# Patient Record
Sex: Male | Born: 1950 | Race: White | Hispanic: No | Marital: Married | Smoking: Former smoker
Health system: Southern US, Community
[De-identification: ages and names within clinical notes are randomized; demographics above are authoritative.]

## PROBLEM LIST (undated history)

## (undated) DIAGNOSIS — E785 Hyperlipidemia, unspecified: Secondary | ICD-10-CM

## (undated) DIAGNOSIS — E119 Type 2 diabetes mellitus without complications: Secondary | ICD-10-CM

## (undated) DIAGNOSIS — N200 Calculus of kidney: Secondary | ICD-10-CM

## (undated) DIAGNOSIS — I872 Venous insufficiency (chronic) (peripheral): Secondary | ICD-10-CM

## (undated) DIAGNOSIS — I10 Essential (primary) hypertension: Secondary | ICD-10-CM

## (undated) DIAGNOSIS — E669 Obesity, unspecified: Secondary | ICD-10-CM

## (undated) DIAGNOSIS — R55 Syncope and collapse: Secondary | ICD-10-CM

## (undated) DIAGNOSIS — I350 Nonrheumatic aortic (valve) stenosis: Secondary | ICD-10-CM

## (undated) DIAGNOSIS — M1990 Unspecified osteoarthritis, unspecified site: Secondary | ICD-10-CM

## (undated) DIAGNOSIS — I2510 Atherosclerotic heart disease of native coronary artery without angina pectoris: Secondary | ICD-10-CM

## (undated) DIAGNOSIS — G4730 Sleep apnea, unspecified: Secondary | ICD-10-CM

## (undated) HISTORY — PX: CATARACT EXTRACTION, BILATERAL: SHX1313

## (undated) HISTORY — DX: Unspecified osteoarthritis, unspecified site: M19.90

## (undated) HISTORY — DX: Obesity, unspecified: E66.9

## (undated) HISTORY — DX: Type 2 diabetes mellitus without complications: E11.9

## (undated) HISTORY — PX: APPENDECTOMY: SHX54

## (undated) HISTORY — DX: Venous insufficiency (chronic) (peripheral): I87.2

## (undated) HISTORY — DX: Hyperlipidemia, unspecified: E78.5

## (undated) HISTORY — DX: Sleep apnea, unspecified: G47.30

## (undated) HISTORY — PX: VASECTOMY REVERSAL: SHX243

## (undated) HISTORY — PX: VASECTOMY: SHX75

## (undated) HISTORY — DX: Calculus of kidney: N20.0

## (undated) HISTORY — DX: Nonrheumatic aortic (valve) stenosis: I35.0

## (undated) HISTORY — DX: Essential (primary) hypertension: I10

---

## 2003-07-17 ENCOUNTER — Other Ambulatory Visit: Payer: Self-pay

## 2004-03-18 ENCOUNTER — Ambulatory Visit: Payer: Self-pay | Admitting: Ophthalmology

## 2004-05-06 ENCOUNTER — Ambulatory Visit: Payer: Self-pay | Admitting: Ophthalmology

## 2005-06-04 ENCOUNTER — Ambulatory Visit: Payer: Self-pay | Admitting: Urology

## 2007-06-03 ENCOUNTER — Ambulatory Visit: Payer: Self-pay | Admitting: Urology

## 2007-06-09 ENCOUNTER — Other Ambulatory Visit: Payer: Self-pay

## 2007-06-09 ENCOUNTER — Ambulatory Visit: Payer: Self-pay | Admitting: Urology

## 2007-06-10 ENCOUNTER — Ambulatory Visit: Payer: Self-pay | Admitting: Urology

## 2007-06-18 ENCOUNTER — Ambulatory Visit: Payer: Self-pay | Admitting: Urology

## 2007-11-26 ENCOUNTER — Ambulatory Visit: Payer: Self-pay | Admitting: Urology

## 2008-04-07 ENCOUNTER — Ambulatory Visit: Payer: Self-pay | Admitting: Internal Medicine

## 2008-06-02 ENCOUNTER — Ambulatory Visit: Payer: Self-pay | Admitting: Internal Medicine

## 2008-09-08 ENCOUNTER — Ambulatory Visit: Payer: Self-pay | Admitting: Urology

## 2009-03-23 ENCOUNTER — Ambulatory Visit: Payer: Self-pay | Admitting: Unknown Physician Specialty

## 2010-03-08 ENCOUNTER — Ambulatory Visit: Payer: Self-pay | Admitting: Urology

## 2011-03-07 ENCOUNTER — Ambulatory Visit: Payer: Self-pay | Admitting: Internal Medicine

## 2012-01-20 ENCOUNTER — Encounter: Payer: Self-pay | Admitting: Cardiovascular Disease

## 2012-01-20 ENCOUNTER — Ambulatory Visit (INDEPENDENT_AMBULATORY_CARE_PROVIDER_SITE_OTHER): Payer: 59 | Admitting: Cardiovascular Disease

## 2012-01-20 VITALS — BP 162/88 | HR 78 | Ht 72.0 in | Wt 282.5 lb

## 2012-01-20 DIAGNOSIS — I350 Nonrheumatic aortic (valve) stenosis: Secondary | ICD-10-CM | POA: Insufficient documentation

## 2012-01-20 DIAGNOSIS — Z5181 Encounter for therapeutic drug level monitoring: Secondary | ICD-10-CM

## 2012-01-20 DIAGNOSIS — Z01810 Encounter for preprocedural cardiovascular examination: Secondary | ICD-10-CM

## 2012-01-20 DIAGNOSIS — R0789 Other chest pain: Secondary | ICD-10-CM

## 2012-01-20 DIAGNOSIS — Z01818 Encounter for other preprocedural examination: Secondary | ICD-10-CM

## 2012-01-20 DIAGNOSIS — I359 Nonrheumatic aortic valve disorder, unspecified: Secondary | ICD-10-CM

## 2012-01-20 DIAGNOSIS — I209 Angina pectoris, unspecified: Secondary | ICD-10-CM | POA: Insufficient documentation

## 2012-01-20 MED ORDER — PREDNISONE 50 MG PO TABS: ORAL_TABLET | ORAL | Status: DC

## 2012-01-20 NOTE — Patient Instructions (Addendum)
Your physician has requested that you have a cardiac catheterization. Cardiac catheterization is used to diagnose and/or treat various heart conditions. Doctors may recommend this procedure for a number of different reasons. The most common reason is to evaluate chest pain. Chest pain can be a symptom of coronary artery disease (CAD), and cardiac catheterization can show whether plaque is narrowing or blocking your heart's arteries. This procedure is also used to evaluate the valves, as well as measure the blood flow and oxygen levels in different parts of your heart. For further information please visit [email or web address] Please follow instruction sheet, as given.  [facility] Cardiac Cath Instructions   You are scheduled for a Cardiac Cath on: Friday Dec. 6th.  Please arrive at __9:45am_____am on the day of your procedure  You will need to pre-register prior to the day of your procedure.  Enter through the [employer] at [facility].  Registration is the first desk on your right.  Please take the procedure order we have given you in order to be registered appropriately  Do not eat/drink anything after midnight  Someone will need to drive you home  It is recommended someone be with you for the first 24 hours after your procedure  Wear clothes that are easy to get on/off and wear slip on shoes if possible   Medications bring a current list of all medications with you  _x__ You may take all of your medications the morning of your procedure with enough water to swallow safely except for the following: see below instructions.  _x__ Do not take these medications before your procedure:_Do not take the (glimepiride) amaryl the am of procedure. Take 1/2 your insulin dose the am of procedure. Do not take metformin the day before or 48 hours after the procedure.   Day of your procedure: Arrive at the [facility] entrance.  Free valet service is available.  After entering the [facility] you will pass on the  right admitting/registration desk, proceed pass the Cardiopulmonary desk to the first open hallway on your right, overhead you will see a sign reading Cardiac Cath./Special Procedures Waiting Room.  After taking the hallway, the waiting room is on the left, once you enter the waiting room, there is a wall phone on the left, just inside the door.  Dial [phone] this will connect you to the Special Recovery Nurse's Station; let them know you have arrived.  The usual length of stay after your procedure is about 2 to 3 hours.  This can vary.  If you have any questions, please call our office at [phone], or you may call the cardiac cath lab at [facility] directly at [phone]

## 2012-01-20 NOTE — Assessment & Plan Note (Signed)
This was mild by echocardiogram early this year. Also the it does not seem to be significant by physical exam.

## 2012-01-20 NOTE — Assessment & Plan Note (Signed)
The patient's symptoms of exertional chest pain and dyspnea with minimal activities are suggestive of class III angina in spite of being on aggressive antianginal medications including full dose carvedilol as well as amlodipine. Due to that, I recommend proceeding with cardiac catheterization and possible coronary intervention. Risks, benefits and alternatives were discussed with the patient. There is no contraindication for long-term dual antiplatelet therapy. He had previous allergic reaction to IV contrast with rash. Thus, I will pretreat him with prednisone.

## 2012-01-20 NOTE — Progress Notes (Signed)
primary care physician: Dr. Beverely Risen  HPI  [name] is a pleasant 61 year old Caucasian male who is here today for evaluation of exertional chest pain. The patient does not have any previous cardiac history. He has multiple chronic medical conditions including diabetes, hypertension, hyperlipidemia, obesity and erectile dysfunction. He also has sleep apnea. He started having exertional substernal chest pain associated with dyspnea about one year ago. However, his symptoms started progressing over the last few months. The patient describes substernal tightness feeling with minimal activities even walking to his mailbox which is not far associated with significant dyspnea which forces him to stop. The discomfort lasts for 5-10 minutes and improves with rest. He has been having increased palpitations as well in spite of being on maximal dose carvedilol. He had a recent Holter monitor done which showed PACs and PVCs without any other arrhythmia. He did have a nuclear stress test done in January of this year which showed fixed inferior wall defect without clear evidence of reversibility. An echocardiogram showed normal LV systolic function with mild aortic stenosis. The patient complains of cramping in his legs with walking it has no history of PAD. He reports being evaluated for this.  Allergies  Allergen Reactions  . Ivp Dye (Iodinated Diagnostic Agents)      Current Outpatient Prescriptions on File Prior to Visit  Medication Sig Dispense Refill  . amitriptyline (ELAVIL) 25 MG tablet Take 25 mg by mouth as directed.      Marland Kitchen amLODipine (NORVASC) 5 MG tablet Take 5 mg by mouth daily.      Marland Kitchen aspirin 81 MG tablet Take 81 mg by mouth daily.      Marland Kitchen atorvastatin (LIPITOR) 10 MG tablet Take 10 mg by mouth daily.      . Blood Glucose Monitoring Suppl (ONE TOUCH ULTRA SYSTEM KIT) W/DEVICE KIT 1 kit by Does not apply route 2 (two) times daily.      . carvedilol (COREG) 25 MG tablet Take 25 mg by mouth 2  (two) times daily with a meal.      . gabapentin (NEURONTIN) 100 MG capsule Take 100 mg by mouth at bedtime.      Marland Kitchen glimepiride (AMARYL) 4 MG tablet Take 4 mg by mouth 2 (two) times daily.      . insulin glargine (LANTUS) 100 UNIT/ML injection Inject 35 Units into the skin daily.      . Insulin Syringe-Needle U-100 (INSULIN SYRINGE .5CC/31GX5/16") 31G X 5/16" 0.5 ML MISC by Does not apply route 2 (two) times daily as needed.      Marland Kitchen losartan-hydrochlorothiazide (HYZAAR) 100-25 MG per tablet Take 1 tablet by mouth daily.      . NON FORMULARY CPAP to be used nightly with oxygen liter.      . Saxagliptin-Metformin (KOMBIGLYZE XR) 2.06-998 MG TB24 Take by mouth 2 (two) times daily.      Marland Kitchen testosterone cypionate (DEPOTESTOTERONE CYPIONATE) 200 MG/ML injection Inject 200 mg into the muscle every 7 (seven) days.         Past Medical History  Diagnosis Date  . Sleep apnea   . Arthritis   . Hyperlipidemia   . Hypertension   . Nephrolithiasis   . Heart murmur   . Diabetes mellitus without complication     Type II  . Aortic stenosis     Mild by echo  . Obesity      Past Surgical History  Procedure Date  . Vasectomy   . Vasectomy reversal   .  Appendectomy   . Cataract extraction, bilateral      Family History  Problem Relation Age of Onset  . Heart disease Mother   . Heart disease Brother      History   Social History  . Marital Status: Married    Spouse Name: N/A    Number of Children: N/A  . Years of Education: N/A   Occupational History  . Not on file.   Social History Main Topics  . Smoking status: Former Smoker -- 3.0 packs/day for 9 years    Types: Cigarettes  . Smokeless tobacco: Not on file  . Alcohol Use: No     Comment: SOCIAL  . Drug Use: No  . Sexually Active:    Other Topics Concern  . Not on file   Social History Narrative  . No narrative on file     ROS Constitutional: Negative for fever, chills, diaphoresis, activity change, appetite change.   HENT: Negative for hearing loss, nosebleeds, congestion, sore throat, facial swelling, drooling, trouble swallowing, neck pain, voice change, sinus pressure and tinnitus.  Eyes: Negative for photophobia, pain, discharge and visual disturbance.  Respiratory: Negative for apnea, cough  and wheezing.  Cardiovascular: Negative for leg swelling.  Gastrointestinal: Negative for nausea, vomiting, abdominal pain, diarrhea, constipation, blood in stool and abdominal distention.  Genitourinary: Negative for dysuria, urgency, frequency, hematuria and decreased urine volume.  Musculoskeletal: Negative for myalgias, back pain, joint swelling, arthralgias and gait problem.  Skin: Negative for color change, pallor, rash and wound.  Neurological: Negative for dizziness, tremors, seizures, syncope, speech difficulty, weakness, light-headedness, numbness and headaches.  Psychiatric/Behavioral: Negative for suicidal ideas, hallucinations, behavioral problems and agitation. The patient is not nervous/anxious.     PHYSICAL EXAM   BP 162/88  Pulse 78  Ht 6' (1.829 m)  Wt 282 lb 8 oz (128.141 kg)  BMI 38.31 kg/m2 Constitutional: He is oriented to person, place, and time. He appears well-developed and well-nourished. No distress.  HENT: No nasal discharge.  Head: Normocephalic and atraumatic.  Eyes: Pupils are equal and round. Right eye exhibits no discharge. Left eye exhibits no discharge.  Neck: Normal range of motion. Neck supple. No JVD present. No thyromegaly present.  Cardiovascular: Normal rate, regular rhythm, normal heart sounds and. Exam reveals no gallop and no friction rub. There is a 2/6 systolic ejection murmur at the aortic area which is early peaking with preserved S2 Pulmonary/Chest: Effort normal and breath sounds normal. No stridor. No respiratory distress. He has no wheezes. He has no rales. He exhibits no tenderness.  Abdominal: Soft. Bowel sounds are normal. He exhibits no distension.  There is no tenderness. There is no rebound and no guarding.  Musculoskeletal: Normal range of motion. He exhibits no edema and no tenderness.  Neurological: He is alert and oriented to person, place, and time. Coordination normal.  Skin: Skin is warm and dry. No rash noted. He is not diaphoretic. No erythema. No pallor.  Psychiatric: He has a normal mood and affect. His behavior is normal. Judgment and thought content normal.       EKG: Sinus  Rhythm  Low voltage in precordial leads.   Voltage criteria for LVH    ABNORMAL    ASSESSMENT AND PLAN

## 2012-01-21 LAB — CBC WITH DIFFERENTIAL/PLATELET
Basophils Absolute: 0 10*3/uL (ref 0.0–0.2)
Hemoglobin: 12.9 g/dL (ref 12.6–17.7)
Lymphs: 28 % (ref 14–46)
MCHC: 35.7 g/dL (ref 31.5–35.7)
Monocytes: 8 % (ref 4–12)
Neutrophils Absolute: 4.1 10*3/uL (ref 1.4–7.0)
RBC: 4.33 x10E6/uL (ref 4.14–5.80)

## 2012-01-21 LAB — BASIC METABOLIC PANEL
BUN: 20 mg/dL (ref 8–27)
CO2: 23 mmol/L (ref 19–28)
Calcium: 9.3 mg/dL (ref 8.6–10.2)
Chloride: 97 mmol/L (ref 97–108)
GFR calc Af Amer: 66 mL/min/{1.73_m2} (ref >59)
Glucose: 152 mg/dL — ABNORMAL HIGH (ref 65–99)

## 2012-01-21 LAB — PROTIME-INR
INR: 1.1 (ref 0.8–1.2)
Prothrombin Time: 11.2 s (ref 9.1–12.0)

## 2012-01-22 ENCOUNTER — Telehealth: Payer: Self-pay

## 2012-01-22 NOTE — Telephone Encounter (Signed)
Message copied by Marcelle Overlie on Thu Jan 22, 2012  9:55 AM ------      Message from: Lorine Bears A      Created: Wed Jan 21, 2012  5:10 PM      Regarding: RE: cath friday       That is ok. Will hydrate him. He is a big guy and creatinine might be up a little.       ----- Message -----         From: Marcelle Overlie, RN         Sent: 01/21/2012   8:46 AM           To: Iran Ouch, MD      Subject: cath friday                                              Creatinine 1.32      Is this ok?

## 2012-01-23 ENCOUNTER — Ambulatory Visit: Payer: Self-pay | Admitting: Cardiovascular Disease

## 2012-01-23 ENCOUNTER — Other Ambulatory Visit: Payer: Self-pay

## 2012-01-23 DIAGNOSIS — I2510 Atherosclerotic heart disease of native coronary artery without angina pectoris: Secondary | ICD-10-CM

## 2012-01-23 HISTORY — PX: CARDIAC CATHETERIZATION: SHX172

## 2012-01-27 ENCOUNTER — Other Ambulatory Visit: Payer: Self-pay | Admitting: Surgery

## 2012-01-27 ENCOUNTER — Other Ambulatory Visit (INDEPENDENT_AMBULATORY_CARE_PROVIDER_SITE_OTHER): Payer: 59

## 2012-01-27 ENCOUNTER — Encounter: Payer: Self-pay | Admitting: Surgery

## 2012-01-27 ENCOUNTER — Institutional Professional Consult (permissible substitution) (INDEPENDENT_AMBULATORY_CARE_PROVIDER_SITE_OTHER): Payer: 59 | Admitting: Surgery

## 2012-01-27 ENCOUNTER — Other Ambulatory Visit: Payer: Self-pay

## 2012-01-27 VITALS — BP 127/73 | HR 105 | Resp 16 | Ht 72.0 in | Wt 280.0 lb

## 2012-01-27 DIAGNOSIS — I2510 Atherosclerotic heart disease of native coronary artery without angina pectoris: Secondary | ICD-10-CM

## 2012-01-27 DIAGNOSIS — R0602 Shortness of breath: Secondary | ICD-10-CM

## 2012-01-28 ENCOUNTER — Encounter: Payer: Self-pay | Admitting: Surgery

## 2012-01-28 ENCOUNTER — Other Ambulatory Visit: Payer: Self-pay | Admitting: *Deleted

## 2012-01-28 ENCOUNTER — Encounter (HOSPITAL_COMMUNITY): Payer: Self-pay | Admitting: Respiratory Therapy

## 2012-01-28 DIAGNOSIS — I2510 Atherosclerotic heart disease of native coronary artery without angina pectoris: Secondary | ICD-10-CM

## 2012-01-28 NOTE — Progress Notes (Signed)
                  [address]            [place] [address]          [phone]      PCP is KHAN, FOZIA M, MD  Referring Provider is Arida, [name] A, MD  Chief Complaint   Patient presents with   .  Coronary Artery Disease     eval and treat, cathed at [facility] 01/23/12   HPI:  The patient is a 61-year-old gentleman with history of type 2 diabetes, hypertension, hyperlipidemia, and obesity who presented with an approximately one-year history of exertional substernal chest pain and shortness of breath. Over the past few months he has noticed worsening of his symptoms and is now having substernal chest pain with mild activity such as walking to his mailbox. He has not had any chest discomfort with normal activities inside his house or at rest. He had a nuclear stress test last January which showed a fixed inferior wall defect without reversibility. An echocardiogram showed normal left ventricular systolic function with mild aortic stenosis with mildly calcified leaflets. The AVA was 1.91 cm2 by VTI and 1.26 cm2 by Vmax. Mean gradient was 16 and peak gradient was 36. He recently underwent cardiac catheterization at [facility] which showed severe three-vessel coronary disease with an occluded mid right coronary artery supplied distally by collaterals.  Past Medical History   Diagnosis  Date   .  Sleep apnea: uses cpap with oxygen at night    .  Arthritis    .  Hyperlipidemia    .  Hypertension    .  Nephrolithiasis    .  Heart murmur    .  Diabetes mellitus without complication      Type II   .  Aortic stenosis      Mild by echo   .  Obesity     Past Surgical History   Procedure  Date   .  Vasectomy    .  Vasectomy reversal    .  Appendectomy    .  Cataract extraction, bilateral     Family History   Problem  Relation  Age of Onset   .  Heart disease  Mother    .  Heart disease  Brother    Social History  History   Substance Use Topics   .  Smoking  status:  Former Smoker -- 3.0 packs/day for 9 years     Types:  Cigarettes   .  Smokeless tobacco:  Never Used   .  Alcohol Use:  No      Comment: SOCIAL    Current Outpatient Prescriptions   Medication  Sig  Dispense  Refill   .  amitriptyline (ELAVIL) 25 MG tablet  Take 25 mg by mouth at bedtime.     .  amLODipine (NORVASC) 5 MG tablet  Take 5 mg by mouth daily.     .  aspirin 81 MG tablet  Take 81 mg by mouth daily.     .  atorvastatin (LIPITOR) 10 MG tablet  Take 10 mg by mouth daily.     .  carvedilol (COREG) 25 MG tablet  Take 25 mg by mouth 2 (two) times daily with a meal.     .  gabapentin (NEURONTIN) 100 MG capsule  Take 100 mg by mouth at bedtime.     .    glimepiride (AMARYL) 4 MG tablet  Take 4 mg by mouth 2 (two) times daily.     .  insulin glargine (LANTUS) 100 UNIT/ML injection  Inject 20-25 Units into the skin daily. Take 25 units in the morning and 20 units in the evening     .  losartan-hydrochlorothiazide (HYZAAR) 100-25 MG per tablet  Take 1 tablet by mouth daily.     .  NON FORMULARY  CPAP to be used nightly with oxygen @ 1L.     .  Saxagliptin-Metformin (KOMBIGLYZE XR) 2.06-998 MG TB24  Take by mouth 2 (two) times daily.      Allergies   Allergen  Reactions   .  Ivp Dye (Iodinated Diagnostic Agents)    Review of Systems  Constitutional: Positive for activity change and fatigue. Negative for unexpected weight change.  HENT: Negative.  Eyes:  Vision improved since cataract surgery.  Respiratory: Positive for apnea and shortness of breath.  Cardiovascular: Positive for chest pain, palpitations and leg swelling.  Gastrointestinal: Negative.  Genitourinary: Negative.  Musculoskeletal: Positive for arthralgias.  Skin: Negative.  Neurological: Negative.  Hematological: Negative.  Psychiatric/Behavioral: Negative.  BP 127/73  Pulse 105  Resp 16  Ht 6' (1.829 m)  Wt 280 lb (127.007 kg)  BMI 37.97 kg/m2  SpO2 93%  Physical Exam  Constitutional: He is oriented to  person, place, and time.  Obese white male in no distress.  HENT:  Head: Normocephalic and atraumatic.  Mouth/Throat: Oropharynx is clear and moist.  Eyes: Conjunctivae normal and EOM are normal. Pupils are equal, round, and reactive to light. No scleral icterus.  Neck: Normal range of motion. No JVD present. No tracheal deviation present. No thyromegaly present.  Cardiovascular: Normal rate, regular rhythm and intact distal pulses.  Murmur heard. 1/6 systolic murmur over aorta  Pulmonary/Chest: Effort normal and breath sounds normal. No respiratory distress. He has no wheezes. He has no rales.  Abdominal: Soft. Bowel sounds are normal. He exhibits no distension and no mass. There is no tenderness.  Musculoskeletal: Normal range of motion. He exhibits no edema.  Lymphadenopathy:  He has no cervical adenopathy.  Neurological: He is alert and oriented to person, place, and time. He has normal strength. No cranial nerve deficit or sensory deficit.  Skin: Skin is warm and dry.  Psychiatric: He has a normal mood and affect.  Diagnostic Tests:  *[facility]* [address] [place], [place] [address] [phone]  ------------------------------------------------------------ Transthoracic Echocardiography  Patient: Klostermann, Mohd MR #: [id number] Study Date: 01/27/2012 Gender: M Age: 61 Height: 182.9cm Weight: 127kg BSA: 2.46m^2 Pt. Status: Room:  ATTENDING Default, Provider W ORDERING Arida, [name] REFERRING Arida, [name] PERFORMING [place], [place] SONOGRAPHER Matthew LeBeau, RDCS REFERRING Arida cc:  ------------------------------------------------------------ LV EF: 60% - 65%  ------------------------------------------------------------ History: PMH: Patient had heart cath on 01-23-12, showed significant 3 vessel disease. Being worked up for CABG, unable to obtain good EF by cath echo requested. Acquired from the patient and from the patient's chart.  Dyspnea. Mild aortic stenosis.  ------------------------------------------------------------ Study Conclusions  - Left ventricle: The cavity size was normal. Wall thickness was increased in a pattern of moderate LVH. Systolic function was normal. The estimated ejection fraction was in the range of 60% to 65%. Wall motion was normal; there were no regional wall motion abnormalities. Left ventricular diastolic function parameters were normal. - Aortic valve: Calcified annulus. Trileaflet; normal thickness, mildly calcified leaflets. Cusp separation was mildly reduced. There was mild stenosis. Mean gradient: 16mm Hg (S).   Valve area: 1.91cm^2(VTI). - Mitral valve: Trivial regurgitation. - Left atrium: The atrium was mildly dilated. Transthoracic echocardiography. M-mode, complete 2D, spectral Doppler, and color Doppler. Height: Height: 182.9cm. Height: 72in. Weight: Weight: 127kg. Weight: 279.4lb. Body mass index: BMI: 38kg/m^2. Body surface area: BSA: 2.46m^2. Blood pressure: 165/95. Patient status: Outpatient.  ------------------------------------------------------------  ------------------------------------------------------------ Left ventricle: The cavity size was normal. Wall thickness was increased in a pattern of moderate LVH. Systolic function was normal. The estimated ejection fraction was in the range of 60% to 65%. Wall motion was normal; there were no regional wall motion abnormalities. The transmitral flow pattern was normal. The deceleration time of the early transmitral flow velocity was normal. The pulmonary vein flow pattern was normal. The tissue Doppler parameters were normal. Left ventricular diastolic function parameters were normal.  ------------------------------------------------------------ Aortic valve: Poorly visualized. Calcified annulus. Trileaflet; normal thickness, mildly calcified leaflets. Cusp separation was mildly reduced. Mobility was  not restricted. Doppler: There was mild stenosis. No regurgitation. VTI ratio of LVOT to aortic valve: 0.5. Valve area: 1.91cm^2(VTI). Indexed valve area: 0.78cm^2/m^2 (VTI). Peak velocity ratio of LVOT to aortic valve: 0.31. Valve area: 1.26cm^2 (Vmax). Indexed valve area: 0.51cm^2/m^2 (Vmax). Mean gradient: 16mm Hg (S). Peak gradient: 36mm Hg (S).  ------------------------------------------------------------ Aorta: Aortic root: The aortic root was normal in size. Ascending aorta: The ascending aorta was normal in size.  ------------------------------------------------------------ Mitral valve: Structurally normal valve. Mobility was not restricted. Doppler: Transvalvular velocity was within the normal range. There was no evidence for stenosis. Trivial regurgitation. Peak gradient: 3mm Hg (D).  ------------------------------------------------------------ Left atrium: The atrium was mildly dilated.  ------------------------------------------------------------ Right ventricle: The cavity size was normal. Wall thickness was normal. Systolic function was normal.  ------------------------------------------------------------ Pulmonic valve: Poorly visualized. Doppler: Transvalvular velocity was within the normal range. There was no evidence for stenosis.  ------------------------------------------------------------ Tricuspid valve: Poorly visualized. Structurally normal valve. Doppler: Transvalvular velocity was within the normal range. No regurgitation.  ------------------------------------------------------------ Pulmonary artery: The main pulmonary artery was normal-sized. Systolic pressure could not be accurately estimated.  ------------------------------------------------------------ Right atrium: The atrium was normal in size.  ------------------------------------------------------------ Pericardium: There was no pericardial  effusion.  ------------------------------------------------------------ Systemic veins: Inferior vena cava: The vessel was normal in size.  ------------------------------------------------------------  2D measurements Normal Doppler measurements Normal Left ventricle LVOT LVID ED, 36.5 mm 43-52 Peak vel, 91 cm/s ------ chord, S PLAX VTI, S 26 cm ------ LVID ES, 21.2 mm 23-38 Aortic valve chord, Peak vel, 291 cm/s ------ PLAX S FS, chord, 42 % >29 Mean vel, 167 cm/s ------ PLAX S LVPW, ED 14.7 mm ------ VTI, S 52.5 cm ------ IVS/LVPW 1.05 <1.3 Mean 16 mm Hg ------ ratio, ED gradient, Vol ED, 102 ml ------ S MOD1 Peak 36 mm Hg ------ Vol ES, 28 ml ------ gradient, MOD1 S EF, MOD1 73 % ------ VTI ratio 0.5 ------ Vol index, 41 ml/m^2 ------ LVOT/AV ED, MOD1 Area, VTI 1.91 cm^2 ------ Vol index, 11 ml/m^2 ------ Area index 0.78 cm^2/m ------ ES, MOD1 (VTI) ^2 Vol ED, 97 ml ------ Peak vel 0.31 ------ MOD2 ratio, Vol ES, 25 ml ------ LVOT/AV MOD2 Area, Vmax 1.26 cm^2 ------ EF, MOD2 74 % ------ Area index 0.51 cm^2/m ------ Stroke 72 ml ------ (Vmax) ^2 vol, MOD2 Mitral valve Vol index, 39 ml/m^2 ------ Peak E vel 81.4 cm/s ------ ED, MOD2 Peak A vel 68.1 cm/s ------ Vol index, 10 ml/m^2 ------ Decelerati 264 ms 150-23 ES, MOD2 on time 0 Stroke 29.3 ml/m^2 ------ Peak 3 mm Hg ------ index, gradient,   MOD2 D Ventricular septum Peak E/A 1.2 ------ IVS, ED 15.4 mm ------ ratio LVOT Pulmonic valve Diam, S 23 mm ------ Peak vel, 141 cm/s ------ Area 4.15 cm^2 ------ S Aorta Root diam, 33 mm ------ ED Left atrium AP dim 43 mm ------ AP dim 1.75 cm/m^2 <2.2 index Vol, S 24.1 ml ------ Vol index, 9.8 ml/m^2 ------ S Right ventricle RVID ED, 32 mm 19-38 PLAX  ------------------------------------------------------------ Prepared and Electronically Authenticated by  Arida, [name] 2013-12-10T13:11:25.203   Impression/Plan:   He has severe three-vessel coronary  disease with worsening exertional angina. He has mild aortic stenosis by echocardiogram. I agree that coronary bypass graft surgery is the best treatment to prevent further ischemia and infarction and improve his quality of life. His aortic stenosis is mild and I think that if we replaced his aortic valve at this time he may end up with the same or a higher gradient. I think the risk is higher than the benefit at this time. I discussed the possibility that he may have progressive aortic stenosis and require valve replacement in the future if this occurs we could consider TAVR. His chest x-ray reportedly shows some honeycombing of the lungs consistent with some interstitial lung disease. He is very functional at this point with minimal pulmonary symptoms and I don't think this is a contraindication to surgery. He has an appointment for pulmonary function testing and pulmonary medicine referral in January 2014. I discussed the operative procedure with the patient and family including alternatives, benefits and risks; including but not limited to bleeding, blood transfusion, infection, stroke, myocardial infarction, graft failure, heart block requiring a permanent pacemaker, organ dysfunction, and death. Shishir Prasad understands and agrees to proceed. We will schedule surgery for next Thursday.       72 ml     ------  (Vmax)          ^2 vol, MOD2                      Mitral valve Vol index,   39 ml/m^2 ------  Peak E vel 81.4 cm/s   ------ ED, MOD2                       Peak A vel 68.1 cm/s   ------ Vol index,   10 ml/m^2  ------  Decelerati  264 ms     150-23 ES, MOD2                       on time                0 Stroke     29.3 ml/m^2 ------  Peak          3 mm Hg  ------ index,                         gradient, MOD2                           D Ventricular septum             Peak E/A    1.2        ------ IVS, ED    15.4 mm     ------  ratio LVOT                           Pulmonic valve Diam, S      23 mm     ------  Peak vel,   141 cm/s   ------ Area       4.15 cm^2   ------  S Aorta Root diam,   33 mm     ------ ED Left atrium AP dim       43 mm     ------ AP dim     1.75 cm/m^2 <2.2 index Vol, S     24.1 ml     ------ Vol index,  9.8 ml/m^2 ------ S Right ventricle RVID ED,     32 mm     19-38 PLAX   ------------------------------------------------------------ Prepared and Electronically Authenticated by  Lorine Bears 2013-12-10T13:11:25.203   Impression/Plan:  He has severe three-vessel coronary disease with worsening exertional angina. He has mild aortic stenosis by echocardiogram. I agree that coronary bypass graft surgery is the best treatment to prevent further ischemia and infarction and improve his quality of life. His aortic stenosis is mild and I think that if we replaced his aortic valve at this time he may end up with the same or a higher gradient. I think the risk is higher than the benefit at this time. I discussed the possibility that he may have progressive aortic stenosis and require valve replacement in the future if this occurs we could consider TAVR. His chest x-ray reportedly shows some honeycombing of the lungs consistent with some interstitial lung disease. He is very functional at this point with minimal pulmonary symptoms and I don't think this is a contraindication to surgery. He has an appointment for pulmonary function testing and pulmonary medicine referral in January 2014. I discussed the operative procedure with the patient and family including  alternatives,  benefits and risks; including but not limited to bleeding, blood transfusion, infection, stroke, myocardial infarction, graft failure, heart block requiring a permanent pacemaker, organ dysfunction, and death.  Otis Peak understands and agrees to proceed.  We will schedule surgery for next Thursday.

## 2012-01-29 ENCOUNTER — Ambulatory Visit: Payer: Self-pay | Admitting: Cardiovascular Disease

## 2012-02-03 ENCOUNTER — Encounter (HOSPITAL_COMMUNITY)
Admission: RE | Admit: 2012-02-03 | Discharge: 2012-02-03 | Disposition: A | Discharge status: Home / Self Care | Payer: 59 | Source: Ambulatory Visit | Attending: Surgery | Admitting: Surgery

## 2012-02-03 ENCOUNTER — Telehealth: Payer: Self-pay | Admitting: *Deleted

## 2012-02-03 ENCOUNTER — Ambulatory Visit (HOSPITAL_COMMUNITY): Admission: RE | Admit: 2012-02-03 | Payer: 59 | Source: Ambulatory Visit

## 2012-02-03 ENCOUNTER — Ambulatory Visit (HOSPITAL_COMMUNITY)
Admission: RE | Admit: 2012-02-03 | Discharge: 2012-02-03 | Disposition: A | Discharge status: Home / Self Care | Payer: 59 | Source: Ambulatory Visit | Attending: Surgery | Admitting: Surgery

## 2012-02-03 ENCOUNTER — Encounter (HOSPITAL_COMMUNITY): Admission: RE | Admit: 2012-02-03 | Payer: 59 | Source: Ambulatory Visit

## 2012-02-03 ENCOUNTER — Encounter (HOSPITAL_COMMUNITY): Payer: Self-pay

## 2012-02-03 VITALS — BP 148/73 | HR 89 | Temp 98.7°F | Resp 20 | Ht 72.0 in | Wt 277.9 lb

## 2012-02-03 DIAGNOSIS — I359 Nonrheumatic aortic valve disorder, unspecified: Secondary | ICD-10-CM | POA: Insufficient documentation

## 2012-02-03 DIAGNOSIS — Z01810 Encounter for preprocedural cardiovascular examination: Secondary | ICD-10-CM

## 2012-02-03 DIAGNOSIS — I2510 Atherosclerotic heart disease of native coronary artery without angina pectoris: Secondary | ICD-10-CM

## 2012-02-03 DIAGNOSIS — I350 Nonrheumatic aortic (valve) stenosis: Secondary | ICD-10-CM

## 2012-02-03 HISTORY — DX: Atherosclerotic heart disease of native coronary artery without angina pectoris: I25.10

## 2012-02-03 LAB — BLOOD GAS, ARTERIAL
Bicarbonate: 23.7 mEq/L (ref 20.0–24.0)
O2 Saturation: 94.4 %
Patient temperature: 98.6
TCO2: 24.8 mmol/L (ref 0–100)

## 2012-02-03 LAB — PULMONARY FUNCTION TEST

## 2012-02-03 LAB — CBC
HCT: 36.6 % — ABNORMAL LOW (ref 39.0–52.0)
Hemoglobin: 12.4 g/dL — ABNORMAL LOW (ref 13.0–17.0)
MCHC: 33.9 g/dL (ref 30.0–36.0)

## 2012-02-03 LAB — COMPREHENSIVE METABOLIC PANEL
Alkaline Phosphatase: 52 U/L (ref 39–117)
BUN: 19 mg/dL (ref 6–23)
GFR calc Af Amer: 68 mL/min — ABNORMAL LOW (ref >90)
GFR calc non Af Amer: 58 mL/min — ABNORMAL LOW (ref >90)
Glucose, Bld: 151 mg/dL — ABNORMAL HIGH (ref 70–99)
Potassium: 4.7 mEq/L (ref 3.5–5.1)
Total Bilirubin: 0.4 mg/dL (ref 0.3–1.2)
Total Protein: 7.8 g/dL (ref 6.0–8.3)

## 2012-02-03 LAB — URINALYSIS, ROUTINE W REFLEX MICROSCOPIC
Bilirubin Urine: NEGATIVE
Hgb urine dipstick: NEGATIVE
Ketones, ur: NEGATIVE mg/dL
Protein, ur: 30 mg/dL — AB
Urobilinogen, UA: 0.2 mg/dL (ref 0.0–1.0)

## 2012-02-03 LAB — PROTIME-INR: INR: 1.02 (ref 0.00–1.49)

## 2012-02-03 LAB — URINE MICROSCOPIC-ADD ON

## 2012-02-03 LAB — SURGICAL PCR SCREEN: MRSA, PCR: NEGATIVE

## 2012-02-03 MED ORDER — CHLORHEXIDINE GLUCONATE 4 % EX LIQD: 30.0000 mL | CUTANEOUS | Status: DC

## 2012-02-03 MED ORDER — ALBUTEROL SULFATE (5 MG/ML) 0.5% IN NEBU
2.5000 mg | INHALATION_SOLUTION | Freq: Once | RESPIRATORY_TRACT | Status: AC
  Administered 2012-02-03: 2.5 mg via RESPIRATORY_TRACT

## 2012-02-03 NOTE — Progress Notes (Signed)
Adam Ibarra staed she would call pt tonight for questions. Sleep study called for at [facility].Pfts and dopplers done with PAT visit.

## 2012-02-03 NOTE — Telephone Encounter (Signed)
Completed heart surgery education with patient and wife.  Answered all questions.  Told to call back if any other concerns come up.

## 2012-02-03 NOTE — Progress Notes (Signed)
VASCULAR LAB PRELIMINARY  PRELIMINARY  PRELIMINARY  PRELIMINARY  Pre-op Cardiac Surgery  Carotid Findings:  Bilateral:  No evidence of hemodynamically significant internal carotid artery stenosis.   Vertebral artery flow is antegrade.      Upper Extremity Right Left  Brachial Pressures 141 T 143 T  Radial Waveforms T T  Ulnar Waveforms T T  Palmar Arch (Allen's Test) * **   Findings:  *Right:  Doppler waveforms obliterate with ulnar and remain normal with radial compressions.  **Left:  Doppler waveforms remain normal with ulnar and radial compressions.    Lower  Extremity Right Left  Dorsalis Pedis    Anterior Tibial 102 M 135 DM  Posterior Tibial 138 M 157 M  Ankle/Brachial Indices 0.97 >1.0    Findings:  ABI is within normal limits with abnormal Doppler waveforms bilaterally.   Adam Ibarra, RVT 02/03/2012, 2:24 PM

## 2012-02-03 NOTE — Pre-Procedure Instructions (Signed)
20 Manu Rubey  02/03/2012   Your procedure is scheduled on:  02/05/12  Report to [facility] at 530 AM.  Call this number if you have problems the morning of surgery: [phone]   Remember:   Do not eat food:After Midnight.    Take these medicines the morning of surgery with A SIP OF WATER: norvasc,carvedilol,neurontin   Do not wear jewelry, make-up or nail polish.  Do not wear lotions, powders, or perfumes. You may wear deodorant.  Do not shave 48 hours prior to surgery. Men may shave face and neck.  Do not bring valuables to the hospital.  Contacts, dentures or bridgework may not be worn into surgery.  Leave suitcase in the car. After surgery it may be brought to your room.  For patients admitted to the hospital, checkout time is 11:00 AM the day of discharge.   Patients discharged the day of surgery will not be allowed to drive home.  Name and phone number of your driver: family  Special Instructions: Shower using CHG 2 nights before surgery and the night before surgery.  If you shower the day of surgery use CHG.  Use special wash - you have one bottle of CHG for all showers.  You should use approximately 1/3 of the bottle for each shower.   Please read over the following fact sheets that you were given: Pain Booklet, Coughing and Deep Breathing, Blood Transfusion Information, Open Heart Packet, MRSA Information and Surgical Site Infection Prevention

## 2012-02-04 ENCOUNTER — Other Ambulatory Visit: Payer: Self-pay

## 2012-02-04 DIAGNOSIS — I25810 Atherosclerosis of coronary artery bypass graft(s) without angina pectoris: Secondary | ICD-10-CM

## 2012-02-04 LAB — HEMOGLOBIN A1C: Hgb A1c MFr Bld: 7.2 % — ABNORMAL HIGH (ref <5.7)

## 2012-02-04 NOTE — Progress Notes (Signed)
Echo, Stress and OV note received from [facility].  Placed in chart.

## 2012-02-04 NOTE — Progress Notes (Signed)
Called for pulmonary function test,stated they will send.

## 2012-02-04 NOTE — Progress Notes (Signed)
NOTIFIED PATIENT OF DATE CHANGE, PATIENT NOW TO ARRIVE AT 0530 AM ON 02/06/12.

## 2012-02-05 MED ORDER — DOPAMINE-DEXTROSE 3.2-5 MG/ML-% IV SOLN
2.0000 ug/kg/min | INTRAVENOUS | Status: DC
  Filled 2012-02-05: qty 250

## 2012-02-05 MED ORDER — EPINEPHRINE HCL 1 MG/ML IJ SOLN
0.5000 ug/min | INTRAMUSCULAR | Status: DC
  Filled 2012-02-05: qty 4

## 2012-02-05 MED ORDER — DEXTROSE 5 % IV SOLN
1.5000 g | INTRAVENOUS | Status: AC
  Administered 2012-02-06: 1.5 g via INTRAVENOUS
  Administered 2012-02-06: .75 g via INTRAVENOUS
  Filled 2012-02-05 (×2): qty 1.5

## 2012-02-05 MED ORDER — MAGNESIUM SULFATE 50 % IJ SOLN
40.0000 meq | INTRAMUSCULAR | Status: DC
  Filled 2012-02-05: qty 10

## 2012-02-05 MED ORDER — DEXMEDETOMIDINE HCL IN NACL 400 MCG/100ML IV SOLN
0.1000 ug/kg/h | INTRAVENOUS | Status: AC
  Administered 2012-02-06: .3 ug/kg/h via INTRAVENOUS
  Filled 2012-02-05: qty 100

## 2012-02-05 MED ORDER — SODIUM CHLORIDE 0.9 % IV SOLN
INTRAVENOUS | Status: AC
  Administered 2012-02-06: 3.8 [IU]/h via INTRAVENOUS
  Filled 2012-02-05: qty 1

## 2012-02-05 MED ORDER — PLASMA-LYTE 148 IV SOLN
INTRAVENOUS | Status: AC
  Administered 2012-02-06: 08:00:00
  Filled 2012-02-05: qty 2.5

## 2012-02-05 MED ORDER — VANCOMYCIN HCL 10 G IV SOLR
1500.0000 mg | INTRAVENOUS | Status: AC
  Administered 2012-02-06: 1500 mg via INTRAVENOUS
  Filled 2012-02-05 (×2): qty 1500

## 2012-02-05 MED ORDER — NITROGLYCERIN IN D5W 200-5 MCG/ML-% IV SOLN
2.0000 ug/min | INTRAVENOUS | Status: AC
  Administered 2012-02-06: 16 ug/min via INTRAVENOUS
  Filled 2012-02-05: qty 250

## 2012-02-05 MED ORDER — POTASSIUM CHLORIDE 2 MEQ/ML IV SOLN
80.0000 meq | INTRAVENOUS | Status: DC
  Filled 2012-02-05: qty 40

## 2012-02-05 MED ORDER — DEXTROSE 5 % IV SOLN
750.0000 mg | INTRAVENOUS | Status: DC
  Filled 2012-02-05: qty 750

## 2012-02-05 MED ORDER — PHENYLEPHRINE HCL 10 MG/ML IJ SOLN
30.0000 ug/min | INTRAVENOUS | Status: AC
  Administered 2012-02-06: 25 ug/min via INTRAVENOUS
  Filled 2012-02-05: qty 2

## 2012-02-05 MED ORDER — SODIUM CHLORIDE 0.9 % IV SOLN
INTRAVENOUS | Status: AC
  Administered 2012-02-06: 14 mL via INTRAVENOUS
  Administered 2012-02-06: 14 mL/h via INTRAVENOUS
  Filled 2012-02-05: qty 40

## 2012-02-06 ENCOUNTER — Inpatient Hospital Stay (HOSPITAL_COMMUNITY)
Admission: RE | Admit: 2012-02-06 | Discharge: 2012-02-13 | DRG: 221 | Disposition: A | Discharge status: Home / Self Care | Payer: 59 | Source: Ambulatory Visit | Attending: Surgery | Admitting: Surgery

## 2012-02-06 ENCOUNTER — Encounter (HOSPITAL_COMMUNITY)
Admission: RE | Disposition: A | Discharge status: Home / Self Care | Payer: Self-pay | Source: Ambulatory Visit | Attending: Surgery

## 2012-02-06 ENCOUNTER — Inpatient Hospital Stay (HOSPITAL_COMMUNITY): Payer: 59

## 2012-02-06 ENCOUNTER — Encounter (HOSPITAL_COMMUNITY): Payer: Self-pay | Admitting: Anesthesiology

## 2012-02-06 ENCOUNTER — Encounter (HOSPITAL_COMMUNITY): Payer: Self-pay | Admitting: *Deleted

## 2012-02-06 ENCOUNTER — Ambulatory Visit (HOSPITAL_COMMUNITY): Payer: 59 | Admitting: Anesthesiology

## 2012-02-06 DIAGNOSIS — E785 Hyperlipidemia, unspecified: Secondary | ICD-10-CM | POA: Diagnosis present

## 2012-02-06 DIAGNOSIS — I2510 Atherosclerotic heart disease of native coronary artery without angina pectoris: Secondary | ICD-10-CM

## 2012-02-06 DIAGNOSIS — I10 Essential (primary) hypertension: Secondary | ICD-10-CM | POA: Diagnosis present

## 2012-02-06 DIAGNOSIS — I359 Nonrheumatic aortic valve disorder, unspecified: Secondary | ICD-10-CM | POA: Diagnosis present

## 2012-02-06 DIAGNOSIS — Z794 Long term (current) use of insulin: Secondary | ICD-10-CM

## 2012-02-06 DIAGNOSIS — K5900 Constipation, unspecified: Secondary | ICD-10-CM | POA: Diagnosis present

## 2012-02-06 DIAGNOSIS — Z87891 Personal history of nicotine dependence: Secondary | ICD-10-CM

## 2012-02-06 DIAGNOSIS — Z952 Presence of prosthetic heart valve: Secondary | ICD-10-CM

## 2012-02-06 DIAGNOSIS — J8410 Pulmonary fibrosis, unspecified: Secondary | ICD-10-CM

## 2012-02-06 DIAGNOSIS — E119 Type 2 diabetes mellitus without complications: Secondary | ICD-10-CM | POA: Diagnosis present

## 2012-02-06 DIAGNOSIS — Z7982 Long term (current) use of aspirin: Secondary | ICD-10-CM

## 2012-02-06 DIAGNOSIS — G4733 Obstructive sleep apnea (adult) (pediatric): Secondary | ICD-10-CM | POA: Diagnosis present

## 2012-02-06 DIAGNOSIS — Z9981 Dependence on supplemental oxygen: Secondary | ICD-10-CM

## 2012-02-06 DIAGNOSIS — Z951 Presence of aortocoronary bypass graft: Secondary | ICD-10-CM

## 2012-02-06 DIAGNOSIS — Z01812 Encounter for preprocedural laboratory examination: Secondary | ICD-10-CM

## 2012-02-06 DIAGNOSIS — Z6838 Body mass index (BMI) 38.0-38.9, adult: Secondary | ICD-10-CM

## 2012-02-06 DIAGNOSIS — E669 Obesity, unspecified: Secondary | ICD-10-CM | POA: Diagnosis present

## 2012-02-06 DIAGNOSIS — I209 Angina pectoris, unspecified: Secondary | ICD-10-CM | POA: Diagnosis present

## 2012-02-06 HISTORY — PX: AORTIC VALVE REPLACEMENT: SHX41

## 2012-02-06 HISTORY — PX: CORONARY ARTERY BYPASS GRAFT: SHX141

## 2012-02-06 LAB — POCT I-STAT 3, ART BLOOD GAS (G3+)
Acid-Base Excess: 2 mmol/L (ref 0.0–2.0)
Acid-base deficit: 3 mmol/L — ABNORMAL HIGH (ref 0.0–2.0)
Acid-base deficit: 2 mmol/L (ref 0.0–2.0)
Acid-base deficit: 3 mmol/L — ABNORMAL HIGH (ref 0.0–2.0)
Bicarbonate: 23.3 mEq/L (ref 20.0–24.0)
O2 Saturation: 99 %
Patient temperature: 36.6
pCO2 arterial: 47.1 mmHg — ABNORMAL HIGH (ref 35.0–45.0)
pH, Arterial: 7.297 — ABNORMAL LOW (ref 7.350–7.450)
pH, Arterial: 7.352 (ref 7.350–7.450)
pO2, Arterial: 109 mmHg — ABNORMAL HIGH (ref 80.0–100.0)
pO2, Arterial: 140 mmHg — ABNORMAL HIGH (ref 80.0–100.0)

## 2012-02-06 LAB — CBC
HCT: 26 % — ABNORMAL LOW (ref 39.0–52.0)
MCH: 28.8 pg (ref 26.0–34.0)
MCV: 84.1 fL (ref 78.0–100.0)
Platelets: 99 10*3/uL — ABNORMAL LOW (ref 150–400)
Platelets: 106 10*3/uL — ABNORMAL LOW (ref 150–400)
RBC: 2.6 MIL/uL — ABNORMAL LOW (ref 4.22–5.81)
RBC: 3.09 MIL/uL — ABNORMAL LOW (ref 4.22–5.81)
RDW: 13.2 % (ref 11.5–15.5)
RDW: 13.3 % (ref 11.5–15.5)
WBC: 13 10*3/uL — ABNORMAL HIGH (ref 4.0–10.5)
WBC: 12.6 10*3/uL — ABNORMAL HIGH (ref 4.0–10.5)

## 2012-02-06 LAB — PREPARE RBC (CROSSMATCH)

## 2012-02-06 LAB — POCT I-STAT 4, (NA,K, GLUC, HGB,HCT)
Glucose, Bld: 142 mg/dL — ABNORMAL HIGH (ref 70–99)
Glucose, Bld: 165 mg/dL — ABNORMAL HIGH (ref 70–99)
Glucose, Bld: 163 mg/dL — ABNORMAL HIGH (ref 70–99)
HCT: 27 % — ABNORMAL LOW (ref 39.0–52.0)
HCT: 24 % — ABNORMAL LOW (ref 39.0–52.0)
HCT: 26 % — ABNORMAL LOW (ref 39.0–52.0)
Hemoglobin: 9.2 g/dL — ABNORMAL LOW (ref 13.0–17.0)
Hemoglobin: 8.2 g/dL — ABNORMAL LOW (ref 13.0–17.0)
Hemoglobin: 8.8 g/dL — ABNORMAL LOW (ref 13.0–17.0)
Potassium: 4.2 mEq/L (ref 3.5–5.1)
Potassium: 4.2 mEq/L (ref 3.5–5.1)
Potassium: 4.7 mEq/L (ref 3.5–5.1)
Potassium: 4.1 mEq/L (ref 3.5–5.1)
Sodium: 138 mEq/L (ref 135–145)
Sodium: 139 mEq/L (ref 135–145)

## 2012-02-06 LAB — POCT I-STAT, CHEM 8
Calcium, Ion: 1.13 mmol/L (ref 1.13–1.30)
Glucose, Bld: 123 mg/dL — ABNORMAL HIGH (ref 70–99)
HCT: 25 % — ABNORMAL LOW (ref 39.0–52.0)
Hemoglobin: 8.5 g/dL — ABNORMAL LOW (ref 13.0–17.0)
TCO2: 22 mmol/L (ref 0–100)

## 2012-02-06 LAB — PROTIME-INR: Prothrombin Time: 17.2 seconds — ABNORMAL HIGH (ref 11.6–15.2)

## 2012-02-06 LAB — GLUCOSE, CAPILLARY: Glucose-Capillary: 192 mg/dL — ABNORMAL HIGH (ref 70–99)

## 2012-02-06 LAB — HEMOGLOBIN AND HEMATOCRIT, BLOOD: HCT: 20.9 % — ABNORMAL LOW (ref 39.0–52.0)

## 2012-02-06 LAB — CREATININE, SERUM
Creatinine, Ser: 1.37 mg/dL — ABNORMAL HIGH (ref 0.50–1.35)
GFR calc Af Amer: 62 mL/min — ABNORMAL LOW (ref >90)
GFR calc non Af Amer: 54 mL/min — ABNORMAL LOW (ref >90)

## 2012-02-06 SURGERY — CORONARY ARTERY BYPASS GRAFTING (CABG)
Anesthesia: General | Site: Chest | Wound class: Clean

## 2012-02-06 MED ORDER — MAGNESIUM SULFATE 40 MG/ML IJ SOLN
4.0000 g | Freq: Once | INTRAMUSCULAR | Status: AC
  Administered 2012-02-06: 4 g via INTRAVENOUS
  Filled 2012-02-06: qty 100

## 2012-02-06 MED ORDER — PHENYLEPHRINE HCL 10 MG/ML IJ SOLN
0.0000 ug/min | INTRAVENOUS | Status: DC
  Filled 2012-02-06 (×2): qty 2

## 2012-02-06 MED ORDER — FAMOTIDINE IN NACL 20-0.9 MG/50ML-% IV SOLN
20.0000 mg | Freq: Two times a day (BID) | INTRAVENOUS | Status: AC
  Administered 2012-02-06: 20 mg via INTRAVENOUS

## 2012-02-06 MED ORDER — SODIUM CHLORIDE 0.9 % IV SOLN
INTRAVENOUS | Status: DC
  Administered 2012-02-06: 0.8 [IU]/h via INTRAVENOUS
  Administered 2012-02-07: 09:00:00 via INTRAVENOUS
  Administered 2012-02-07 (×2): 4.6 [IU]/h via INTRAVENOUS
  Filled 2012-02-06 (×3): qty 1

## 2012-02-06 MED ORDER — SODIUM CHLORIDE 0.9 % IV SOLN
INTRAVENOUS | Status: DC | PRN
  Administered 2012-02-06: 08:00:00 via INTRAVENOUS

## 2012-02-06 MED ORDER — MIDAZOLAM HCL 2 MG/2ML IJ SOLN: 2.0000 mg | INTRAMUSCULAR | Status: DC | PRN

## 2012-02-06 MED ORDER — MIDAZOLAM HCL 5 MG/5ML IJ SOLN
INTRAMUSCULAR | Status: DC | PRN
  Administered 2012-02-06: 1 mg via INTRAVENOUS
  Administered 2012-02-06: 4 mg via INTRAVENOUS
  Administered 2012-02-06 (×2): 1 mg via INTRAVENOUS
  Administered 2012-02-06: 5 mg via INTRAVENOUS
  Administered 2012-02-06: 1 mg via INTRAVENOUS
  Administered 2012-02-06: 2 mg via INTRAVENOUS
  Administered 2012-02-06: 1 mg via INTRAVENOUS

## 2012-02-06 MED ORDER — METOPROLOL TARTRATE 1 MG/ML IV SOLN: 2.5000 mg | INTRAVENOUS | Status: DC | PRN

## 2012-02-06 MED ORDER — LACTATED RINGERS IV SOLN
INTRAVENOUS | Status: DC
  Administered 2012-02-06: 20 mL/h via INTRAVENOUS

## 2012-02-06 MED ORDER — CHLORHEXIDINE GLUCONATE CLOTH 2 % EX PADS
6.0000 | MEDICATED_PAD | Freq: Every day | CUTANEOUS | Status: DC
  Administered 2012-02-06 – 2012-02-08 (×3): 6 via TOPICAL

## 2012-02-06 MED ORDER — INSULIN REGULAR BOLUS VIA INFUSION
0.0000 [IU] | Freq: Three times a day (TID) | INTRAVENOUS | Status: DC
  Filled 2012-02-06: qty 10

## 2012-02-06 MED ORDER — SODIUM CHLORIDE 0.9 % IJ SOLN
3.0000 mL | INTRAMUSCULAR | Status: DC | PRN
  Administered 2012-02-08: 3 mL via INTRAVENOUS

## 2012-02-06 MED ORDER — SODIUM CHLORIDE 0.9 % IJ SOLN
3.0000 mL | Freq: Two times a day (BID) | INTRAMUSCULAR | Status: DC
  Administered 2012-02-07 – 2012-02-10 (×4): 3 mL via INTRAVENOUS

## 2012-02-06 MED ORDER — LACTATED RINGERS IV SOLN
INTRAVENOUS | Status: DC | PRN
  Administered 2012-02-06 (×2): via INTRAVENOUS

## 2012-02-06 MED ORDER — SODIUM CHLORIDE 0.9 % IV SOLN
INTRAVENOUS | Status: DC
  Administered 2012-02-06: 20 mL/h via INTRAVENOUS
  Administered 2012-02-07: 12:00:00 via INTRAVENOUS

## 2012-02-06 MED ORDER — NITROGLYCERIN IN D5W 200-5 MCG/ML-% IV SOLN
0.0000 ug/min | INTRAVENOUS | Status: DC
  Administered 2012-02-06: 0 ug/min via INTRAVENOUS

## 2012-02-06 MED ORDER — DEXTROSE 5 % IV SOLN
1.5000 g | Freq: Two times a day (BID) | INTRAVENOUS | Status: AC
  Administered 2012-02-06 – 2012-02-08 (×4): 1.5 g via INTRAVENOUS
  Filled 2012-02-06 (×4): qty 1.5

## 2012-02-06 MED ORDER — OXYCODONE HCL 5 MG PO TABS
5.0000 mg | ORAL_TABLET | ORAL | Status: DC | PRN
  Administered 2012-02-07: 5 mg via ORAL
  Filled 2012-02-06: qty 1

## 2012-02-06 MED ORDER — ASPIRIN EC 325 MG PO TBEC
325.0000 mg | DELAYED_RELEASE_TABLET | Freq: Every day | ORAL | Status: DC
  Administered 2012-02-07 – 2012-02-08 (×2): 325 mg via ORAL
  Filled 2012-02-06 (×2): qty 1

## 2012-02-06 MED ORDER — ACETAMINOPHEN 10 MG/ML IV SOLN
1000.0000 mg | Freq: Once | INTRAVENOUS | Status: AC
  Administered 2012-02-06: 1000 mg via INTRAVENOUS
  Filled 2012-02-06: qty 100

## 2012-02-06 MED ORDER — DEXMEDETOMIDINE HCL IN NACL 400 MCG/100ML IV SOLN
0.1000 ug/kg/h | INTRAVENOUS | Status: DC
  Administered 2012-02-06: 0.7 ug/kg/h via INTRAVENOUS
  Filled 2012-02-06: qty 100

## 2012-02-06 MED ORDER — ONDANSETRON HCL 4 MG/2ML IJ SOLN
4.0000 mg | Freq: Four times a day (QID) | INTRAMUSCULAR | Status: DC | PRN
  Administered 2012-02-08: 4 mg via INTRAVENOUS
  Filled 2012-02-06: qty 2

## 2012-02-06 MED ORDER — ROCURONIUM BROMIDE 100 MG/10ML IV SOLN
INTRAVENOUS | Status: DC | PRN
  Administered 2012-02-06 (×2): 50 mg via INTRAVENOUS

## 2012-02-06 MED ORDER — LACTATED RINGERS IV SOLN
INTRAVENOUS | Status: DC | PRN
  Administered 2012-02-06: 07:00:00 via INTRAVENOUS

## 2012-02-06 MED ORDER — ALBUMIN HUMAN 5 % IV SOLN
250.0000 mL | INTRAVENOUS | Status: AC | PRN
  Administered 2012-02-06: 250 mL via INTRAVENOUS

## 2012-02-06 MED ORDER — METOPROLOL TARTRATE 25 MG/10 ML ORAL SUSPENSION
12.5000 mg | Freq: Two times a day (BID) | ORAL | Status: DC
  Filled 2012-02-06 (×5): qty 5

## 2012-02-06 MED ORDER — GABAPENTIN 100 MG PO CAPS
100.0000 mg | ORAL_CAPSULE | Freq: Every day | ORAL | Status: DC
  Administered 2012-02-07 – 2012-02-12 (×6): 100 mg via ORAL
  Filled 2012-02-06 (×9): qty 1

## 2012-02-06 MED ORDER — PANTOPRAZOLE SODIUM 40 MG PO TBEC
40.0000 mg | DELAYED_RELEASE_TABLET | Freq: Every day | ORAL | Status: DC
  Administered 2012-02-08: 40 mg via ORAL
  Filled 2012-02-06 (×3): qty 1

## 2012-02-06 MED ORDER — VECURONIUM BROMIDE 10 MG IV SOLR
INTRAVENOUS | Status: DC | PRN
  Administered 2012-02-06 (×2): 5 mg via INTRAVENOUS

## 2012-02-06 MED ORDER — LACTATED RINGERS IV SOLN
INTRAVENOUS | Status: DC | PRN
  Administered 2012-02-06 (×2): via INTRAVENOUS

## 2012-02-06 MED ORDER — HEPARIN SODIUM (PORCINE) 1000 UNIT/ML IJ SOLN
INTRAMUSCULAR | Status: DC | PRN
  Administered 2012-02-06: 45000 [IU] via INTRAVENOUS

## 2012-02-06 MED ORDER — POTASSIUM CHLORIDE 10 MEQ/50ML IV SOLN: 10.0000 meq | INTRAVENOUS | Status: AC

## 2012-02-06 MED ORDER — MORPHINE SULFATE 2 MG/ML IJ SOLN
2.0000 mg | INTRAMUSCULAR | Status: DC | PRN
  Administered 2012-02-07 (×7): 4 mg via INTRAVENOUS
  Filled 2012-02-06 (×7): qty 2

## 2012-02-06 MED ORDER — 0.9 % SODIUM CHLORIDE (POUR BTL) OPTIME
TOPICAL | Status: DC | PRN
  Administered 2012-02-06: 1000 mL

## 2012-02-06 MED ORDER — LIDOCAINE HCL (CARDIAC) 20 MG/ML IV SOLN
INTRAVENOUS | Status: DC | PRN
  Administered 2012-02-06: 60 mg via INTRAVENOUS

## 2012-02-06 MED ORDER — VANCOMYCIN HCL IN DEXTROSE 1-5 GM/200ML-% IV SOLN
1000.0000 mg | Freq: Once | INTRAVENOUS | Status: AC
  Administered 2012-02-06: 1000 mg via INTRAVENOUS
  Filled 2012-02-06: qty 200

## 2012-02-06 MED ORDER — METOPROLOL TARTRATE 12.5 MG HALF TABLET: 12.5000 mg | ORAL_TABLET | Freq: Once | ORAL | Status: DC

## 2012-02-06 MED ORDER — AMINOCAPROIC ACID 250 MG/ML IV SOLN
INTRAVENOUS | Status: DC | PRN
  Administered 2012-02-06: 5 g via INTRAVENOUS

## 2012-02-06 MED ORDER — THROMBIN 20000 UNITS EX KIT
PACK | CUTANEOUS | Status: DC | PRN
  Administered 2012-02-06: 20000 [IU] via TOPICAL

## 2012-02-06 MED ORDER — ARTIFICIAL TEARS OP OINT
TOPICAL_OINTMENT | OPHTHALMIC | Status: DC | PRN
  Administered 2012-02-06: 1 via OPHTHALMIC

## 2012-02-06 MED ORDER — DOCUSATE SODIUM 100 MG PO CAPS
200.0000 mg | ORAL_CAPSULE | Freq: Every day | ORAL | Status: DC
  Administered 2012-02-07 – 2012-02-11 (×5): 200 mg via ORAL
  Filled 2012-02-06 (×7): qty 2

## 2012-02-06 MED ORDER — THROMBIN 20000 UNITS EX SOLR
CUTANEOUS | Status: AC
  Filled 2012-02-06: qty 20000

## 2012-02-06 MED ORDER — POTASSIUM CHLORIDE 10 MEQ/50ML IV SOLN
INTRAVENOUS | Status: AC
  Filled 2012-02-06: qty 50

## 2012-02-06 MED ORDER — GELATIN ABSORBABLE MT POWD
OROMUCOSAL | Status: DC | PRN
  Administered 2012-02-06: 08:00:00 via TOPICAL

## 2012-02-06 MED ORDER — ACETAMINOPHEN 500 MG PO TABS
1000.0000 mg | ORAL_TABLET | Freq: Four times a day (QID) | ORAL | Status: AC
  Administered 2012-02-07 – 2012-02-11 (×13): 1000 mg via ORAL
  Filled 2012-02-06 (×22): qty 2

## 2012-02-06 MED ORDER — ALBUMIN HUMAN 5 % IV SOLN
INTRAVENOUS | Status: DC | PRN
  Administered 2012-02-06 (×2): via INTRAVENOUS

## 2012-02-06 MED ORDER — PROTAMINE SULFATE 10 MG/ML IV SOLN
INTRAVENOUS | Status: DC | PRN
  Administered 2012-02-06: 32 mg via INTRAVENOUS

## 2012-02-06 MED ORDER — HEMOSTATIC AGENTS (NO CHARGE) OPTIME
TOPICAL | Status: DC | PRN
  Administered 2012-02-06: 1 via TOPICAL

## 2012-02-06 MED ORDER — DEXMEDETOMIDINE HCL IN NACL 200 MCG/50ML IV SOLN: 0.1000 ug/kg/h | INTRAVENOUS | Status: DC

## 2012-02-06 MED ORDER — MORPHINE SULFATE 2 MG/ML IJ SOLN
1.0000 mg | INTRAMUSCULAR | Status: AC | PRN
  Administered 2012-02-06: 2 mg via INTRAVENOUS
  Filled 2012-02-06: qty 1

## 2012-02-06 MED ORDER — SODIUM CHLORIDE 0.9 % IV SOLN
250.0000 mL | INTRAVENOUS | Status: DC
  Administered 2012-02-07: 06:00:00 via INTRAVENOUS

## 2012-02-06 MED ORDER — BISACODYL 5 MG PO TBEC
10.0000 mg | DELAYED_RELEASE_TABLET | Freq: Every day | ORAL | Status: DC
  Administered 2012-02-07 – 2012-02-10 (×4): 10 mg via ORAL
  Filled 2012-02-06 (×5): qty 2

## 2012-02-06 MED ORDER — MUPIROCIN 2 % EX OINT
1.0000 "application " | TOPICAL_OINTMENT | Freq: Two times a day (BID) | CUTANEOUS | Status: AC
  Administered 2012-02-06 – 2012-02-11 (×9): 1 via NASAL
  Filled 2012-02-06: qty 22

## 2012-02-06 MED ORDER — BISACODYL 10 MG RE SUPP: 10.0000 mg | Freq: Every day | RECTAL | Status: DC

## 2012-02-06 MED ORDER — SODIUM CHLORIDE 0.45 % IV SOLN
INTRAVENOUS | Status: DC
  Administered 2012-02-06: 20 mL/h via INTRAVENOUS

## 2012-02-06 MED ORDER — ATORVASTATIN CALCIUM 10 MG PO TABS
10.0000 mg | ORAL_TABLET | Freq: Every day | ORAL | Status: DC
  Administered 2012-02-07 – 2012-02-12 (×6): 10 mg via ORAL
  Filled 2012-02-06 (×8): qty 1

## 2012-02-06 MED ORDER — LACTATED RINGERS IV SOLN: 500.0000 mL | Freq: Once | INTRAVENOUS | Status: AC | PRN

## 2012-02-06 MED ORDER — FENTANYL CITRATE 0.05 MG/ML IJ SOLN
INTRAMUSCULAR | Status: DC | PRN
  Administered 2012-02-06: 50 ug via INTRAVENOUS
  Administered 2012-02-06: 250 ug via INTRAVENOUS
  Administered 2012-02-06: 150 ug via INTRAVENOUS
  Administered 2012-02-06: 250 ug via INTRAVENOUS
  Administered 2012-02-06 (×3): 150 ug via INTRAVENOUS
  Administered 2012-02-06: 100 ug via INTRAVENOUS
  Administered 2012-02-06: 250 ug via INTRAVENOUS
  Administered 2012-02-06: 150 ug via INTRAVENOUS
  Administered 2012-02-06 (×2): 50 ug via INTRAVENOUS

## 2012-02-06 MED ORDER — METOPROLOL TARTRATE 12.5 MG HALF TABLET
12.5000 mg | ORAL_TABLET | Freq: Two times a day (BID) | ORAL | Status: DC
  Administered 2012-02-07 (×2): 12.5 mg via ORAL
  Filled 2012-02-06 (×5): qty 1

## 2012-02-06 MED ORDER — DEXTROSE 5 % IV SOLN
INTRAVENOUS | Status: DC | PRN
  Administered 2012-02-06 (×2): via INTRAVENOUS

## 2012-02-06 MED ORDER — ACETAMINOPHEN 160 MG/5ML PO SOLN
975.0000 mg | Freq: Four times a day (QID) | ORAL | Status: DC
  Administered 2012-02-07: 975 mg
  Filled 2012-02-06: qty 40.6

## 2012-02-06 MED ORDER — PROPOFOL 10 MG/ML IV BOLUS
INTRAVENOUS | Status: DC | PRN
  Administered 2012-02-06: 80 mg via INTRAVENOUS

## 2012-02-06 MED ORDER — ASPIRIN 81 MG PO CHEW: 324.0000 mg | CHEWABLE_TABLET | Freq: Every day | ORAL | Status: DC

## 2012-02-06 MED FILL — Magnesium Sulfate Inj 50%: INTRAMUSCULAR | Qty: 2 | Status: AC

## 2012-02-06 MED FILL — Potassium Chloride Inj 2 mEq/ML: INTRAVENOUS | Qty: 40 | Status: AC

## 2012-02-06 SURGICAL SUPPLY — 125 items
ADAPTER CARDIO PERF ANTE/RETRO (ADAPTER) ×2 IMPLANT
ATTRACTOMAT 16X20 MAGNETIC DRP (DRAPES) ×2 IMPLANT
BAG DECANTER FOR FLEXI CONT (MISCELLANEOUS) ×2 IMPLANT
BANDAGE ELASTIC 4 VELCRO ST LF (GAUZE/BANDAGES/DRESSINGS) ×2 IMPLANT
BANDAGE ELASTIC 6 VELCRO ST LF (GAUZE/BANDAGES/DRESSINGS) ×2 IMPLANT
BANDAGE GAUZE ELAST BULKY 4 IN (GAUZE/BANDAGES/DRESSINGS) ×2 IMPLANT
BASKET HEART (ORDER IN 25'S) (MISCELLANEOUS) ×1
BASKET HEART (ORDER IN 25S) (MISCELLANEOUS) ×1 IMPLANT
BENZOIN TINCTURE PRP APPL 2/3 (GAUZE/BANDAGES/DRESSINGS) ×2 IMPLANT
BLADE STERNUM SYSTEM 6 (BLADE) ×2 IMPLANT
BLADE SURG 11 STRL SS (BLADE) ×2 IMPLANT
BLADE SURG 15 STRL LF DISP TIS (BLADE) ×1 IMPLANT
BLADE SURG 15 STRL SS (BLADE) ×1
CANISTER SUCTION 2500CC (MISCELLANEOUS) ×2 IMPLANT
CANNULA ARTERIAL NVNT 3/8 22FR (MISCELLANEOUS) ×2 IMPLANT
CANNULA GUNDRY RCSP 15FR (MISCELLANEOUS) ×2 IMPLANT
CATH HEART VENT LEFT (CATHETERS) ×1 IMPLANT
CATH ROBINSON RED A/P 18FR (CATHETERS) ×4 IMPLANT
CATH THORACIC 28FR (CATHETERS) ×2 IMPLANT
CATH THORACIC 28FR RT ANG (CATHETERS) IMPLANT
CATH THORACIC 36FR (CATHETERS) ×2 IMPLANT
CATH THORACIC 36FR RT ANG (CATHETERS) ×2 IMPLANT
CLIP TI MEDIUM 24 (CLIP) IMPLANT
CLIP TI WIDE RED SMALL 24 (CLIP) IMPLANT
CLOTH BEACON ORANGE TIMEOUT ST (SAFETY) ×2 IMPLANT
CLSR STERI-STRIP ANTIMIC 1/2X4 (GAUZE/BANDAGES/DRESSINGS) ×2 IMPLANT
CONN Y 3/8X3/8X3/8  BEN (MISCELLANEOUS) ×1
CONN Y 3/8X3/8X3/8 BEN (MISCELLANEOUS) ×1 IMPLANT
CONT SPEC 4OZ CLIKSEAL STRL BL (MISCELLANEOUS) ×2 IMPLANT
CONT SPECI 4OZ STER CLIK (MISCELLANEOUS) ×2 IMPLANT
COVER SURGICAL LIGHT HANDLE (MISCELLANEOUS) ×2 IMPLANT
CRADLE DONUT ADULT HEAD (MISCELLANEOUS) ×2 IMPLANT
DRAPE CARDIOVASCULAR INCISE (DRAPES) ×1
DRAPE SLUSH MACHINE 52X66 (DRAPES) IMPLANT
DRAPE SLUSH/WARMER DISC (DRAPES) IMPLANT
DRAPE SRG 135X102X78XABS (DRAPES) ×1 IMPLANT
DRSG COVADERM 4X14 (GAUZE/BANDAGES/DRESSINGS) ×2 IMPLANT
ELECT CAUTERY BLADE 6.4 (BLADE) ×2 IMPLANT
ELECT REM PT RETURN 9FT ADLT (ELECTROSURGICAL) ×4
ELECTRODE REM PT RTRN 9FT ADLT (ELECTROSURGICAL) ×2 IMPLANT
GLOVE BIO SURGEON STRL SZ 6 (GLOVE) ×4 IMPLANT
GLOVE BIO SURGEON STRL SZ 6.5 (GLOVE) IMPLANT
GLOVE BIO SURGEON STRL SZ7 (GLOVE) IMPLANT
GLOVE BIO SURGEON STRL SZ7.5 (GLOVE) IMPLANT
GLOVE BIO SURGEON STRL SZ8.5 (GLOVE) ×2 IMPLANT
GLOVE BIOGEL PI IND STRL 6 (GLOVE) ×5 IMPLANT
GLOVE BIOGEL PI IND STRL 6.5 (GLOVE) ×2 IMPLANT
GLOVE BIOGEL PI IND STRL 7.0 (GLOVE) ×4 IMPLANT
GLOVE BIOGEL PI INDICATOR 6 (GLOVE) ×5
GLOVE BIOGEL PI INDICATOR 6.5 (GLOVE) ×2
GLOVE BIOGEL PI INDICATOR 7.0 (GLOVE) ×4
GLOVE EUDERMIC 7 POWDERFREE (GLOVE) ×4 IMPLANT
GLOVE ORTHO TXT STRL SZ7.5 (GLOVE) IMPLANT
GOWN PREVENTION PLUS XLARGE (GOWN DISPOSABLE) ×2 IMPLANT
GOWN STRL NON-REIN LRG LVL3 (GOWN DISPOSABLE) ×12 IMPLANT
HANDLE UNIV ENDO GIA (ENDOMECHANICALS) ×2 IMPLANT
HEMOSTAT POWDER SURGIFOAM 1G (HEMOSTASIS) ×6 IMPLANT
HEMOSTAT SURGICEL 2X14 (HEMOSTASIS) ×2 IMPLANT
INSERT FOGARTY 61MM (MISCELLANEOUS) IMPLANT
INSERT FOGARTY XLG (MISCELLANEOUS) IMPLANT
KIT BASIN OR (CUSTOM PROCEDURE TRAY) ×2 IMPLANT
KIT CATH CPB BARTLE (MISCELLANEOUS) ×2 IMPLANT
KIT ROOM TURNOVER OR (KITS) ×2 IMPLANT
KIT SUCTION CATH 14FR (SUCTIONS) ×2 IMPLANT
KIT VASOVIEW ACCESSORY VH 2004 (KITS) ×2 IMPLANT
KIT VASOVIEW W/TROCAR VH 2000 (KITS) ×2 IMPLANT
LINE VENT (MISCELLANEOUS) ×2 IMPLANT
NS IRRIG 1000ML POUR BTL (IV SOLUTION) ×12 IMPLANT
PACK OPEN HEART (CUSTOM PROCEDURE TRAY) ×2 IMPLANT
PAD ARMBOARD 7.5X6 YLW CONV (MISCELLANEOUS) ×4 IMPLANT
PENCIL BUTTON HOLSTER BLD 10FT (ELECTRODE) ×2 IMPLANT
PUNCH AORTIC ROTATE 4.0MM (MISCELLANEOUS) IMPLANT
PUNCH AORTIC ROTATE 4.5MM 8IN (MISCELLANEOUS) ×2 IMPLANT
PUNCH AORTIC ROTATE 5MM 8IN (MISCELLANEOUS) IMPLANT
RELOAD EGIA 60 MED/THCK PURPLE (STAPLE) ×8 IMPLANT
SET CARDIOPLEGIA MPS 5001102 (MISCELLANEOUS) ×2 IMPLANT
SLEEVE SURGEON STRL (DRAPES) ×2 IMPLANT
SPONGE GAUZE 4X4 12PLY (GAUZE/BANDAGES/DRESSINGS) ×2 IMPLANT
SPONGE INTESTINAL PEANUT (DISPOSABLE) IMPLANT
SPONGE LAP 18X18 X RAY DECT (DISPOSABLE) IMPLANT
SPONGE LAP 4X18 X RAY DECT (DISPOSABLE) ×2 IMPLANT
SUT BONE WAX W31G (SUTURE) ×2 IMPLANT
SUT ETHIBON 2 0 V 52N 30 (SUTURE) ×4 IMPLANT
SUT ETHIBOND 2 0 SH (SUTURE) ×1
SUT ETHIBOND 2 0 SH 36X2 (SUTURE) ×1 IMPLANT
SUT ETHILON 3 0 FSL (SUTURE) ×2 IMPLANT
SUT MNCRL AB 4-0 PS2 18 (SUTURE) ×2 IMPLANT
SUT PROLENE 3 0 SH DA (SUTURE) IMPLANT
SUT PROLENE 3 0 SH1 36 (SUTURE) ×2 IMPLANT
SUT PROLENE 4 0 RB 1 (SUTURE) ×3
SUT PROLENE 4 0 SH DA (SUTURE) IMPLANT
SUT PROLENE 4-0 RB1 .5 CRCL 36 (SUTURE) ×3 IMPLANT
SUT PROLENE 5 0 C 1 36 (SUTURE) IMPLANT
SUT PROLENE 6 0 C 1 30 (SUTURE) IMPLANT
SUT PROLENE 7 0 BV 1 (SUTURE) IMPLANT
SUT PROLENE 7 0 BV1 MDA (SUTURE) ×2 IMPLANT
SUT PROLENE 8 0 BV175 6 (SUTURE) IMPLANT
SUT SILK  1 MH (SUTURE)
SUT SILK 1 MH (SUTURE) IMPLANT
SUT SILK 2 0 SH CR/8 (SUTURE) ×2 IMPLANT
SUT STEEL STERNAL CCS#1 18IN (SUTURE) IMPLANT
SUT STEEL SZ 6 DBL 3X14 BALL (SUTURE) ×6 IMPLANT
SUT VIC AB 1 CTX 36 (SUTURE) ×2
SUT VIC AB 1 CTX36XBRD ANBCTR (SUTURE) ×2 IMPLANT
SUT VIC AB 2-0 CT1 27 (SUTURE) ×1
SUT VIC AB 2-0 CT1 TAPERPNT 27 (SUTURE) ×1 IMPLANT
SUT VIC AB 2-0 CTX 27 (SUTURE) IMPLANT
SUT VIC AB 3-0 SH 27 (SUTURE)
SUT VIC AB 3-0 SH 27X BRD (SUTURE) IMPLANT
SUT VIC AB 3-0 X1 27 (SUTURE) IMPLANT
SUT VICRYL 4-0 PS2 18IN ABS (SUTURE) IMPLANT
SUTURE E-PAK OPEN HEART (SUTURE) ×2 IMPLANT
SYSTEM SAHARA CHEST DRAIN ATS (WOUND CARE) ×2 IMPLANT
TAPE CLOTH SURG 4X10 WHT LF (GAUZE/BANDAGES/DRESSINGS) ×2 IMPLANT
TAPE PAPER 2X10 WHT MICROPORE (GAUZE/BANDAGES/DRESSINGS) ×2 IMPLANT
TOWEL OR 17X24 6PK STRL BLUE (TOWEL DISPOSABLE) ×2 IMPLANT
TOWEL OR 17X26 10 PK STRL BLUE (TOWEL DISPOSABLE) ×2 IMPLANT
TRAY FOLEY IC TEMP SENS 14FR (CATHETERS) ×2 IMPLANT
TUBE SUCT INTRACARD DLP 20F (MISCELLANEOUS) ×2 IMPLANT
TUBING INSUFFLATION 10FT LAP (TUBING) ×2 IMPLANT
UNDERPAD 30X30 INCONTINENT (UNDERPADS AND DIAPERS) ×2 IMPLANT
VALVE REGENT 23MM (Valve) ×2 IMPLANT
VENT LEFT HEART 12002 (CATHETERS) ×2
WATER STERILE IRR 1000ML POUR (IV SOLUTION) ×4 IMPLANT
YANKAUER SUCT BULB TIP NO VENT (SUCTIONS) ×2 IMPLANT

## 2012-02-06 NOTE — Anesthesia Procedure Notes (Signed)
Procedure Name: Intubation Date/Time: 02/06/2012 8:20 AM Performed by: Sherie Don Pre-anesthesia Checklist: Patient identified, Emergency Drugs available, Suction available, Patient being monitored and Timeout performed Patient Re-evaluated:Patient Re-evaluated prior to inductionOxygen Delivery Method: Circle system utilized Preoxygenation: Pre-oxygenation with 100% oxygen Intubation Type: IV induction Ventilation: Mask ventilation without difficulty Laryngoscope Size: Mac and 4 Grade View: Grade II Tube type: Oral Tube size: 8.0 mm Number of attempts: 1 Airway Equipment and Method: Stylet and Oral airway Placement Confirmation: ETT inserted through vocal cords under direct vision,  positive ETCO2 and breath sounds checked- equal and bilateral Secured at: 22 cm Tube secured with: Tape Dental Injury: Teeth and Oropharynx as per pre-operative assessment

## 2012-02-06 NOTE — Transfer of Care (Signed)
Immediate Anesthesia Transfer of Care Note  Patient: Adam Ibarra  Procedure(s) Performed: Procedure(s) (LRB) with comments: CORONARY ARTERY BYPASS GRAFTING (CABG) (N/A) - CABG x three,  using left internal mammary artery and right leg greater saphenous vein harvested endoscopically AORTIC VALVE REPLACEMENT (AVR) (N/A)  Patient Location: SICU  Anesthesia Type:General  Level of Consciousness: sedated and Patient remains intubated per anesthesia plan  Airway & Oxygen Therapy: Patient remains intubated per anesthesia plan and Patient placed on Ventilator (see vital sign flow sheet for setting)  Post-op Assessment: Report given to SICU RN and Post -op Vital signs reviewed and stable  Post vital signs: Reviewed and stable  Complications: No apparent anesthesia complications

## 2012-02-06 NOTE — Anesthesia Postprocedure Evaluation (Signed)
  Anesthesia Post-op Note  Patient: Adam Ibarra  Procedure(s) Performed: Procedure(s) (LRB) with comments: CORONARY ARTERY BYPASS GRAFTING (CABG) (N/A) - CABG x three,  using left internal mammary artery and right leg greater saphenous vein harvested endoscopically AORTIC VALVE REPLACEMENT (AVR) (N/A)  Patient Location: SICU  Anesthesia Type:General  Level of Consciousness: unresponsive and Patient remains intubated per anesthesia plan  Airway and Oxygen Therapy: Patient remains intubated per anesthesia plan and Patient placed on Ventilator (see vital sign flow sheet for setting)  Post-op Pain: none  Post-op Assessment: Post-op Vital signs reviewed and Patient's Cardiovascular Status Stable  Post-op Vital Signs: stable  Complications: No apparent anesthesia complications

## 2012-02-06 NOTE — Anesthesia Preprocedure Evaluation (Signed)
Anesthesia Evaluation  Patient identified by MRN, date of birth, ID band Patient awake    Reviewed: Allergy & Precautions, H&P , NPO status , Patient's Chart, lab work & pertinent test results, reviewed documented beta blocker date and time   Airway       Dental   Pulmonary sleep apnea, Continuous Positive Airway Pressure Ventilation and Oxygen sleep apnea , former smoker,          Cardiovascular hypertension, Pt. on home beta blockers + angina with exertion     Neuro/Psych    GI/Hepatic GERD-  Controlled,  Endo/Other  diabetes, Type 1  Renal/GU Renal disease     Musculoskeletal   Abdominal   Peds  Hematology   Anesthesia Other Findings   Reproductive/Obstetrics                           Anesthesia Physical Anesthesia Plan Anesthesia Quick Evaluation

## 2012-02-06 NOTE — CV Procedure (Addendum)
Intraoperative Transesophageal Echocardiography Report:  [name] is a 61 year old male with a history of diabetes, hypertension, obesity and obstructive sleep apnea who presented with a one-year history of exertional chest pain and shortness of breath. He was found to have severe three-vessel coronary artery disease and  preserved left ventricular function with moderate aortic stenosis. He  is now scheduled to undergo coronary artery bypass grafting and possible aortic valve replacement by Dr. Laneta Simmers. Intraoperative TEE was requested to evaluate the aortic valve, assess for any other valvular pathology, and to serve as a monitor right and left ventricular function.  The patient was brought to the operating room at [facility] and general anesthesia was induced without difficulty. Following endotracheal intubation and orogastric suctioning, the transesophageal echocardiography probe was inserted into the esophagus without difficulty.  Impression: Pre-bypass findings:  1. Aortic valve: The aortic valve was trileaflet and there was obvious thickening and reduced mobility of the leaflets with restriction of opening. No aortic insufficiency was seen with color doppler. Continuous-wave Doppler interrogation of the left ventricular outflow tract revealed a peak velocity of 3.6 m/s with a peak instantaneous gradient of 47 mm of mercury and a mean gradient of 27 mm of mercury. The aortic valve area by the continuity equation was 1.19 cm using the VTI method and 1.15 cm using the V-max method. The aortic annulus measured 2.35 cm in diameter.  2. Mitral valve: The mitral leaflets appeared to coapt normally without evidence of any prolapsing or flail segments. There was trace mitral insufficiency. There was moderate mitral annular calcification which was more prominent at the base the posterior leaflet  3. Left Ventricle: There was moderate left ventricular hypertrophy. Left ventricular wall  thickness measured 1.2 cm at  the posterior wall and 1.3 cm at the anterior wall at end diastole at the mid-papillary level in the transgastric short axis view. Left ventricular end-diastolic diameter measured 3.0 cm and left ventricular end-systolic diameter measured 1.94 cm at the chordal levcel  in the transgastric long axis view. Ejection fraction was estimated 65%. There was normal systolic function of the left ventricle and no thrombus noted in the left ventricular apex. There were no regional wall motion abnormalities.  4. Right ventricle: The right ventricular cavity was of normal size and there was normal contractility the right ventricular free wall.  5. Interatrial septum: The interatrial septum was intact without evidence of patent foramen ovale or atrial septal defect by color Doppler or bubble study.  6. Left atrium: There is no thrombus noted in the left atrium or left atrial appendage.  7. Ascending aorta: The ascending aorta was not dilated and there was no effacement of the aortic root or sinotubular ridge. There was moderate calcification noted in the region of the right sinus of Valsalva. There was mild thickening of the wall of the ascending aorta but no severe atheromatous disease noted.  8. Descending aorta: The descending aorta showed mild atheromatous disease and measured 2.28 cm in diameter.  Post-bypass findings:  1. 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 Both leaflets opened and closed normally. There were the normal appearing washing jets of aortic insufficiency appreciated but no perivalvular leak appreciated.   2. Mitral valve: The mitral leaflets coapted well and there was trace mitral insufficiency which was unchanged from the pre-bypass study.  3. Left ventricle: There was moderate concentric left ventricular hypertrophy and normal systolic function which was unchanged from the pre-bypass exam.  4. Right  ventricle:  There was normal appearing right ventricular size and there was normal contractility of the right ventricular free wall. This was unchanged from the pre-bypass exam.  Adam Ibarra, M.D.  Addendum:  Error Correction: The following highlighted numbers reflect  corrected data from the continuous wave doppler interrogation of the LV outflow. The original numbers were reported incorrectly. The original  aortic valve area and annulus diameter measurements are correct.  Impression: Pre-bypass findings:   1. Aortic valve: The aortic valve was trileaflet and there was obvious thickening and reduced mobility of the leaflets with restriction of opening. No aortic insufficiency was seen with color doppler. Continuous-wave Doppler interrogation of the left ventricular outflow tract revealed a peak velocity of 2.65 m/s with a peak instantaneous gradient of 28 mm of mercury and a mean gradient of 12 mm of mercury. The aortic valve area by the continuity equation was 1.19 cm using the VTI method and 1.15 cm using the V-max method. The aortic annulus measured 2.35 cm in diameter.   Adam Brood, MD

## 2012-02-06 NOTE — Progress Notes (Signed)
  Echocardiogram Echocardiogram Transesophageal has been performed.  Adam Ibarra 02/06/2012, 9:03 AM

## 2012-02-06 NOTE — Brief Op Note (Addendum)
02/06/2012  12:00 PM  PATIENT:  Adam Ibarra  61 y.o. male  PRE-OPERATIVE DIAGNOSIS:  CAD, AS  POST-OPERATIVE DIAGNOSIS:  CAD, AS  PROCEDURE:   CORONARY ARTERY BYPASS GRAFTING x 3 (LIMA-LAD, SVG-OM1, SVG-PD), EVH right leg  AORTIC VALVE REPLACEMENT (23 mm St[employer] Jude [employer] mechanical valve)  Wedge biopsy of right upper lobe of lung   Moderate aortic stenosis with calcified leaflets and restricted mobility. AVA 1.0 cm 2 by continuity, 1.1 cm 2 by planimetry. Peak gradient 35, mean gradient 15.  SURGEON:  Surgeon(s): Alleen Borne, MD  ASSISTANT: Coral Ceo, PA-C  ANESTHESIA:   general  PATIENT CONDITION:  ICU - intubated and hemodynamically stable.  PRE-OPERATIVE WEIGHT: 126 kg

## 2012-02-06 NOTE — Interval H&P Note (Signed)
History and Physical Interval Note:  02/06/2012 7:33 AM  Adam Ibarra  has presented today for surgery, with the diagnosis of CAD  The various methods of treatment have been discussed with the patient and family. After consideration of risks, benefits and other options for treatment, the patient has consented to  Procedure(s) (LRB) with comments: CORONARY ARTERY BYPASS GRAFTING (CABG) (N/A) as a surgical intervention .  The patient's history has been reviewed, patient examined, no change in status, stable for surgery.  I have reviewed the patient's chart and labs.  Questions were answered to the patient's satisfaction.     Alleen Borne

## 2012-02-06 NOTE — Preoperative (Signed)
Beta Blockers   Reason not to administer Beta Blockers:Coreg taken at 0400 02/06/12

## 2012-02-06 NOTE — OR Nursing (Signed)
13:45-2nd call to SICU.

## 2012-02-06 NOTE — Care Management Note (Addendum)
    Page 1 of 2   02/13/2012     2:20:23 PM   CARE MANAGEMENT NOTE 02/13/2012  Patient:  Adam Ibarra,Adam Ibarra   Account Number:  [id number]  Date Initiated:  02/06/2012  Documentation initiated by:  Junius Creamer  Subjective/Objective Assessment:   adm w cabg     Action/Plan:   lives w wife, pcp dr Beverely Risen   Anticipated DC Date:  02/13/2012   Anticipated DC Plan:  HOME W HOME HEALTH SERVICES      DC Planning Services  CM consult      [facility] Choice  HOME HEALTH   Choice offered to / List presented to:  C-1 Patient        HH arranged  HH-1 RN      [facility] agency  [facility]   Status of service:  Completed, signed off Medicare Important Message given?   (If response is "NO", the following Medicare IM given date fields will be blank) Date Medicare IM given:   Date Additional Medicare IM given:    Discharge Disposition:  HOME W HOME HEALTH SERVICES  Per UR Regulation:  Reviewed for med. necessity/level of care/duration of stay  If discussed at Long Length of Stay Meetings, dates discussed:    Comments:  02/13/12 Adam Ibarra [phone] PT FOR DC HOME TODAY.  WILL NEED HHRN FOR RESTORATIVE CARE AND PT/INR BLOOD DRAWS.  MET WITH PT; REFERRAL TO [facility] , PER PT CHOICE.  START OF CARE 24-48H POST DC DATE.  12/20 1542 Adam dowell rn,bsn pt just back from surg. will moniter for dc needs as pt progresses.

## 2012-02-06 NOTE — Progress Notes (Signed)
Day of Surgery Procedure(s) (LRB): CORONARY ARTERY BYPASS GRAFTING (CABG) (N/A) AORTIC VALVE REPLACEMENT (AVR) (N/A) Subjective: S/p CABG, AVR St Jude  Objective: Vital signs in last 24 hours: Temp:  [96.4 F (35.8 C)-98.3 F (36.8 C)] 96.4 F (35.8 C) (12/20 1600) Pulse Rate:  [79-89] 89  (12/20 1600) Cardiac Rhythm:  [-] A-V Sequential paced (12/20 1500) Resp:  [12-18] 14  (12/20 1600) BP: (99-113)/(45-69) 108/51 mmHg (12/20 1600) SpO2:  [95 %-97 %] 97 % (12/20 1415) Arterial Line BP: (122)/(52) 122/52 mmHg (12/20 1600) FiO2 (%):  [50 %] 50 % (12/20 1415)  Hemodynamic parameters for last 24 hours:    Intake/Output from previous day:   Intake/Output this shift:    Hemodynamics stable  Lab Results:  Basename 02/06/12 1436 02/06/12 1431 02/06/12 1202  WBC -- 13.0* --  HGB 8.8* 7.5* --  HCT 26.0* 21.8* --  PLT -- 99* 114*   BMET:  Basename 02/06/12 1436 02/06/12 1303  NA 139 138  K 4.2 4.1  CL -- --  CO2 -- --  GLUCOSE 136* 163*  BUN -- --  CREATININE -- --  CALCIUM -- --    PT/INR:  Basename 02/06/12 1431  LABPROT 17.2*  INR 1.44   ABG    Component Value Date/Time   PHART 7.297* 02/06/2012 1440   HCO3 23.3 02/06/2012 1440   TCO2 25 02/06/2012 1440   ACIDBASEDEF 3.0* 02/06/2012 1440   O2SAT 98.0 02/06/2012 1440   CBG (last 3)   Basename 02/06/12 0557  GLUCAP 192*    Assessment/Plan: S/P Procedure(s) (LRB): CORONARY ARTERY BYPASS GRAFTING (CABG) (N/A) AORTIC VALVE REPLACEMENT (AVR) (N/A) Doing well post-op   LOS: 0 days    VAN TRIGT III,Traci Gafford 02/06/2012

## 2012-02-06 NOTE — Progress Notes (Signed)
Metoprolol order not given due to pt. Taking his beta blocker at home this am.

## 2012-02-06 NOTE — Transfer of Care (Signed)
Immediate Anesthesia Transfer of Care Note  Patient: Adam Ibarra  Procedure(s) Performed: Procedure(s) (LRB) with comments: CORONARY ARTERY BYPASS GRAFTING (CABG) (N/A) - CABG x three,  using left internal mammary artery and right leg greater saphenous vein harvested endoscopically AORTIC VALVE REPLACEMENT (AVR) (N/A)  Patient Location: [place]  Anesthesia Type:General  Level of Consciousness: sedated and Patient remains intubated per anesthesia plan  Airway & Oxygen Therapy: Patient remains intubated per anesthesia plan  Post-op Assessment: Report given to SICU RN and Post -op Vital signs reviewed and stable  Post vital signs: Reviewed and stable  Complications: No apparent anesthesia complications

## 2012-02-06 NOTE — H&P (Signed)
[address]            Adam Ibarra [address]          [phone]      PCP is Ibarra, Adam Harper, MD  Referring Provider is Adam Ouch, MD  Chief Complaint   Patient presents with   .  Coronary Artery Disease     eval and treat, cathed at [facility] 01/23/12   HPI:  The patient is a 61 year old gentleman with history of type 2 diabetes, hypertension, hyperlipidemia, and obesity who presented with an approximately one-year history of exertional substernal chest pain and shortness of breath. Over the past few months he has noticed worsening of his symptoms and is now having substernal chest pain with mild activity such as walking to his mailbox. He has not had any chest discomfort with normal activities inside his house or at rest. He had a nuclear stress test last January which showed a fixed inferior wall defect without reversibility. An echocardiogram showed normal left ventricular systolic function with mild aortic stenosis with mildly calcified leaflets. The AVA was 1.91 cm2 by VTI and 1.26 cm2 by Vmax. Mean gradient was 16 and Ibarra gradient was 36. He recently underwent cardiac catheterization at [facility] which showed severe three-vessel coronary disease with an occluded mid right coronary artery supplied distally by collaterals.  Past Medical History   Diagnosis  Date   .  Sleep apnea: uses cpap with oxygen at night    .  Arthritis    .  Hyperlipidemia    .  Hypertension    .  Nephrolithiasis    .  Heart murmur    .  Diabetes mellitus without complication      Type II   .  Aortic stenosis      Mild by echo   .  Obesity     Past Surgical History   Procedure  Date   .  Vasectomy    .  Vasectomy reversal    .  Appendectomy    .  Cataract extraction, bilateral     Family History   Problem  Relation  Age of Onset   .  Heart disease  Mother    .  Heart disease  Brother    Social History  History   Substance Use Topics   .  Smoking  status:  Former Smoker -- 3.0 packs/day for 9 years     Types:  Cigarettes   .  Smokeless tobacco:  Never Used   .  Alcohol Use:  No      Comment: SOCIAL    Current Outpatient Prescriptions   Medication  Sig  Dispense  Refill   .  amitriptyline (ELAVIL) 25 MG tablet  Take 25 mg by mouth at bedtime.     Marland Kitchen  amLODipine (NORVASC) 5 MG tablet  Take 5 mg by mouth daily.     Marland Kitchen  aspirin 81 MG tablet  Take 81 mg by mouth daily.     Marland Kitchen  atorvastatin (LIPITOR) 10 MG tablet  Take 10 mg by mouth daily.     .  carvedilol (COREG) 25 MG tablet  Take 25 mg by mouth 2 (two) times daily with a meal.     .  gabapentin (NEURONTIN) 100 MG capsule  Take 100 mg by mouth at bedtime.     Marland Kitchen  glimepiride (AMARYL) 4 MG tablet  Take 4 mg by mouth 2 (two) times daily.     .  insulin glargine (LANTUS) 100 UNIT/ML injection  Inject 20-25 Units into the skin daily. Take 25 units in the morning and 20 units in the evening     .  losartan-hydrochlorothiazide (HYZAAR) 100-25 MG per tablet  Take 1 tablet by mouth daily.     .  NON FORMULARY  CPAP to be used nightly with oxygen @ 1L.     .  Saxagliptin-Metformin (KOMBIGLYZE XR) 2.06-998 MG TB24  Take by mouth 2 (two) times daily.      Allergies   Allergen  Reactions   .  Ivp Dye (Iodinated Diagnostic Agents)    Review of Systems  Constitutional: Positive for activity change and fatigue. Negative for unexpected weight change.  HENT: Negative.  Eyes:  Vision improved since cataract surgery.  Respiratory: Positive for apnea and shortness of breath.  Cardiovascular: Positive for chest pain, palpitations and leg swelling.  Gastrointestinal: Negative.  Genitourinary: Negative.  Musculoskeletal: Positive for arthralgias.  Skin: Negative.  Neurological: Negative.  Hematological: Negative.  Psychiatric/Behavioral: Negative.  BP 127/73  Pulse 105  Resp 16  Ht 6' (1.829 m)  Wt 280 lb (127.007 kg)  BMI 37.97 kg/m2  SpO2 93%  Physical Exam  Constitutional: He is oriented to  person, place, and time.  Obese white male in no distress.  HENT:  Head: Normocephalic and atraumatic.  Mouth/Throat: Oropharynx is clear and moist.  Eyes: Conjunctivae normal and EOM are normal. Pupils are equal, round, and reactive to light. No scleral icterus.  Neck: Normal range of motion. No JVD present. No tracheal deviation present. No thyromegaly present.  Cardiovascular: Normal rate, regular rhythm and intact distal pulses.  Murmur heard. 1/6 systolic murmur over aorta  Pulmonary/Chest: Effort normal and breath sounds normal. No respiratory distress. He has no wheezes. He has no rales.  Abdominal: Soft. Bowel sounds are normal. He exhibits no distension and no mass. There is no tenderness.  Musculoskeletal: Normal range of motion. He exhibits no edema.  Lymphadenopathy:  He has no cervical adenopathy.  Neurological: He is alert and oriented to person, place, and time. He has normal strength. No cranial nerve deficit or sensory deficit.  Skin: Skin is warm and dry.  Psychiatric: He has a normal mood and affect.  Diagnostic Tests:  *Adam Ibarra* [address] [place], [place] [address] [phone]  ------------------------------------------------------------ Transthoracic Echocardiography  Patient: Adam Ibarra, Adam Ibarra MR #: [id number] Study Date: 01/27/2012 Gender: M Age: 40 Height: 182.9cm Weight: 127kg BSA: 2.65m^2 Pt. Status: Room:  ATTENDING Default, Provider Adam Ibarra REFERRING Adam Ibarra PERFORMING Adam Gardens, Adam Ibarra REFERRING Ibarra cc:  ------------------------------------------------------------ LV EF: 60% - 65%  ------------------------------------------------------------ History: PMH: Patient had heart cath on 01-23-12, showed significant 3 vessel disease. Being worked up for CABG, unable to obtain good EF by cath echo requested. Acquired from the patient and from the patient's chart.  Dyspnea. Mild aortic stenosis.  ------------------------------------------------------------ Study Conclusions  - Left ventricle: The cavity size was normal. Wall thickness was increased in a pattern of moderate LVH. Systolic function was normal. The estimated ejection fraction was in the range of 60% to 65%. Wall motion was normal; there were no regional wall motion abnormalities. Left ventricular diastolic function parameters were normal. - Aortic valve: Calcified annulus. Trileaflet; normal thickness, mildly calcified leaflets. Cusp separation was mildly reduced. There was mild stenosis. Mean gradient: 16mm Hg (S).  Valve area: 1.91cm^2(VTI). - Mitral valve: Trivial regurgitation. - Left atrium: The atrium was mildly dilated. Transthoracic echocardiography. M-mode, complete 2D, spectral Doppler, and color Doppler. Height: Height: 182.9cm. Height: 72in. Weight: Weight: 127kg. Weight: 279.4lb. Body mass index: BMI: 38kg/m^2. Body surface area: BSA: 2.28m^2. Blood pressure: 165/95. Patient status: Outpatient.  ------------------------------------------------------------  ------------------------------------------------------------ Left ventricle: The cavity size was normal. Wall thickness was increased in a pattern of moderate LVH. Systolic function was normal. The estimated ejection fraction was in the range of 60% to 65%. Wall motion was normal; there were no regional wall motion abnormalities. The transmitral flow pattern was normal. The deceleration time of the early transmitral flow velocity was normal. The pulmonary vein flow pattern was normal. The tissue Doppler parameters were normal. Left ventricular diastolic function parameters were normal.  ------------------------------------------------------------ Aortic valve: Poorly visualized. Calcified annulus. Trileaflet; normal thickness, mildly calcified leaflets. Cusp separation was mildly reduced. Mobility was  not restricted. Doppler: There was mild stenosis. No regurgitation. VTI ratio of LVOT to aortic valve: 0.5. Valve area: 1.91cm^2(VTI). Indexed valve area: 0.78cm^2/m^2 (VTI). Ibarra velocity ratio of LVOT to aortic valve: 0.31. Valve area: 1.26cm^2 (Vmax). Indexed valve area: 0.51cm^2/m^2 (Vmax). Mean gradient: 16mm Hg (S). Ibarra gradient: 36mm Hg (S).  ------------------------------------------------------------ Aorta: Aortic root: The aortic root was normal in size. Ascending aorta: The ascending aorta was normal in size.  ------------------------------------------------------------ Mitral valve: Structurally normal valve. Mobility was not restricted. Doppler: Transvalvular velocity was within the normal range. There was no evidence for stenosis. Trivial regurgitation. Ibarra gradient: 3mm Hg (D).  ------------------------------------------------------------ Left atrium: The atrium was mildly dilated.  ------------------------------------------------------------ Right ventricle: The cavity size was normal. Wall thickness was normal. Systolic function was normal.  ------------------------------------------------------------ Pulmonic valve: Poorly visualized. Doppler: Transvalvular velocity was within the normal range. There was no evidence for stenosis.  ------------------------------------------------------------ Tricuspid valve: Poorly visualized. Structurally normal valve. Doppler: Transvalvular velocity was within the normal range. No regurgitation.  ------------------------------------------------------------ Pulmonary artery: The main pulmonary artery was normal-sized. Systolic pressure could not be accurately estimated.  ------------------------------------------------------------ Right atrium: The atrium was normal in size.  ------------------------------------------------------------ Pericardium: There was no pericardial  effusion.  ------------------------------------------------------------ Systemic veins: Inferior vena cava: The vessel was normal in size.  ------------------------------------------------------------  2D measurements Normal Doppler measurements Normal Left ventricle LVOT LVID ED, 36.5 mm 43-52 Ibarra vel, 91 cm/s ------ chord, S PLAX VTI, S 26 cm ------ LVID ES, 21.2 mm 23-38 Aortic valve chord, Ibarra vel, 291 cm/s ------ PLAX S FS, chord, 42 % >29 Mean vel, 167 cm/s ------ PLAX S LVPW, ED 14.7 mm ------ VTI, S 52.5 cm ------ IVS/LVPW 1.05 <1.3 Mean 16 mm Hg ------ ratio, ED gradient, Vol ED, 102 ml ------ S MOD1 Ibarra 36 mm Hg ------ Vol ES, 28 ml ------ gradient, MOD1 S EF, MOD1 73 % ------ VTI ratio 0.5 ------ Vol index, 41 ml/m^2 ------ LVOT/AV ED, MOD1 Area, VTI 1.91 cm^2 ------ Vol index, 11 ml/m^2 ------ Area index 0.78 cm^2/m ------ ES, MOD1 (VTI) ^2 Vol ED, 97 ml ------ Ibarra vel 0.31 ------ MOD2 ratio, Vol ES, 25 ml ------ LVOT/AV MOD2 Area, Vmax 1.26 cm^2 ------ EF, MOD2 74 % ------ Area index 0.51 cm^2/m ------ Stroke 72 ml ------ (Vmax) ^2 vol, MOD2 Mitral valve Vol index, 39 ml/m^2 ------ Ibarra E vel 81.4 cm/s ------ ED, MOD2 Ibarra A vel 68.1 cm/s ------ Vol index, 10 ml/m^2 ------ Decelerati 264 ms 150-23 ES, MOD2 on time 0 Stroke 29.3 ml/m^2 ------ Ibarra 3 mm Hg ------ index, gradient,  MOD2 D Ventricular septum Ibarra E/A 1.2 ------ IVS, ED 15.4 mm ------ ratio LVOT Pulmonic valve Diam, S 23 mm ------ Ibarra vel, 141 cm/s ------ Area 4.15 cm^2 ------ S Aorta Root diam, 33 mm ------ ED Left atrium AP dim 43 mm ------ AP dim 1.75 cm/m^2 <2.2 index Vol, S 24.1 ml ------ Vol index, 9.8 ml/m^2 ------ S Right ventricle RVID ED, 32 mm 19-38 PLAX  ------------------------------------------------------------ Prepared and Electronically Authenticated by  Lorine Bears 2013-12-10T13:11:25.203   Impression/Plan:   He has severe three-vessel coronary  disease with worsening exertional angina. He has mild aortic stenosis by echocardiogram. I agree that coronary bypass graft surgery is the best treatment to prevent further ischemia and infarction and improve his quality of life. His aortic stenosis is mild and I think that if we replaced his aortic valve at this time he may end up with the same or a higher gradient. I think the risk is higher than the benefit at this time. I discussed the possibility that he may have progressive aortic stenosis and require valve replacement in the future if this occurs we could consider TAVR. His chest x-ray reportedly shows some honeycombing of the lungs consistent with some interstitial lung disease. He is very functional at this point with minimal pulmonary symptoms and I don't think this is a contraindication to surgery. He has an appointment for pulmonary function testing and pulmonary medicine referral in January 2014. I discussed the operative procedure with the patient and family including alternatives, benefits and risks; including but not limited to bleeding, blood transfusion, infection, stroke, myocardial infarction, graft failure, heart block requiring a permanent pacemaker, organ dysfunction, and death. Adam Ibarra understands and agrees to proceed. We will schedule surgery for next Thursday.

## 2012-02-06 NOTE — Procedures (Signed)
Extubation Procedure Note  Patient Details:   Name: Adam Ibarra DOB: 10-27-1950 MRN: [id number]   Airway Documentation:  Airway 8 mm (Active)  Secured at (cm) 22 cm 02/06/2012  8:10 PM  Measured From Lips 02/06/2012  8:10 PM  Secured Location Right 02/06/2012  8:10 PM  Secured By [employer] Tape 02/06/2012  8:10 PM  Cuff Pressure (cm H2O) 26 cm H2O 02/06/2012  2:15 PM  Site Condition Dry 02/06/2012  2:15 PM    Evaluation  O2 sats: stable throughout Complications: No apparent complications Patient did tolerate procedure well. Bilateral Breath Sounds: Clear;Diminished   Yes  ABG    Component Value Date/Time   PHART 7.352 02/06/2012 2224   PCO2ART 39.8 02/06/2012 2224   PO2ART 116.0* 02/06/2012 2224   HCO3 22.2 02/06/2012 2224   TCO2 23 02/06/2012 2224   ACIDBASEDEF 3.0* 02/06/2012 2224   O2SAT 98.0 02/06/2012 2224    Prior to extubation NIF -35, VC and cuff leak was present. Pt then extubated and placed on 3L [place]. RT and RN will continue to monitor.   Driscilla Grammes 02/06/2012, 10:41 PM

## 2012-02-07 ENCOUNTER — Inpatient Hospital Stay (HOSPITAL_COMMUNITY): Payer: 59

## 2012-02-07 LAB — BASIC METABOLIC PANEL
CO2: 22 mEq/L (ref 19–32)
Calcium: 7.8 mg/dL — ABNORMAL LOW (ref 8.4–10.5)
GFR calc non Af Amer: 56 mL/min — ABNORMAL LOW (ref >90)
Glucose, Bld: 137 mg/dL — ABNORMAL HIGH (ref 70–99)
Potassium: 4.5 mEq/L (ref 3.5–5.1)
Sodium: 135 mEq/L (ref 135–145)

## 2012-02-07 LAB — CBC
Hemoglobin: 9 g/dL — ABNORMAL LOW (ref 13.0–17.0)
Hemoglobin: 9.2 g/dL — ABNORMAL LOW (ref 13.0–17.0)
MCH: 28.9 pg (ref 26.0–34.0)
MCHC: 33.8 g/dL (ref 30.0–36.0)
Platelets: 110 10*3/uL — ABNORMAL LOW (ref 150–400)
Platelets: 120 10*3/uL — ABNORMAL LOW (ref 150–400)
RBC: 3.15 MIL/uL — ABNORMAL LOW (ref 4.22–5.81)
RDW: 13.9 % (ref 11.5–15.5)

## 2012-02-07 LAB — POCT I-STAT, CHEM 8
HCT: 27 % — ABNORMAL LOW (ref 39.0–52.0)
Hemoglobin: 9.2 g/dL — ABNORMAL LOW (ref 13.0–17.0)
Potassium: 3.9 mEq/L (ref 3.5–5.1)
Sodium: 139 mEq/L (ref 135–145)

## 2012-02-07 LAB — CREATININE, SERUM
Creatinine, Ser: 1.3 mg/dL (ref 0.50–1.35)
GFR calc non Af Amer: 57 mL/min — ABNORMAL LOW (ref >90)

## 2012-02-07 LAB — GLUCOSE, CAPILLARY
Glucose-Capillary: 119 mg/dL — ABNORMAL HIGH (ref 70–99)
Glucose-Capillary: 146 mg/dL — ABNORMAL HIGH (ref 70–99)
Glucose-Capillary: 127 mg/dL — ABNORMAL HIGH (ref 70–99)

## 2012-02-07 LAB — TYPE AND SCREEN: Unit division: 0

## 2012-02-07 LAB — MAGNESIUM
Magnesium: 2.3 mg/dL (ref 1.5–2.5)
Magnesium: 2.2 mg/dL (ref 1.5–2.5)

## 2012-02-07 MED ORDER — TRAMADOL HCL 50 MG PO TABS
50.0000 mg | ORAL_TABLET | Freq: Four times a day (QID) | ORAL | Status: DC | PRN
  Administered 2012-02-07 – 2012-02-08 (×3): 50 mg via ORAL
  Filled 2012-02-07 (×3): qty 1

## 2012-02-07 MED ORDER — FUROSEMIDE 10 MG/ML IJ SOLN
40.0000 mg | Freq: Two times a day (BID) | INTRAMUSCULAR | Status: AC
  Administered 2012-02-07 – 2012-02-08 (×3): 40 mg via INTRAVENOUS
  Filled 2012-02-07: qty 4

## 2012-02-07 MED ORDER — INSULIN ASPART 100 UNIT/ML ~~LOC~~ SOLN: 0.0000 [IU] | SUBCUTANEOUS | Status: DC

## 2012-02-07 MED ORDER — INSULIN GLARGINE 100 UNIT/ML ~~LOC~~ SOLN
24.0000 [IU] | Freq: Two times a day (BID) | SUBCUTANEOUS | Status: DC
  Administered 2012-02-07: 24 [IU] via SUBCUTANEOUS

## 2012-02-07 MED ORDER — INSULIN GLARGINE 100 UNIT/ML ~~LOC~~ SOLN
14.0000 [IU] | SUBCUTANEOUS | Status: DC
Start: 2012-02-07 — End: 2012-02-07
  Administered 2012-02-07: 14 [IU] via SUBCUTANEOUS

## 2012-02-07 NOTE — Op Note (Signed)
NAMELEGION, DISCHER NO.:  [id number]  MEDICAL RECORD NO.:  [id number]  LOCATION:  2308                         FACILITY:  [facility]  PHYSICIAN:  Evelene Croon, M.D.     DATE OF BIRTH:  [birth year]  DATE OF PROCEDURE:  02/06/2012 DATE OF DISCHARGE:                              OPERATIVE REPORT   PREOPERATIVE DIAGNOSES: 1. Severe 3-vessel coronary artery disease. 2. Moderate aortic stenosis 3. Interstitial lung disease.  POSTOPERATIVE DIAGNOSES: 1. Severe 3-vessel coronary artery disease. 2. Moderate aortic stenosis 3. Interstitial lung disease.  OPERATIVE PROCEDURE:  Median sternotomy, extracorporeal circulation, coronary artery bypass graft surgery x3 using a left internal mammary artery graft to left anterior descending coronary artery, with a saphenous vein graft to the obtuse marginal branch of left circumflex coronary artery, and a saphenous vein graft to the posterior descending branch of the right coronary artery.  Aortic valve replacement using a 23-mm St. Jude Regent mechanical valve.  Wedge biopsy of right upper lobe of the lung.  ATTENDING SURGEON:  Evelene Croon, MD  ASSISTANT:  Coral Ceo, PA-C  ANESTHESIA:  General endotracheal.  CLINICAL HISTORY:  This patient is a 61 year old gentleman with history of type 2 diabetes, hypertension, hyperlipidemia, and obesity who presented with about a 1 year history of exertional substernal chest pain and shortness of breath.  Over the past few months, he has had worsening symptoms and is now having substernal chest pain with mild activity such as walking to his mailbox.  He had a nuclear stress test in January which showed a fixed inferior wall defect without reversibility.  An echocardiogram showed normal left ventricular systolic function with mild aortic stenosis with mildly calcified leaflets.  The aortic valve area was 1.91 cm2 by VTI and 1.26 cm2 by V max.  Mean gradient was 16 and peak gradient  was 36.  He recently underwent cardiac catheterizations at [facility] which showed severe 3-vessel coronary artery disease with an occluded right coronary artery supplied distally by collaterals from the right and left.  After review of the studies and examination of the patient, I felt that coronary artery bypass graft surgery is the best treatment to prevent further ischemia and infarction and improve his quality of life. His aortic stenosis appeared to be mild by preoperative echocardiogram and I told him that we would further evaluate this in the operating room with TEE to make a decision whether valve replacement is indicated.  I was concerned about his mean gradient of 16 and peak gradient of 36 on his preoperative echo, although reportedly his leaflets are only mildly calcified.  I discussed the operative procedure with the patient including alternatives, benefits, and risks including, but not limited to bleeding, blood transfusion, infection, stroke, myocardial infarction, heart block requiring permanent pacemaker, organ dysfunction, and death.  Also discussed the pros and cons of mechanical valves and tissue valves with the patient.  I recommended a mechanical valve if we felt valve replacement was necessary given his relatively young age.  He understood all this and agreed to proceed.  OPERATIVE PROCEDURE:  The patient was taken to the operating room and placed on the table in supine  position.  After induction of general endotracheal anesthesia, a Foley catheter placed in bladder using sterile technique.  Then, TEE was performed by Anesthesiology.  This was read by Dr. Kipp Brood and reviewed by me.  The aortic valve was trileaflet valve with significant calcification of the leaflets with restricted mobility.  The valve area was 1.0 by continuity equation and 1.1 by planimetry.  The mean gradient was about 15 and the peak gradient about 35.  Left  ventricular function was well preserved.  There was no significant mitral regurgitation.  I felt that aortic valve replacement should be performed at this time given that he had moderate aortic stenosis by our intraoperative criteria with significant calcification and restricted mobility of all 3 leaflets.  I felt that his risk for progressive aortic stenosis requiring a redo operation for aortic valve replacement was significant over the next couple of years.  Then, the chest, abdomen, and both lower extremities were prepped and draped in usual sterile manner.  The chest was entered through a median sternotomy incision and the pericardium opened midline.  Examination of the heart showed good ventricular contractility.  The ascending aorta was of normal size and had no palpable plaques in it.  Then, the left internal mammary artery was harvested from chest wall as a pedicle graft.  This was a large caliber vessel with excellent blood flow through it.  At the same time, a segment of greater saphenous vein was harvested from the right leg using endoscopic vein harvest technique.  This vein was of medium to large size and good quality.  Then, the patient was heparinized and when an adequate ACT was obtained, the distal ascending aorta was cannulated using a 20-French aortic cannula for arterial inflow.  Venous outflow was achieved using a 2- stage venous cannula for the right atrial appendage.  An antegrade cardioplegia and vent cannula was inserted in the aortic root.  A retrograde cardioplegia cannula was inserted through a pursestring suture in the right atrium and advanced in the coronary sinus without difficulty.  The left ventricular vent was placed through the right superior pulmonary vein.  The patient was placed on cardiopulmonary bypass and distal coronary artery was identified.  The LAD was heavily diseased throughout its proximal portion.  In the midportion, there was mild  disease present. Beyond the midportion, the vessel became small down to the apex.  There were 2 diagonal branches that came off proximally which were small and diffusely diseased and not felt to be graftable.  The left circumflex had a single large marginal branch that was suitable for grafting. There were 3 smaller marginal branches beyond this, which does not appear to be of graftable size and all fairly diffusely diseased.  The right coronary artery was diffusely diseased with calcific plaque.  This extended down to the takeoff of the posterior descending artery. Posterior descending itself was graftable and a moderate sized vessel.  Then, the aorta was crossclamped and 1000 mL of cold blood antegrade cardioplegia was administered in the aortic root with quick arrest of the heart.  Systemic hypothermia to 32 degrees centigrade and topical hypothermia with iced saline was used.  A temperature probe was placed in septum and insulating pad in the pericardium.  The first distal anastomosis was performed at the posterior descending coronary artery.  The internal diameter of this vessel was 1.6 mm.  The conduit used was a segment of greater saphenous vein and the anastomosis performed in end-to-side manner using continuous 7-0  Prolene suture. Flow was noted through the graft and was excellent.  The second distal anastomosis was performed at the obtuse marginal branch.  The internal diameter of this vessel was about 1.75 mm.  The conduit used was a second segment of greater saphenous vein and anastomosis performed in end-to-side manner using continuous 7-0 Prolene suture.  Flow was noted through the graft and was excellent.  Then, another dose of cardioplegia was given down the vein grafts and aortic root.  The third distal anastomosis was performed to the mid LAD.  The internal diameter was 1.75 mm.  The conduit used was the left internal mammary graft and this was brought through an  opening of left pericardium anterior to the phrenic nerve.  This was anastomosed to LAD in end-to- side manner using continuous 8-0 Prolene suture.  The pedicle was sutured to the epicardium with 6-0 Prolene sutures.  Then, another dose of cardioplegia was given.  Then, attention was turned to aortic valve replacement.  Throughout the remainder of the procedure, cold blood retrograde cardioplegia was given at about 20 minute intervals.  The aorta was opened transversely just above the sinotubular junction.  Examination of the native valve showed there were 3 leaflets.  There was significant calcification of the proximal portion of all 3 leaflets extending out from the annulus.  Only about the distal 1/2 of each leaflet would open.  The native valve leaflets were excised.  The anulus was decalcified with rongeurs.  Care was taken to remove all particulate debris.  The left ventricle and aortic root were irrigated with iced saline solution and directly inspected for any debris.  Then, the anulus was sized and a 23 mm St. Jude Regent mechanical valve was chosen.  This had reference #[id number]-751, serial [id number].  Then, a series of pledgeted 2-0 Ethibond horizontal mattress sutures were placed around the aortic anulus with the pledgets in the subannular position.  The sutures were placed through the sewing ring and the valve lowered in place.  The sutures were tied sequentially.  The valve seated nicely and the leaflets were moving normally.  The right and left coronary ostia were not obstructed.  The patient was then rewarmed to 37 degrees centigrade.  The aortotomy was closed in 2 layers using continuous 4-0 Prolene with felt strips to reinforce the closure.  Then, the 2 proximal vein graft anastomoses were performed at the mid ascending aorta in an end-to-side manner using continuous 6-0 Prolene suture.  Then, the clamp was removed from the mammary pedicle.  There was rapid warming of the  ventricular septum and return of spontaneous ventricular fibrillation.  The left side of the heart was de-aired.  The head was placed in Trendelenburg position.  The cross-clamp was removed with a time of 143 minutes.  There was spontaneous return of sinus rhythm.  The proximal and distal anastomoses appeared hemostatic and allowed the grafts satisfactory.  Graft marker was placed around the proximal anastomosis.  Two temporary right ventricular and right atrial pacing wires placed and brought out through the skin.  When the patient was rewarmed to 37 degrees centigrade, he was weaned from cardiopulmonary bypass on no inotropic agents.  Total bypass time was 170 minutes.  Cardiac function appeared excellent with cardiac output of 5 liters/minute.  TEE showed a normal functioning aortic valve prosthesis.  There was no evidence of perivalvular leak.  Cardiac function appeared normal.  Then, protamine was given and the venous and aortic cannulas were removed without difficulty.  Hemostasis was achieved.  Since the patient had honeycombing of the lungs on CT scan and there was question of pulmonary fibrosis, I felt it would be best to go ahead and do a wedge biopsy of the lung at this time.  The right pleural space was opened and a wedge biopsy was taken of the anterior aspect of the right upper lobe in an area that grossly appeared to have a cobblestone appearance externally.  This was sent to Pathology for permanent examination.  There was no air leak present.  Then 4 chest tubes were placed with bilateral pleural tubes with 2 in the posterior pericardium, 1 in the anterior mediastinum.  The sternum was then reapproximated with double #6 stainless steel wires.  The fascia was closed with continuous #1 Vicryl suture.  Subcutaneous tissue was closed with continuous 2-0 Vicryl and the skin with a 3-0 Vicryl subcuticular closure.  The lower extremity vein harvest site was closed in layers in  a similar manner.  The sponge, needle, and instrument counts were correct according to the scrub nurse.  Dry sterile dressing was applied over the incisions around chest tubes, which were hooked to Pleur-Evac suction. The patient remained hemodynamically stable and was transferred to the SICU in guarded, but stable condition.     Evelene Croon, M.D.     BB/MEDQ  D:  02/06/2012  T:  02/07/2012  Job:  [id number]

## 2012-02-07 NOTE — Progress Notes (Signed)
1 Day Post-Op Procedure(s) (LRB): CORONARY ARTERY BYPASS GRAFTING (CABG) (N/A) AORTIC VALVE REPLACEMENT (AVR) (N/A) Subjective: Doing well status post aVR-CABG Ambulated in the hallway Maintaining sinus rhythm Still requires IV insulin drip 9 units per hour will increase Lantus dosing  Objective: Vital signs in last 24 hours: Temp:  [96.8 F (36 C)-99.5 F (37.5 C)] 98.6 F (37 C) (12/21 1650) Pulse Rate:  [79-101] 89  (12/21 1800) Cardiac Rhythm:  [-] Normal sinus rhythm (12/21 1800) Resp:  [14-27] 21  (12/21 1800) BP: (91-150)/(51-80) 133/62 mmHg (12/21 1800) SpO2:  [94 %-100 %] 99 % (12/21 1800) Arterial Line BP: (106-176)/(48-62) 175/62 mmHg (12/21 0900) FiO2 (%):  [40 %-50.2 %] 40.2 % (12/20 2230) Weight:  [296 lb 4.8 oz (134.4 kg)] 296 lb 4.8 oz (134.4 kg) (12/21 0458)  Hemodynamic parameters for last 24 hours: PAP: (26-46)/(12-21) 39/18 mmHg CO:  [5.1 L/min-5.6 L/min] 5.6 L/min CI:  [2.1 L/min/m2-2.3 L/min/m2] 2.3 L/min/m2  Intake/Output from previous day: 12/20 0701 - 12/21 0700 In: 7050.9 [I.V.:4799.9; Blood:551; NG/GT:60; IV Piggyback:1640] Out: 4045 [Urine:2135; Emesis/NG output:150; Blood:1250; Chest Tube:510] Intake/Output this shift: Total I/O In: 1090.2 [P.O.:700; I.V.:336.2; IV Piggyback:54] Out: 785 [Urine:470; Chest Tube:315]  Extremities warm lungs clear  Lab Results:  Basename 02/07/12 1737 02/07/12 1700 02/07/12 0407  WBC -- 15.1* 15.0*  HGB 9.2* 9.2* --  HCT 27.0* 27.2* --  PLT -- 120* 110*   BMET:  Basename 02/07/12 1737 02/07/12 0407  NA 139 135  K 3.9 4.5  CL 102 102  CO2 -- 22  GLUCOSE 103* 137*  BUN 15 15  CREATININE 1.20 1.32  CALCIUM -- 7.8*    PT/INR:  Basename 02/06/12 1431  LABPROT 17.2*  INR 1.44   ABG    Component Value Date/Time   PHART 7.352 02/06/2012 2224   HCO3 22.2 02/06/2012 2224   TCO2 24 02/07/2012 1737   ACIDBASEDEF 3.0* 02/06/2012 2224   O2SAT 98.0 02/06/2012 2224   CBG (last 3)   Basename 02/07/12  0807 02/07/12 0609 02/07/12 0506  GLUCAP 126* 127* 146*    Assessment/Plan: S/P Procedure(s) (LRB): CORONARY ARTERY BYPASS GRAFTING (CABG) (N/A) AORTIC VALVE REPLACEMENT (AVR) (N/A) Patient examined and record reviewed.Hemodynamics stable,labs satisfactory.Patient had stable day.Continue current care. VAN TRIGT III,PETER 02/07/2012      LOS: 1 day    VAN TRIGT III,PETER 02/07/2012

## 2012-02-07 NOTE — Progress Notes (Signed)
Nutrition Brief Note  Patient identified on the Malnutrition Screening Tool (MST) Report  Body mass index is 40.19 kg/(m^2). Pt meets criteria for class III extreme obesity based on current BMI.   Current diet order is clear liquid, patient is currently working on finishing his first tray. Labs and medications reviewed. Pt reports eating well PTA, 2 meals/day. Pt reports 8-10 pound intentional weight loss in the past month r/t walking more and eating healthier. Pt tolerating clear liquid diet. Likely diet to be advanced soon per MD as pt doing well after CABG yesterday.   No nutrition interventions warranted at this time. If nutrition issues arise, please consult RD.   Levon Hedger MS, RD, LDN [phone] Weekend/After Hours Pager

## 2012-02-07 NOTE — Progress Notes (Signed)
1 Day Post-Op Procedure(s) (LRB): CORONARY ARTERY BYPASS GRAFTING (CABG) (N/A) AORTIC VALVE REPLACEMENT (AVR) (N/A) Subjective: AVR-CABG DM NSR  Objective: Vital signs in last 24 hours: Temp:  [96.4 F (35.8 C)-99.5 F (37.5 C)] 99.3 F (37.4 C) (12/21 0900) Pulse Rate:  [79-101] 101  (12/21 0900) Cardiac Rhythm:  [-] A-V Sequential paced (12/21 0900) Resp:  [10-27] 22  (12/21 0900) BP: (78-150)/(44-67) 150/67 mmHg (12/21 0900) SpO2:  [94 %-100 %] 97 % (12/21 0900) Arterial Line BP: (77-176)/(35-62) 175/62 mmHg (12/21 0900) FiO2 (%):  [40 %-50.7 %] 40.2 % (12/20 2230) Weight:  [277 lb 12.5 oz (126 kg)-296 lb 4.8 oz (134.4 kg)] 296 lb 4.8 oz (134.4 kg) (12/21 0458)  Hemodynamic parameters for last 24 hours: PAP: (26-46)/(12-24) 39/18 mmHg CO:  [4.8 L/min-5.7 L/min] 5.6 L/min CI:  [1.9 L/min/m2-2.3 L/min/m2] 2.3 L/min/m2  Intake/Output from previous day: 12/20 0701 - 12/21 0700 In: 7050.9 [I.V.:4799.9; Blood:551; NG/GT:60; IV Piggyback:1640] Out: 4045 [Urine:2135; Emesis/NG output:150; Blood:1250; Chest Tube:510] Intake/Output this shift: Total I/O In: 494.6 [P.O.:350; I.V.:94.6; IV Piggyback:50] Out: 270 [Urine:140; Chest Tube:130]  Sharp click valve closure  Lab Results:  Basename 02/07/12 0407 02/06/12 2122  WBC 15.0* 12.6*  HGB 9.0* 9.0*  HCT 26.6* 26.0*  PLT 110* 106*   BMET:  Basename 02/07/12 0407 02/06/12 2122 02/06/12 2111  NA 135 -- 139  K 4.5 -- 4.5  CL 102 -- 104  CO2 22 -- --  GLUCOSE 137* -- 123*  BUN 15 -- 16  CREATININE 1.32 1.37* --  CALCIUM 7.8* -- --    PT/INR:  Basename 02/06/12 1431  LABPROT 17.2*  INR 1.44   ABG    Component Value Date/Time   PHART 7.352 02/06/2012 2224   HCO3 22.2 02/06/2012 2224   TCO2 23 02/06/2012 2224   ACIDBASEDEF 3.0* 02/06/2012 2224   O2SAT 98.0 02/06/2012 2224   CBG (last 3)   Basename 02/07/12 0807 02/07/12 0609 02/07/12 0506  GLUCAP 126* 127* 146*    Assessment/Plan: S/P Procedure(s)  (LRB): CORONARY ARTERY BYPASS GRAFTING (CABG) (N/A) AORTIC VALVE REPLACEMENT (AVR) (N/A) See progression orders   LOS: 1 day    VAN TRIGT III,PETER 02/07/2012

## 2012-02-07 NOTE — Progress Notes (Signed)
Anesthesiology Follow-up:  Awake, alert, neuro intact. C/O of some incisional soreness, hemodynamically stable.  VS: T- 37.3 BP- 127/61 HR 91 (SR) RR 21   H/H: 9.0/26.6 Plts: 110,000 K-4.5 BUN/Cr. 15/1.32 glucose 137   Extubated 8 hours post-op.  Doing well overall  Kipp Brood, MD

## 2012-02-08 ENCOUNTER — Inpatient Hospital Stay (HOSPITAL_COMMUNITY): Payer: 59

## 2012-02-08 LAB — GLUCOSE, CAPILLARY
Glucose-Capillary: 110 mg/dL — ABNORMAL HIGH (ref 70–99)
Glucose-Capillary: 121 mg/dL — ABNORMAL HIGH (ref 70–99)
Glucose-Capillary: 122 mg/dL — ABNORMAL HIGH (ref 70–99)
Glucose-Capillary: 126 mg/dL — ABNORMAL HIGH (ref 70–99)
Glucose-Capillary: 126 mg/dL — ABNORMAL HIGH (ref 70–99)
Glucose-Capillary: 143 mg/dL — ABNORMAL HIGH (ref 70–99)
Glucose-Capillary: 146 mg/dL — ABNORMAL HIGH (ref 70–99)
Glucose-Capillary: 154 mg/dL — ABNORMAL HIGH (ref 70–99)
Glucose-Capillary: 159 mg/dL — ABNORMAL HIGH (ref 70–99)
Glucose-Capillary: 174 mg/dL — ABNORMAL HIGH (ref 70–99)
Glucose-Capillary: 207 mg/dL — ABNORMAL HIGH (ref 70–99)
Glucose-Capillary: 86 mg/dL (ref 70–99)
Glucose-Capillary: 99 mg/dL (ref 70–99)

## 2012-02-08 LAB — CBC
HCT: 26.6 % — ABNORMAL LOW (ref 39.0–52.0)
Hemoglobin: 8.9 g/dL — ABNORMAL LOW (ref 13.0–17.0)
MCH: 28.6 pg (ref 26.0–34.0)
MCHC: 33.5 g/dL (ref 30.0–36.0)
MCV: 85.5 fL (ref 78.0–100.0)
Platelets: 121 10*3/uL — ABNORMAL LOW (ref 150–400)
RBC: 3.11 MIL/uL — ABNORMAL LOW (ref 4.22–5.81)
RDW: 13.9 % (ref 11.5–15.5)
WBC: 16.7 10*3/uL — ABNORMAL HIGH (ref 4.0–10.5)

## 2012-02-08 LAB — BASIC METABOLIC PANEL
BUN: 13 mg/dL (ref 6–23)
CO2: 25 mEq/L (ref 19–32)
Calcium: 7.9 mg/dL — ABNORMAL LOW (ref 8.4–10.5)
Chloride: 98 mEq/L (ref 96–112)
Creatinine, Ser: 1.21 mg/dL (ref 0.50–1.35)
GFR calc Af Amer: 72 mL/min — ABNORMAL LOW (ref >90)
GFR calc non Af Amer: 62 mL/min — ABNORMAL LOW (ref >90)
Glucose, Bld: 115 mg/dL — ABNORMAL HIGH (ref 70–99)
Potassium: 3.8 mEq/L (ref 3.5–5.1)
Sodium: 134 mEq/L — ABNORMAL LOW (ref 135–145)

## 2012-02-08 LAB — PROTIME-INR
INR: 1.24 (ref 0.00–1.49)
Prothrombin Time: 15.4 seconds — ABNORMAL HIGH (ref 11.6–15.2)

## 2012-02-08 MED ORDER — TRAMADOL HCL 50 MG PO TABS
50.0000 mg | ORAL_TABLET | ORAL | Status: DC | PRN
  Administered 2012-02-08: 100 mg via ORAL
  Administered 2012-02-09: 50 mg via ORAL
  Filled 2012-02-08 (×2): qty 2

## 2012-02-08 MED ORDER — INSULIN ASPART 100 UNIT/ML ~~LOC~~ SOLN
0.0000 [IU] | Freq: Three times a day (TID) | SUBCUTANEOUS | Status: DC
  Administered 2012-02-08: 5 [IU] via SUBCUTANEOUS
  Administered 2012-02-09: 3 [IU] via SUBCUTANEOUS
  Administered 2012-02-09: 2 [IU] via SUBCUTANEOUS

## 2012-02-08 MED ORDER — INSULIN ASPART 100 UNIT/ML ~~LOC~~ SOLN: 0.0000 [IU] | Freq: Every day | SUBCUTANEOUS | Status: DC

## 2012-02-08 MED ORDER — INSULIN GLARGINE 100 UNIT/ML ~~LOC~~ SOLN
20.0000 [IU] | Freq: Two times a day (BID) | SUBCUTANEOUS | Status: DC
  Administered 2012-02-08 – 2012-02-10 (×6): 20 [IU] via SUBCUTANEOUS
  Administered 2012-02-11: 100 [IU] via SUBCUTANEOUS
  Administered 2012-02-11 – 2012-02-13 (×4): 20 [IU] via SUBCUTANEOUS

## 2012-02-08 MED ORDER — LOSARTAN POTASSIUM 50 MG PO TABS
50.0000 mg | ORAL_TABLET | Freq: Every day | ORAL | Status: DC
  Administered 2012-02-08 – 2012-02-10 (×3): 50 mg via ORAL
  Filled 2012-02-08 (×4): qty 1

## 2012-02-08 MED ORDER — SODIUM CHLORIDE 0.9 % IJ SOLN: 3.0000 mL | INTRAMUSCULAR | Status: DC | PRN

## 2012-02-08 MED ORDER — WARFARIN SODIUM 5 MG PO TABS
5.0000 mg | ORAL_TABLET | Freq: Every day | ORAL | Status: AC
  Administered 2012-02-08: 5 mg via ORAL
  Filled 2012-02-08: qty 1

## 2012-02-08 MED ORDER — POTASSIUM CHLORIDE 10 MEQ/50ML IV SOLN
10.0000 meq | INTRAVENOUS | Status: AC
  Administered 2012-02-08 (×2): 10 meq via INTRAVENOUS

## 2012-02-08 MED ORDER — METOPROLOL TARTRATE 25 MG PO TABS
25.0000 mg | ORAL_TABLET | Freq: Two times a day (BID) | ORAL | Status: DC
  Administered 2012-02-08 (×2): 25 mg via ORAL
  Filled 2012-02-08 (×4): qty 1

## 2012-02-08 MED ORDER — ASPIRIN EC 325 MG PO TBEC
325.0000 mg | DELAYED_RELEASE_TABLET | Freq: Every day | ORAL | Status: DC
  Filled 2012-02-08: qty 1

## 2012-02-08 MED ORDER — ACETAMINOPHEN 325 MG PO TABS
650.0000 mg | ORAL_TABLET | Freq: Four times a day (QID) | ORAL | Status: DC | PRN
  Administered 2012-02-09: 650 mg via ORAL
  Filled 2012-02-08: qty 2

## 2012-02-08 MED ORDER — SODIUM CHLORIDE 0.9 % IJ SOLN
3.0000 mL | Freq: Two times a day (BID) | INTRAMUSCULAR | Status: DC
  Administered 2012-02-08 – 2012-02-10 (×5): 3 mL via INTRAVENOUS

## 2012-02-08 MED ORDER — SODIUM CHLORIDE 0.9 % IV SOLN: 250.0000 mL | INTRAVENOUS | Status: DC | PRN

## 2012-02-08 MED ORDER — FUROSEMIDE 80 MG PO TABS
80.0000 mg | ORAL_TABLET | Freq: Every day | ORAL | Status: DC
  Administered 2012-02-09 – 2012-02-13 (×5): 80 mg via ORAL
  Filled 2012-02-08 (×5): qty 1

## 2012-02-08 MED ORDER — INSULIN ASPART 100 UNIT/ML ~~LOC~~ SOLN
0.0000 [IU] | Freq: Three times a day (TID) | SUBCUTANEOUS | Status: DC
  Administered 2012-02-08: 2 [IU] via SUBCUTANEOUS
  Administered 2012-02-09: 4 [IU] via SUBCUTANEOUS
  Administered 2012-02-10: 2 [IU] via SUBCUTANEOUS
  Administered 2012-02-10: 07:00:00 via SUBCUTANEOUS
  Administered 2012-02-10 – 2012-02-11 (×3): 2 [IU] via SUBCUTANEOUS

## 2012-02-08 MED ORDER — MOVING RIGHT ALONG BOOK
Freq: Once | Status: AC
  Administered 2012-02-08: 12:00:00
  Filled 2012-02-08: qty 1

## 2012-02-08 MED ORDER — POTASSIUM CHLORIDE CRYS ER 20 MEQ PO TBCR
20.0000 meq | EXTENDED_RELEASE_TABLET | Freq: Every day | ORAL | Status: DC
  Administered 2012-02-08 – 2012-02-09 (×2): 20 meq via ORAL
  Filled 2012-02-08 (×3): qty 1

## 2012-02-08 MED ORDER — METOPROLOL TARTRATE 25 MG/10 ML ORAL SUSPENSION
12.5000 mg | Freq: Two times a day (BID) | ORAL | Status: DC
  Filled 2012-02-08 (×2): qty 5

## 2012-02-08 MED ORDER — OXYCODONE HCL 5 MG PO TABS: 5.0000 mg | ORAL_TABLET | ORAL | Status: DC | PRN

## 2012-02-08 MED ORDER — PANTOPRAZOLE SODIUM 40 MG PO TBEC
40.0000 mg | DELAYED_RELEASE_TABLET | Freq: Every day | ORAL | Status: DC
  Filled 2012-02-08 (×3): qty 1

## 2012-02-08 MED ORDER — WARFARIN - PHYSICIAN DOSING INPATIENT
Freq: Every day | Status: DC
  Administered 2012-02-08: 18:00:00

## 2012-02-08 MED ORDER — TRAMADOL HCL 50 MG PO TABS
100.0000 mg | ORAL_TABLET | Freq: Four times a day (QID) | ORAL | Status: DC | PRN
  Administered 2012-02-08: 50 mg via ORAL
  Filled 2012-02-08: qty 1

## 2012-02-08 MED ORDER — MAGNESIUM HYDROXIDE 400 MG/5ML PO SUSP: 30.0000 mL | Freq: Every day | ORAL | Status: DC | PRN

## 2012-02-08 MED ORDER — GUAIFENESIN ER 600 MG PO TB12
600.0000 mg | ORAL_TABLET | Freq: Two times a day (BID) | ORAL | Status: DC | PRN
  Filled 2012-02-08: qty 1

## 2012-02-08 MED ORDER — INSULIN ASPART 100 UNIT/ML ~~LOC~~ SOLN
4.0000 [IU] | Freq: Three times a day (TID) | SUBCUTANEOUS | Status: DC
  Administered 2012-02-08: 4 [IU] via SUBCUTANEOUS

## 2012-02-08 NOTE — Progress Notes (Signed)
2 Days Post-Op Procedure(s) (LRB): CORONARY ARTERY BYPASS GRAFTING (CABG) (N/A) AORTIC VALVE REPLACEMENT (AVR) (N/A) Subjective: Mechanical aVR-CABG and obese diabetic with hypertension Blood glucose now controlled off IV insulin with Lantus twice a day combined with sliding scale Remove remaining chest tubes today continue IV Lasix diuresis weight 300 pounds Ready for transfer to step down when bed available Maintaining sinus rhythm Walked 300 feet today   Objective: Vital signs in last 24 hours: Temp:  [97.9 F (36.6 C)-98.6 F (37 C)] 98.2 F (36.8 C) (12/22 0805) Pulse Rate:  [86-106] 101  (12/22 1100) Cardiac Rhythm:  [-] Normal sinus rhythm (12/22 1000) Resp:  [18-28] 20  (12/22 1100) BP: (115-153)/(52-80) 142/70 mmHg (12/22 1100) SpO2:  [94 %-100 %] 97 % (12/22 1100) Weight:  [300 lb 4.3 oz (136.2 kg)] 300 lb 4.3 oz (136.2 kg) (12/22 0500)  Hemodynamic parameters for last 24 hours:   stable, sinus rhythm  Intake/Output from previous day: 12/21 0701 - 12/22 0700 In: 1889.5 [P.O.:1060; I.V.:725.5; IV Piggyback:104] Out: 3235 [Urine:2880; Chest Tube:355] Intake/Output this shift: Total I/O In: 494 [P.O.:360; I.V.:80; IV Piggyback:54] Out: 605 [Urine:535; Chest Tube:70]  Lungs clear incision is clean and dry     Lab Results:  Basename 02/08/12 0400 02/07/12 1737 02/07/12 1700  WBC 16.7* -- 15.1*  HGB 8.9* 9.2* --  HCT 26.6* 27.0* --  PLT 121* -- 120*   BMET:  Basename 02/08/12 0400 02/07/12 1737 02/07/12 0407  NA 134* 139 --  K 3.8 3.9 --  CL 98 102 --  CO2 25 -- 22  GLUCOSE 115* 103* --  BUN 13 15 --  CREATININE 1.21 1.20 --  CALCIUM 7.9* -- 7.8*    PT/INR:  Basename 02/08/12 0400  LABPROT 15.4*  INR 1.24   ABG    Component Value Date/Time   PHART 7.352 02/06/2012 2224   HCO3 22.2 02/06/2012 2224   TCO2 24 02/07/2012 1737   ACIDBASEDEF 3.0* 02/06/2012 2224   O2SAT 98.0 02/06/2012 2224   CBG (last 3)   Basename 02/08/12 0801 02/08/12  0007 02/07/12 2305  GLUCAP 154* 86 174*    Assessment/Plan: S/P Procedure(s) (LRB): CORONARY ARTERY BYPASS GRAFTING (CABG) (N/A) AORTIC VALVE REPLACEMENT (AVR) (N/A) Start Coumadin 5 mg daily and check INR daily Continue diuresis continue physical therapy-rehabilitation Transfer to step down orders written   LOS: 2 days    VAN TRIGT Ibarra,Adam Pitz 02/08/2012

## 2012-02-08 NOTE — Progress Notes (Signed)
Hypoglycemic Event  CBG: 66  Treatment: 15 GM carbohydrate snack  Symptoms: None  Follow-up CBG: Time: 0130 CBG Result:70  Possible Reasons for Event: Medication regimen: lantus increased/insulin gtt/not eating much  Comments/MD notified:not at this time   Adam Ibarra  Remember to initiate Hypoglycemia Order Set & complete

## 2012-02-08 NOTE — Progress Notes (Signed)
Report given via telephone to [place], RN on unit 2000.  Patient transferred to room 2015 via wheelchair on 1 liter [place].  Tolerated well.  Patient moved, with minimal assistance, from wheelchair to bed.  Positioned comfortably.  Left on 1 liter [place], laying supine with HOB elevated for comfort, SCD on LLE, A&Ox4, and Megan, RN and NT in room with patient.    Ulis Rias, RN

## 2012-02-09 ENCOUNTER — Encounter (HOSPITAL_COMMUNITY): Payer: Self-pay | Admitting: Surgery

## 2012-02-09 ENCOUNTER — Inpatient Hospital Stay (HOSPITAL_COMMUNITY): Payer: 59

## 2012-02-09 LAB — BASIC METABOLIC PANEL
BUN: 18 mg/dL (ref 6–23)
CO2: 26 mEq/L (ref 19–32)
Calcium: 8.4 mg/dL (ref 8.4–10.5)
Chloride: 98 mEq/L (ref 96–112)
Creatinine, Ser: 1.35 mg/dL (ref 0.50–1.35)
GFR calc Af Amer: 63 mL/min — ABNORMAL LOW (ref >90)
GFR calc non Af Amer: 55 mL/min — ABNORMAL LOW (ref >90)
Glucose, Bld: 128 mg/dL — ABNORMAL HIGH (ref 70–99)
Potassium: 4.2 mEq/L (ref 3.5–5.1)
Sodium: 134 mEq/L — ABNORMAL LOW (ref 135–145)

## 2012-02-09 LAB — GLUCOSE, CAPILLARY
Glucose-Capillary: 107 mg/dL — ABNORMAL HIGH (ref 70–99)
Glucose-Capillary: 168 mg/dL — ABNORMAL HIGH (ref 70–99)

## 2012-02-09 LAB — CBC
HCT: 26.5 % — ABNORMAL LOW (ref 39.0–52.0)
Hemoglobin: 8.9 g/dL — ABNORMAL LOW (ref 13.0–17.0)
MCH: 28.7 pg (ref 26.0–34.0)
MCHC: 33.6 g/dL (ref 30.0–36.0)
MCV: 85.5 fL (ref 78.0–100.0)
Platelets: 142 10*3/uL — ABNORMAL LOW (ref 150–400)
RBC: 3.1 MIL/uL — ABNORMAL LOW (ref 4.22–5.81)
RDW: 14.1 % (ref 11.5–15.5)
WBC: 13.8 10*3/uL — ABNORMAL HIGH (ref 4.0–10.5)

## 2012-02-09 LAB — PROTIME-INR
INR: 1.24 (ref 0.00–1.49)
Prothrombin Time: 15.4 seconds — ABNORMAL HIGH (ref 11.6–15.2)

## 2012-02-09 MED ORDER — LEVALBUTEROL HCL 0.63 MG/3ML IN NEBU
0.6300 mg | INHALATION_SOLUTION | Freq: Three times a day (TID) | RESPIRATORY_TRACT | Status: DC
  Administered 2012-02-09 – 2012-02-10 (×4): 0.63 mg via RESPIRATORY_TRACT
  Filled 2012-02-09 (×7): qty 3

## 2012-02-09 MED ORDER — ASPIRIN EC 81 MG PO TBEC
81.0000 mg | DELAYED_RELEASE_TABLET | Freq: Every day | ORAL | Status: DC
  Administered 2012-02-10 – 2012-02-13 (×4): 81 mg via ORAL
  Filled 2012-02-09 (×4): qty 1

## 2012-02-09 MED ORDER — COUMADIN BOOK
Freq: Once | Status: AC
  Administered 2012-02-09: 08:00:00
  Filled 2012-02-09: qty 1

## 2012-02-09 MED ORDER — WARFARIN SODIUM 5 MG PO TABS
5.0000 mg | ORAL_TABLET | Freq: Every day | ORAL | Status: DC
  Administered 2012-02-09 – 2012-02-12 (×4): 5 mg via ORAL
  Filled 2012-02-09 (×5): qty 1

## 2012-02-09 MED ORDER — CARVEDILOL 25 MG PO TABS
25.0000 mg | ORAL_TABLET | Freq: Two times a day (BID) | ORAL | Status: DC
  Administered 2012-02-09 – 2012-02-13 (×8): 25 mg via ORAL
  Filled 2012-02-09 (×10): qty 1

## 2012-02-09 MED ORDER — WARFARIN VIDEO
Freq: Once | Status: AC
  Administered 2012-02-09: 08:00:00

## 2012-02-09 MED FILL — Albumin, Human Inj 5%: INTRAVENOUS | Qty: 250 | Status: AC

## 2012-02-09 MED FILL — Sodium Chloride IV Soln 0.9%: INTRAVENOUS | Qty: 1000 | Status: AC

## 2012-02-09 MED FILL — Electrolyte-R (PH 7.4) Solution: INTRAVENOUS | Qty: 5000 | Status: AC

## 2012-02-09 MED FILL — Sodium Chloride Irrigation Soln 0.9%: Qty: 3000 | Status: AC

## 2012-02-09 MED FILL — Lidocaine HCl IV Inj 20 MG/ML: INTRAVENOUS | Qty: 5 | Status: AC

## 2012-02-09 MED FILL — Heparin Sodium (Porcine) Inj 1000 Unit/ML: INTRAMUSCULAR | Qty: 10 | Status: AC

## 2012-02-09 MED FILL — Mannitol IV Soln 20%: INTRAVENOUS | Qty: 500 | Status: AC

## 2012-02-09 MED FILL — Heparin Sodium (Porcine) Inj 1000 Unit/ML: INTRAMUSCULAR | Qty: 30 | Status: AC

## 2012-02-09 NOTE — Progress Notes (Signed)
EPW d/c per MD order at 1440. All wires intact upon removal. Vital signs stable. Pt tolerated well. Bed rest until 1540. Call bell in reach. Dion Saucier

## 2012-02-09 NOTE — Progress Notes (Addendum)
3 Days Post-Op Procedure(s) (LRB): CORONARY ARTERY BYPASS GRAFTING (CABG) (N/A) AORTIC VALVE REPLACEMENT (AVR) (N/A) Subjective:  Adam Ibarra complains of chest discomfort and shortness of breath this morning.  He also has a productive cough with white sputum, but has difficulty getting it up.  He is ambulating some.  No BM, + Flatus  Objective: Vital signs in last 24 hours: Temp:  [97.5 F (36.4 C)-98.2 F (36.8 C)] 97.6 F (36.4 C) (12/23 0404) Pulse Rate:  [93-101] 93  (12/23 0404) Cardiac Rhythm:  [-] Sinus tachycardia (12/23 0800) Resp:  [18-26] 20  (12/23 0404) BP: (100-151)/(57-76) 136/76 mmHg (12/23 0404) SpO2:  [88 %-99 %] 95 % (12/23 0404) Weight:  [287 lb 4.2 oz (130.3 kg)] 287 lb 4.2 oz (130.3 kg) (12/23 0404)  Intake/Output from previous day: 12/22 0701 - 12/23 0700 In: 754 [P.O.:480; I.V.:120; IV Piggyback:154] Out: 2005 [Urine:1935; Chest Tube:70]  General appearance: alert, cooperative and no distress Heart: regular rate and rhythm Lungs: wheezes bilaterally Abdomen: soft, non-tender; bowel sounds normal; no masses,  no organomegaly Extremities: edema 1-2+ Wound: clean and dry  Lab Results:  Basename 02/09/12 0650 02/08/12 0400  WBC 13.8* 16.7*  HGB 8.9* 8.9*  HCT 26.5* 26.6*  PLT 142* 121*   BMET:  Basename 02/09/12 0650 02/08/12 0400  NA 134* 134*  K 4.2 3.8  CL 98 98  CO2 26 25  GLUCOSE 128* 115*  BUN 18 13  CREATININE 1.35 1.21  CALCIUM 8.4 7.9*    PT/INR:  Basename 02/09/12 0650  LABPROT 15.4*  INR 1.24   ABG    Component Value Date/Time   PHART 7.352 02/06/2012 2224   HCO3 22.2 02/06/2012 2224   TCO2 24 02/07/2012 1737   ACIDBASEDEF 3.0* 02/06/2012 2224   O2SAT 98.0 02/06/2012 2224   CBG (last 3)   Basename 02/09/12 0608 02/08/12 2219 02/08/12 2044  GLUCAP 107* 171* 161*    Assessment/Plan: S/P Procedure(s) (LRB): CORONARY ARTERY BYPASS GRAFTING (CABG) (N/A) AORTIC VALVE REPLACEMENT (AVR) (N/A)  1. CV- NSR pressure  elevated continue Lopressor, on Cozaar 50mg , will increase to 100 mg daily which was home dose 2. Pulm- patient with dyspnea, wheezing on exams will order Xopenex, wean oxygen continue IS 3. Renal- creatinine elevated, but appears to be patients baseline will monitor 4. INR 1.24 will continue 5mg  Coumadin 5. Acute post operative anemia- Hgb 8.9, stable 6. LOC Constipation- will order Lactulose prn 7. Dispo- will d/c EPW, order xopenex   LOS: 3 days    Lowella Dandy 02/09/2012    Chart reviewed, patient examined, agree with above. CXR looks ok. Will switch lopressor to Coreg to get more afterload reduction with HTN. He was on that preop.

## 2012-02-09 NOTE — Progress Notes (Signed)
CARDIAC REHAB PHASE I   PRE:  Rate/Rhythm: 103 ST   BP:  Supine:   Sitting: 150/60  Standing:    SaO2: 93 1 L NCC  MODE:  Ambulation: 200 ft   POST:  Rate/Rhythem: 128 ST   BP:  Supine:   Sitting: 168/71  Standing:    SaO2: 88 % 2L NCC  Patient ambulated in hall x2 assit with RW and 2L NCC. We placed patient on 2L NCC due to sats being at 91% on 1L It was expected to drop due to pt c/o dyspenic. Patient dropped down to 88% on 2L NCC. We will continue to follow patient and do his education when he is more ready. Patient felt tired and had already been in his recliner this AM so per pt request we placed him back in bed with call bell in reach.  [id number] Adam Ibarra, Adam Ibarra

## 2012-02-10 DIAGNOSIS — Z952 Presence of prosthetic heart valve: Secondary | ICD-10-CM

## 2012-02-10 DIAGNOSIS — Z951 Presence of aortocoronary bypass graft: Secondary | ICD-10-CM

## 2012-02-10 LAB — GLUCOSE, CAPILLARY
Glucose-Capillary: 121 mg/dL — ABNORMAL HIGH (ref 70–99)
Glucose-Capillary: 145 mg/dL — ABNORMAL HIGH (ref 70–99)
Glucose-Capillary: 151 mg/dL — ABNORMAL HIGH (ref 70–99)
Glucose-Capillary: 138 mg/dL — ABNORMAL HIGH (ref 70–99)

## 2012-02-10 LAB — BASIC METABOLIC PANEL
Chloride: 100 mEq/L (ref 96–112)
GFR calc non Af Amer: 59 mL/min — ABNORMAL LOW (ref >90)
Glucose, Bld: 119 mg/dL — ABNORMAL HIGH (ref 70–99)
Potassium: 3.3 mEq/L — ABNORMAL LOW (ref 3.5–5.1)
Sodium: 137 mEq/L (ref 135–145)

## 2012-02-10 LAB — PROTIME-INR
INR: 1.28 (ref 0.00–1.49)
Prothrombin Time: 15.7 seconds — ABNORMAL HIGH (ref 11.6–15.2)

## 2012-02-10 MED ORDER — WARFARIN SODIUM 5 MG PO TABS: 5.0000 mg | ORAL_TABLET | Freq: Every day | ORAL | Status: DC

## 2012-02-10 MED ORDER — POTASSIUM CHLORIDE CRYS ER 20 MEQ PO TBCR
40.0000 meq | EXTENDED_RELEASE_TABLET | Freq: Every day | ORAL | Status: DC
  Administered 2012-02-11 – 2012-02-13 (×3): 40 meq via ORAL
  Filled 2012-02-10 (×3): qty 2

## 2012-02-10 MED ORDER — LEVALBUTEROL HCL 0.63 MG/3ML IN NEBU
0.6300 mg | INHALATION_SOLUTION | Freq: Four times a day (QID) | RESPIRATORY_TRACT | Status: DC | PRN
  Filled 2012-02-10: qty 3

## 2012-02-10 MED ORDER — LEVALBUTEROL HCL 0.63 MG/3ML IN NEBU
0.6300 mg | INHALATION_SOLUTION | Freq: Two times a day (BID) | RESPIRATORY_TRACT | Status: DC
  Administered 2012-02-10 – 2012-02-13 (×6): 0.63 mg via RESPIRATORY_TRACT
  Filled 2012-02-10 (×8): qty 3

## 2012-02-10 MED ORDER — OXYCODONE HCL 5 MG PO TABS: 5.0000 mg | ORAL_TABLET | ORAL | Status: DC | PRN

## 2012-02-10 MED ORDER — POTASSIUM CHLORIDE CRYS ER 20 MEQ PO TBCR
40.0000 meq | EXTENDED_RELEASE_TABLET | Freq: Two times a day (BID) | ORAL | Status: AC
  Administered 2012-02-10 (×2): 40 meq via ORAL
  Filled 2012-02-10: qty 2
  Filled 2012-02-10: qty 1

## 2012-02-10 NOTE — Progress Notes (Signed)
Pt ambulated 300 ft with rolling walker. Pt tolerated ambulation well. Pt ambulated on room air oxygen saturations 95%. HR 102. Back to chair. Adam Ibarra

## 2012-02-10 NOTE — Progress Notes (Signed)
RT Note: placed pt on CPAP 11cmh20 with 1L 02 bleed in. Pt tolerating well, RT and RN will continue to monitor.

## 2012-02-10 NOTE — Progress Notes (Addendum)
CARDIAC REHAB PHASE I Reviewed patient education. Discussed exercise guidelines, cp, when to call the MD, heart healthy diet, restrictions, sternal precautions, IS use, orientation to outpatient cardiac rehab. Will send referral to [place]. Pt voices understanding. Patient at this time refused ambulation due to just finishing respiratory therapy. Pt was wore out. I advised him to walk at least 3 times today with staff on floor. We will continue to follow patient as needed.

## 2012-02-10 NOTE — Discharge Summary (Signed)
Physician Discharge Summary  Patient ID: Adam Ibarra MRN: [id number] DOB/AGE: [birth year] 61 y.o.  Admit date: 02/06/2012 Discharge date: 02/10/2012  Admission Diagnoses:  Patient Active Problem List  Diagnosis  . Aortic stenosis  . Angina, class III   Discharge Diagnoses:   Patient Active Problem List  Diagnosis  . Aortic stenosis  . Angina, class III  . S/P CABG x 3  . S/P AVR   Discharged Condition: good  History of Present Illness:   [name] is a 61 yo gentleman with history of diabetes, hypertension, hyperlipidemia, and obesity.  He was referred to Dr. Laneta Simmers for possible surgical evaluation.  The patient had a one year complaint of exertional substernal chest pain and shortness of breath.  However, over the past several months the patient noticed his symptoms have become worse and just walking to the mailbox can elicit chest discomfort.  He denies chest pain at rest.  He underwent stress testing in January which showed a fixed inferior wall defect that was not felt to be reversible.  Echocardiogram done showed a preserved LV function and some mild aortic stenosis.  The patient recently underwent cardiac catheterization on 01/23/2012 which showed severe three vessel disease.  He was evaluated by Dr. Laneta Simmers on 01/28/2012 at which time it was felt the patient would benefit from coronary bypass procedure.  It was felt that being his Aortic Stenosis was mild that valve replacement would not be beneficial at this time.  However, it was explained to the patient that Echocardiogram would be done in the operating room and if indicated he would replace the valve.   Finally review of the chest x-ray showed some evidence of interstitial lung disease and the patient is scheduled to follow up with pulmonary in January 2014.  The risks and benefits of the procedure were explained to the patient and he was agreeable to proceed with surgery, scheduled for 02/04/2012.   Hospital Course:   Mr.  Adam Ibarra presented to [facility] on 02/04/2012.  He was taken to the operating room.  Intraoperative TEE evaluation showed significant calcification of the aortic valve leaflets with restricted mobility.  It was felt the patients aortic stenosis had further progressed and valve replacement would be indicated at this time.  The patient underwent CABG x 3 utilizing LIMA to LAD, SVG to OM1 and SVG to PDA.  He underwent Aortic Valve replacement utilizing a 23mm St. Jude Mechanical Valve, and Wedge biopsy of the right upper lobe of the lung for suspicion of interstitial lung disease.  The patient tolerated the procedure well and was taken to the SICU in stable condition.  The patient was extubated the evening of surgery.  In the ICU patient was weaned off his insulin drip.  His chest tubes and arterial lines were removed without difficulty.  He was started on Coumadin for valve anticoagulation.  He was medically stable and transferred for further care in the telemetry unit.  The patient is doing well.  He is ambulating without difficulty.  His INR is sub-therapeutic and he will be discharged on 5mg  Coumadin daily.  He is maintaining NSR.  He is tolerating a regular diet.  He is medically stable at this time and should his INR become therapeutic we will plan for discharge in the next 24-48 hours.  He will follow up with Dr. Laneta Simmers on 03/02/2012 with a chest xray prior to his appointment.  He will also need to follow up at [facility] for a PT/INR  check 48 hours after discharge.  Finally he will need to make a 2 week follow up appointment with his Cardiologist.         Significant Diagnostic Studies:   Intraoperative TEE:   Impression: Pre-bypass findings:  1. Aortic valve: The aortic valve was trileaflet and there was obvious thickening and reduced mobility of the leaflets with restriction of opening. No aortic insufficiency was seen with color doppler. Continuous-wave Doppler interrogation of the left  ventricular outflow tract revealed a peak velocity of 3.6 m/s with a peak instantaneous gradient of 47 mm of mercury and a mean gradient of 27 mm of mercury. The aortic valve area by the continuity equation was 1.19 cm using the VTI method and 1.15 cm using the V-max method. The aortic annulus measured 2.35 cm in diameter.  2. Mitral valve: The mitral leaflets appeared to coapt normally without evidence of any prolapsing or flail segments. There was trace mitral insufficiency. There was moderate mitral annular calcification which was more prominent at the base the posterior leaflet  3. Left Ventricle: There was moderate left ventricular hypertrophy. Left ventricular wall thickness measured 1.2 cm at the posterior wall and 1.3 cm at the anterior wall at end diastole at the mid-papillary level in the transgastric short axis view.  Left ventricular end-diastolic diameter measured 3.0 cm and left ventricular end-systolic diameter measured 1.94 cm at the chordal levcel in the transgastric long axis view. Ejection fraction was estimated 65%. There was normal systolic function of the left ventricle and no thrombus noted in the left ventricular apex. There were no regional wall motion abnormalities.  4. Right ventricle: The right ventricular cavity was of normal size and there was normal contractility the right ventricular free wall.  5. Interatrial septum: The interatrial septum was intact without evidence of patent foramen ovale or atrial septal defect by color Doppler or bubble study.  6. Left atrium: There is no thrombus noted in the left atrium or left atrial appendage.  7. Ascending aorta: The ascending aorta was not dilated and there was no effacement of the aortic root or sinotubular ridge. There was moderate calcification noted in the region of the right sinus of Valsalva. There was mild thickening of the wall of the ascending aorta but no severe atheromatous disease noted.  8. Descending aorta: The  descending aorta showed mild atheromatous disease and measured 2.28 cm in diameter.  Post-bypass findings:  1. 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 Both leaflets opened and closed normally. There were the normal appearing washing jets of aortic insufficiency appreciated but no perivalvular leak appreciated.  2. Mitral valve: The mitral leaflets coapted well and there was trace mitral insufficiency which was unchanged from the pre-bypass study.  3. Left ventricle: There was moderate concentric left ventricular hypertrophy and normal systolic function which was unchanged from the pre-bypass exam.  4. Right ventricle: There was normal appearing right ventricular size and there was normal contractility of the right ventricular free wall. This was unchanged from the pre-bypass exam.  Kipp Brood, M.D.  Addendum:  Error Correction: The following highlighted numbers reflect corrected data from the continuous wave doppler interrogation of the LV outflow. The original numbers were reported incorrectly. The original aortic valve area and annulus diameter measurements are correct.  Impression: Pre-bypass findings:  1. Aortic valve: The aortic valve was trileaflet and there was obvious thickening and reduced mobility of the leaflets with restriction of opening. No aortic insufficiency was seen with  color doppler. Continuous-wave Doppler interrogation of the left ventricular outflow tract revealed a peak velocity of 2.65 m/s with a peak instantaneous gradient of 28 mm of mercury and a mean gradient of 12 mm of mercury. The aortic valve area by the continuity equation was 1.19 cm using the VTI method and 1.15 cm using the V-max method. The aortic annulus measured 2.35 cm in diameter.   Treatments: surgery:   Median sternotomy, extracorporeal circulation,  coronary artery bypass graft surgery x3 using a left internal mammary  artery graft to left anterior descending  coronary artery, with a  saphenous vein graft to the obtuse marginal branch of left circumflex  coronary artery, and a saphenous vein graft to the posterior descending  branch of the right coronary artery. Aortic valve replacement using a  23-mm St. Jude Reg[employer] mechanical valve. Wedge biopsy of right upper  lobe of the lung.  Disposition: Home  The patient has been discharged on:   1.Beta Blocker:  Yes [  x ]                              No   [   ]                              If No, reason:  2.Ace Inhibitor/ARB: Yes [   ]                                     No  [   x ]                                     If No, reason: No MI, preserved EF  3.Statin:   Yes [  x ]                  No  [   ]                  If No, reason:  4.Ecasa:  Yes  [ x  ]                  No   [   ]                  If No, reason:     Discharge Orders    Future Appointments: Provider: Department: Dept Phone: Center:   03/02/2012 2:00 PM Alleen Borne, MD [facility] [phone] [facility]       Medication List     As of 02/10/2012 10:25 AM    STOP taking these medications         amLODipine 5 MG tablet   Commonly known as: NORVASC      TAKE these medications         amitriptyline 25 MG tablet   Commonly known as: ELAVIL   Take 25 mg by mouth at bedtime.      aspirin 81 MG tablet   Take 81 mg by mouth daily.      atorvastatin 10 MG tablet   Commonly known as: LIPITOR   Take 10 mg by mouth daily.      carvedilol 25 MG tablet  Commonly known as: COREG   Take 25 mg by mouth 2 (two) times daily with a meal.      gabapentin 100 MG capsule   Commonly known as: NEURONTIN   Take 100 mg by mouth at bedtime.      glimepiride 4 MG tablet   Commonly known as: AMARYL   Take 4 mg by mouth 2 (two) times daily.      insulin glargine 100 UNIT/ML injection   Commonly known as: LANTUS   Inject 20-25 Units into the skin daily. Take 25 units in the  morning and 20 units in the evening      KOMBIGLYZE XR 2.06-998 MG Tb24   Generic drug: Saxagliptin-Metformin   Take by mouth 2 (two) times daily.      losartan-hydrochlorothiazide 100-25 MG per tablet   Commonly known as: HYZAAR   Take 1 tablet by mouth daily.      NON FORMULARY   CPAP to be used nightly with oxygen @ 1L.      oxyCODONE 5 MG immediate release tablet   Commonly known as: Oxy IR/ROXICODONE   Take 1-2 tablets (5-10 mg total) by mouth every 4 (four) hours as needed.      warfarin 5 MG tablet   Commonly known as: COUMADIN   Take 1 tablet (5 mg total) by mouth daily at 6 PM.         Signed: Adalei Novell 02/10/2012, 10:25 AM

## 2012-02-10 NOTE — Progress Notes (Signed)
4 Days Post-Op Procedure(s) (LRB): CORONARY ARTERY BYPASS GRAFTING (CABG) (N/A) AORTIC VALVE REPLACEMENT (AVR) (N/A) Subjective: No complaints  Objective: Vital signs in last 24 hours: Temp:  [97.7 F (36.5 C)-98.6 F (37 C)] 97.9 F (36.6 C) (12/24 0448) Pulse Rate:  [88-104] 88  (12/24 0448) Cardiac Rhythm:  [-] Sinus tachycardia (12/23 1940) Resp:  [18-20] 18  (12/24 0448) BP: (141-163)/(60-85) 163/78 mmHg (12/24 0448) SpO2:  [92 %-100 %] 92 % (12/24 0757) Weight:  [135.943 kg (299 lb 11.2 oz)] 135.943 kg (299 lb 11.2 oz) (12/24 0448)  Hemodynamic parameters for last 24 hours:    Intake/Output from previous day: 12/23 0701 - 12/24 0700 In: -  Out: 400 [Urine:400] Intake/Output this shift:    General appearance: alert and cooperative Heart: regular rate and rhythm and crisp valve click Lungs: clear to auscultation bilaterally Extremities: edema mild Wound: incisions ok  Lab Results:  Basename 02/09/12 0650 02/08/12 0400  WBC 13.8* 16.7*  HGB 8.9* 8.9*  HCT 26.5* 26.6*  PLT 142* 121*   BMET:  Basename 02/10/12 0525 02/09/12 0650  NA 137 134*  K 3.3* 4.2  CL 100 98  CO2 27 26  GLUCOSE 119* 128*  BUN 24* 18  CREATININE 1.27 1.35  CALCIUM 8.4 8.4    PT/INR:  Basename 02/10/12 0525  LABPROT 15.7*  INR 1.28   ABG    Component Value Date/Time   PHART 7.352 02/06/2012 2224   HCO3 22.2 02/06/2012 2224   TCO2 24 02/07/2012 1737   ACIDBASEDEF 3.0* 02/06/2012 2224   O2SAT 98.0 02/06/2012 2224   CBG (last 3)   Basename 02/10/12 0607 02/09/12 2057 02/09/12 1603  GLUCAP 121* 162* 141*    Assessment/Plan: S/P Procedure(s) (LRB): CORONARY ARTERY BYPASS GRAFTING (CABG) (N/A) AORTIC VALVE REPLACEMENT (AVR) (N/A) Mobilize Diuresis Diabetes control Continue coumadin for mechanical valve. Pathology of lung biopsy reviewed. 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 This will need to be followed up by his pulmonologist, Dr. Milta Deiters. Discussed with  patient. Possibly home in next day or two if therapeutic on coumadin.   LOS: 4 days    Quinzell Malcomb K 02/10/2012

## 2012-02-11 LAB — BASIC METABOLIC PANEL
CO2: 26 mEq/L (ref 19–32)
Chloride: 101 mEq/L (ref 96–112)
Creatinine, Ser: 1.14 mg/dL (ref 0.50–1.35)
Sodium: 139 mEq/L (ref 135–145)

## 2012-02-11 LAB — GLUCOSE, CAPILLARY: Glucose-Capillary: 101 mg/dL — ABNORMAL HIGH (ref 70–99)

## 2012-02-11 LAB — PROTIME-INR
INR: 1.37 (ref 0.00–1.49)
Prothrombin Time: 16.5 seconds — ABNORMAL HIGH (ref 11.6–15.2)

## 2012-02-11 MED ORDER — LOSARTAN POTASSIUM 50 MG PO TABS
100.0000 mg | ORAL_TABLET | Freq: Every day | ORAL | Status: DC
  Administered 2012-02-11 – 2012-02-13 (×3): 100 mg via ORAL
  Filled 2012-02-11 (×3): qty 2

## 2012-02-11 MED ORDER — LACTULOSE 10 GM/15ML PO SOLN: 30.0000 g | Freq: Every day | ORAL | Status: DC | PRN

## 2012-02-11 MED ORDER — LINAGLIPTIN 5 MG PO TABS
5.0000 mg | ORAL_TABLET | Freq: Every day | ORAL | Status: DC
  Administered 2012-02-11 – 2012-02-13 (×3): 5 mg via ORAL
  Filled 2012-02-11 (×3): qty 1

## 2012-02-11 MED ORDER — GLIMEPIRIDE 4 MG PO TABS
4.0000 mg | ORAL_TABLET | Freq: Every day | ORAL | Status: DC
  Administered 2012-02-12: 4 mg via ORAL
  Filled 2012-02-11 (×3): qty 1

## 2012-02-11 MED ORDER — METFORMIN HCL 500 MG PO TABS
1000.0000 mg | ORAL_TABLET | Freq: Two times a day (BID) | ORAL | Status: DC
  Administered 2012-02-11 – 2012-02-12 (×3): 1000 mg via ORAL
  Filled 2012-02-11 (×8): qty 2

## 2012-02-11 NOTE — Progress Notes (Addendum)
5 Days Post-Op Procedure(s) (LRB): CORONARY ARTERY BYPASS GRAFTING (CABG) (N/A) AORTIC VALVE REPLACEMENT (AVR) (N/A) Subjective:  Mr. Kun feels ready to go home today.  He is ambulating without difficulty.  No BM  Objective: Vital signs in last 24 hours: Temp:  [97.7 F (36.5 C)-98.5 F (36.9 C)] 98.5 F (36.9 C) (12/25 0445) Pulse Rate:  [83-92] 87  (12/25 0445) Cardiac Rhythm:  [-] Normal sinus rhythm (12/24 2000) Resp:  [18] 18  (12/25 0445) BP: (128-155)/(52-69) 142/66 mmHg (12/25 0445) SpO2:  [92 %-97 %] 97 % (12/25 0445) Weight:  [290 lb (131.543 kg)] 290 lb (131.543 kg) (12/25 0445)  Intake/Output from previous day:  General appearance: alert, cooperative and no distress Heart: regular rate and rhythm Lungs: clear to auscultation bilaterally Abdomen: soft, non-tender; bowel sounds normal; no masses,  no organomegaly Extremities: edema 1+ Wound: clean and dry  Lab Results:  [facility] 02/09/12 0650  WBC 13.8*  HGB 8.9*  HCT 26.5*  PLT 142*   BMET:  Basename 02/11/12 0545 02/10/12 0525  NA 139 137  K 4.1 3.3*  CL 101 100  CO2 26 27  GLUCOSE 121* 119*  BUN 29* 24*  CREATININE 1.14 1.27  CALCIUM 9.1 8.4    PT/INR:  Basename 02/11/12 0545  LABPROT 16.5*  INR 1.37   ABG    Component Value Date/Time   PHART 7.352 02/06/2012 2224   HCO3 22.2 02/06/2012 2224   TCO2 24 02/07/2012 1737   ACIDBASEDEF 3.0* 02/06/2012 2224   O2SAT 98.0 02/06/2012 2224   CBG (last 3)   Basename 02/11/12 0608 02/10/12 2140 02/10/12 1614  GLUCAP 116* 138* 151*    Assessment/Plan: S/P Procedure(s) (LRB): CORONARY ARTERY BYPASS GRAFTING (CABG) (N/A) AORTIC VALVE REPLACEMENT (AVR) (N/A)  1. CV- NSR- continue Coreg, Cozaar 2. INR 1.37- continue 5mg  daily 3. Pulm- off oxygen, no acute issues, continue IS 4. DM- CBGs controlled, will restart home Metformin, Saxagliptin, Amaryl 5. LOC Constipation- lactulose prn 6. Dispo- patient doing well, INR is not therapeutic will  continue current dose, hopefully d/c home in 24-48 hours   LOS: 5 days    BARRETT, ERIN 02/11/2012   patient examined and medical record reviewed,agree with above note. VAN TRIGT III,Naylea Wigington 02/11/2012

## 2012-02-11 NOTE — Progress Notes (Signed)
Pt has ambulated 2 times in hallway today 350 ft each time, states he is really worn out after  2nd walk .  Resting in bed at this time, denies pain sob, will follow closely Georgette Dover

## 2012-02-12 LAB — GLUCOSE, CAPILLARY: Glucose-Capillary: 82 mg/dL (ref 70–99)

## 2012-02-12 LAB — PROTIME-INR
INR: 1.68 — ABNORMAL HIGH (ref 0.00–1.49)
Prothrombin Time: 19.2 seconds — ABNORMAL HIGH (ref 11.6–15.2)

## 2012-02-12 NOTE — Progress Notes (Signed)
CARDIAC REHAB PHASE I   PRE:  Rate/Rhythm: 86 SR  BP:  Supine: 134/56  Sitting:   Standing:    SaO2: 93 RA  MODE:  Ambulation: 340 ft   POST:  Rate/Rhythem: 101  BP:  Supine:   Sitting: 168/60  Standing:    SaO2: 89 RA 0940-1005 Assisted X 1 and used walker to ambulate. Gait steady with walker. Pt tires easily and took several standing rest stops during 340 feet walk. He states that he felt that walker made it easier for him to walk. Ask pt if he wanted walker for home use but he declines. He states that he believes that he will be all right at home without it.  Beatrix Fetters

## 2012-02-12 NOTE — Progress Notes (Signed)
6 Days Post-Op Procedure(s) (LRB): CORONARY ARTERY BYPASS GRAFTING (CABG) (N/A) AORTIC VALVE REPLACEMENT (AVR) (N/A) Subjective: No complaints  Objective: Vital signs in last 24 hours: Temp:  [98 F (36.7 C)-98.6 F (37 C)] 98.6 F (37 C) (12/26 0520) Pulse Rate:  [82-95] 85  (12/26 0520) Cardiac Rhythm:  [-] Normal sinus rhythm;Heart block (12/25 1935) Resp:  [18] 18  (12/26 0520) BP: (123-158)/(70-78) 158/78 mmHg (12/26 0520) SpO2:  [95 %-98 %] 95 % (12/26 0520) Weight:  [129.956 kg (286 lb 8 oz)] 129.956 kg (286 lb 8 oz) (12/26 0520)  Hemodynamic parameters for last 24 hours:    Intake/Output from previous day: 12/25 0701 - 12/26 0700 In: 480 [P.O.:480] Out: 251 [Urine:250; Stool:1] Intake/Output this shift:    General appearance: alert and cooperative Neurologic: intact Heart: regular rate and rhythm and crisp valve click Lungs: clear to auscultation bilaterally Extremities: edema mild bilateral leg edea Wound: incisions ok  Lab Results: No results found for this basename: WBC:2,HGB:2,HCT:2,PLT:2 in the last 72 hours BMET:  [facility] 02/11/12 0545 02/10/12 0525  NA 139 137  K 4.1 3.3*  CL 101 100  CO2 26 27  GLUCOSE 121* 119*  BUN 29* 24*  CREATININE 1.14 1.27  CALCIUM 9.1 8.4    PT/INR:  Basename 02/12/12 0555  LABPROT 19.2*  INR 1.68*   ABG    Component Value Date/Time   PHART 7.352 02/06/2012 2224   HCO3 22.2 02/06/2012 2224   TCO2 24 02/07/2012 1737   ACIDBASEDEF 3.0* 02/06/2012 2224   O2SAT 98.0 02/06/2012 2224   CBG (last 3)   Basename 02/12/12 0618 02/11/12 2055 02/11/12 1611  GLUCAP 89 129* 101*    Assessment/Plan: S/P Procedure(s) (LRB): CORONARY ARTERY BYPASS GRAFTING (CABG) (N/A) AORTIC VALVE REPLACEMENT (AVR) (N/A) HTN: relatively well controlled. He is still getting diuresed so I would not increase meds at this time. He can resume previous regimen for HTN at home. Mobilize Diuresis Diabetes control: Plan home on previous  regimen. continue coumadin 5 mg per day. Plan home in am if INR continues to rise. Goal is 2.5-3.0.   LOS: 6 days    Refugio Vandevoorde K 02/12/2012

## 2012-02-12 NOTE — Progress Notes (Signed)
CBG 69, pt asymptomatic dinner served, pt ate 80 %, cbg recheck 82. Will continue to follow.

## 2012-02-13 LAB — PROTIME-INR
INR: 1.87 — ABNORMAL HIGH (ref 0.00–1.49)
Prothrombin Time: 20.8 seconds — ABNORMAL HIGH (ref 11.6–15.2)

## 2012-02-13 NOTE — Progress Notes (Signed)
7 Days Post-Op Procedure(s) (LRB): CORONARY ARTERY BYPASS GRAFTING (CABG) (N/A) AORTIC VALVE REPLACEMENT (AVR) (N/A) Subjective:  Mr. Kavanagh has no complaints today.  He is ready to go home.  He is ambulating without difficulty +BM  Objective: Vital signs in last 24 hours: Temp:  [98.2 F (36.8 C)-98.9 F (37.2 C)] 98.2 F (36.8 C) (12/27 0603) Pulse Rate:  [78-93] 91  (12/27 0603) Cardiac Rhythm:  [-] Normal sinus rhythm;Sinus tachycardia (12/26 1935) Resp:  [17-18] 18  (12/27 0603) BP: (124-154)/(68-78) 153/71 mmHg (12/27 0603) SpO2:  [96 %-97 %] 97 % (12/27 0603) Weight:  [284 lb 2.8 oz (128.9 kg)] 284 lb 2.8 oz (128.9 kg) (12/27 0603)    Intake/Output from previous day: 12/26 0701 - 12/27 0700 In: 840 [P.O.:840] Out: -     General appearance: alert, cooperative and no distress Neurologic: intact Heart: regular rate and rhythm Lungs: clear to auscultation bilaterally Abdomen: soft, non-tender; bowel sounds normal; no masses,  no organomegaly Extremities: edema 1-2+ Wound: clean and dry  Lab Results: No results found for this basename: WBC:2,HGB:2,HCT:2,PLT:2 in the last 72 hours BMET:  [facility] 02/11/12 0545  NA 139  K 4.1  CL 101  CO2 26  GLUCOSE 121*  BUN 29*  CREATININE 1.14  CALCIUM 9.1    PT/INR:  Basename 02/13/12 0546  LABPROT 20.8*  INR 1.87*   ABG    Component Value Date/Time   PHART 7.352 02/06/2012 2224   HCO3 22.2 02/06/2012 2224   TCO2 24 02/07/2012 1737   ACIDBASEDEF 3.0* 02/06/2012 2224   O2SAT 98.0 02/06/2012 2224   CBG (last 3)   Basename 02/13/12 0605 02/12/12 2040 02/12/12 1654  GLUCAP 94 166* 82    Assessment/Plan: S/P Procedure(s) (LRB): CORONARY ARTERY BYPASS GRAFTING (CABG) (N/A) AORTIC VALVE REPLACEMENT (AVR) (N/A)  1. CV- NSR on Coreg and Cozaar 2. INR 1.87, continue Coumadin 5mg  daily 3. Pulm- off oxygen, no acute issues Cont IS 4. DM- CBGs controlled, continue home meds 5. LOC- constipation resolved 6. Dispo-  INR continues to rise, instructed patient to have INR drawn first thing Monday morning, will d/c home today  LOS: 7 days    Raford Pitcher, Denny Peon 02/13/2012

## 2012-02-13 NOTE — Progress Notes (Addendum)
1 CARDIAC REHAB PHASE I   PRE:  Rate/Rhythm: 84  BP:  Supine:   Sitting: 120./60  Standing:    SaO2: 94 RA  MODE:  Ambulation: 500 ft   POST:  Rate/Rhythem: 92  BP:  Supine:   Sitting: 160/60  Standing:    SaO2: 93 RA 1015-1035 Assisted X 1 and  used walker to ambulate. Gait steady with walker. Pt tires and c/o of DOE. He took several standing rest stops due to this. Able to get pt 500 feet today. BP after walk 160/60. Pt back to recliner after walk. Reviewed discharge education with pt. He voices understanding.He continues to decline walker for home use.Adam Ibarra

## 2012-02-13 NOTE — Progress Notes (Signed)
7 Days Post-Op Procedure(s) (LRB): CORONARY ARTERY BYPASS GRAFTING (CABG) (N/A) AORTIC VALVE REPLACEMENT (AVR) (N/A) Subjective: No complaints. Feels well  Objective: Vital signs in last 24 hours: Temp:  [98.2 F (36.8 C)-98.9 F (37.2 C)] 98.2 F (36.8 C) (12/27 0603) Pulse Rate:  [78-93] 91  (12/27 0603) Cardiac Rhythm:  [-] Normal sinus rhythm;Sinus tachycardia (12/26 1935) Resp:  [17-18] 18  (12/27 0603) BP: (124-154)/(68-78) 153/71 mmHg (12/27 0603) SpO2:  [96 %-98 %] 98 % (12/27 0746) Weight:  [128.9 kg (284 lb 2.8 oz)] 128.9 kg (284 lb 2.8 oz) (12/27 0603)  Hemodynamic parameters for last 24 hours:    Intake/Output from previous day: 12/26 0701 - 12/27 0700 In: 840 [P.O.:840] Out: -  Intake/Output this shift:    General appearance: alert and cooperative Neurologic: intact Heart: regular rate and rhythm Lungs: clear to auscultation bilaterally Extremities: edema mild Wound: incisions ok  Lab Results: No results found for this basename: WBC:2,HGB:2,HCT:2,PLT:2 in the last 72 hours BMET:  [facility] 02/11/12 0545  NA 139  K 4.1  CL 101  CO2 26  GLUCOSE 121*  BUN 29*  CREATININE 1.14  CALCIUM 9.1    PT/INR:  Basename 02/13/12 0546  LABPROT 20.8*  INR 1.87*   ABG    Component Value Date/Time   PHART 7.352 02/06/2012 2224   HCO3 22.2 02/06/2012 2224   TCO2 24 02/07/2012 1737   ACIDBASEDEF 3.0* 02/06/2012 2224   O2SAT 98.0 02/06/2012 2224   CBG (last 3)   Basename 02/13/12 0605 02/12/12 2040 02/12/12 1654  GLUCAP 94 166* 82    Assessment/Plan: S/P Procedure(s) (LRB): CORONARY ARTERY BYPASS GRAFTING (CABG) (N/A) AORTIC VALVE REPLACEMENT (AVR) (N/A) He is doing well. Plan for discharge: see discharge orders Resume Hyzaar at home so he will not need Lasix Continue coumadin 5 mg per day. He will need INR drawn by [facility] on Monday with results to Dr. Kennith Maes in [place]. Patient's wife does not drive and he will have no way to get to office to have  it checked.   LOS: 7 days    Cora Brierley K 02/13/2012

## 2012-02-13 NOTE — Progress Notes (Signed)
CT sutures removed per order and unit protocol, Pt tolerated very well.  Sites painted, steri's applied.  Will con't plan of care, planning for DC later this morning pending home health arrangement.

## 2012-02-16 ENCOUNTER — Encounter (HOSPITAL_COMMUNITY): Payer: Self-pay

## 2012-02-16 ENCOUNTER — Emergency Department (HOSPITAL_COMMUNITY): Payer: 59

## 2012-02-16 ENCOUNTER — Telehealth: Payer: Self-pay | Admitting: Cardiovascular Disease

## 2012-02-16 ENCOUNTER — Observation Stay (HOSPITAL_COMMUNITY)
Admission: EM | Admit: 2012-02-16 | Discharge: 2012-02-17 | Disposition: A | Discharge status: Home / Self Care | Payer: 59 | Attending: Cardiovascular Disease | Admitting: Cardiovascular Disease

## 2012-02-16 DIAGNOSIS — Z7901 Long term (current) use of anticoagulants: Secondary | ICD-10-CM | POA: Insufficient documentation

## 2012-02-16 DIAGNOSIS — Z951 Presence of aortocoronary bypass graft: Secondary | ICD-10-CM | POA: Insufficient documentation

## 2012-02-16 DIAGNOSIS — R55 Syncope and collapse: Principal | ICD-10-CM | POA: Insufficient documentation

## 2012-02-16 DIAGNOSIS — Z79899 Other long term (current) drug therapy: Secondary | ICD-10-CM | POA: Insufficient documentation

## 2012-02-16 DIAGNOSIS — Z952 Presence of prosthetic heart valve: Secondary | ICD-10-CM

## 2012-02-16 DIAGNOSIS — I10 Essential (primary) hypertension: Secondary | ICD-10-CM | POA: Insufficient documentation

## 2012-02-16 DIAGNOSIS — E669 Obesity, unspecified: Secondary | ICD-10-CM | POA: Insufficient documentation

## 2012-02-16 DIAGNOSIS — I350 Nonrheumatic aortic (valve) stenosis: Secondary | ICD-10-CM

## 2012-02-16 DIAGNOSIS — Z954 Presence of other heart-valve replacement: Secondary | ICD-10-CM | POA: Insufficient documentation

## 2012-02-16 DIAGNOSIS — E785 Hyperlipidemia, unspecified: Secondary | ICD-10-CM | POA: Insufficient documentation

## 2012-02-16 DIAGNOSIS — I359 Nonrheumatic aortic valve disorder, unspecified: Secondary | ICD-10-CM | POA: Insufficient documentation

## 2012-02-16 DIAGNOSIS — D649 Anemia, unspecified: Secondary | ICD-10-CM

## 2012-02-16 DIAGNOSIS — E119 Type 2 diabetes mellitus without complications: Secondary | ICD-10-CM | POA: Insufficient documentation

## 2012-02-16 DIAGNOSIS — R630 Anorexia: Secondary | ICD-10-CM | POA: Insufficient documentation

## 2012-02-16 DIAGNOSIS — G4730 Sleep apnea, unspecified: Secondary | ICD-10-CM | POA: Insufficient documentation

## 2012-02-16 DIAGNOSIS — I2510 Atherosclerotic heart disease of native coronary artery without angina pectoris: Secondary | ICD-10-CM | POA: Insufficient documentation

## 2012-02-16 DIAGNOSIS — Z794 Long term (current) use of insulin: Secondary | ICD-10-CM | POA: Insufficient documentation

## 2012-02-16 HISTORY — DX: Syncope and collapse: R55

## 2012-02-16 LAB — PROTIME-INR: INR: 2.95 — ABNORMAL HIGH (ref 0.00–1.49)

## 2012-02-16 LAB — CBC WITH DIFFERENTIAL/PLATELET
Basophils Absolute: 0.1 10*3/uL (ref 0.0–0.1)
Basophils Relative: 1 % (ref 0–1)
Eosinophils Absolute: 0.4 10*3/uL (ref 0.0–0.7)
Hemoglobin: 8.4 g/dL — ABNORMAL LOW (ref 13.0–17.0)
MCH: 27.8 pg (ref 26.0–34.0)
MCHC: 31.8 g/dL (ref 30.0–36.0)
Monocytes Absolute: 0.6 10*3/uL (ref 0.1–1.0)
Monocytes Relative: 8 % (ref 3–12)
Neutrophils Relative %: 74 % (ref 43–77)
RDW: 14.5 % (ref 11.5–15.5)

## 2012-02-16 LAB — TROPONIN I: Troponin I: <0.3 ng/mL (ref <0.30)

## 2012-02-16 LAB — PRO B NATRIURETIC PEPTIDE: Pro B Natriuretic peptide (BNP): 820.9 pg/mL — ABNORMAL HIGH (ref 0–125)

## 2012-02-16 LAB — COMPREHENSIVE METABOLIC PANEL
AST: 33 U/L (ref 0–37)
Albumin: 3 g/dL — ABNORMAL LOW (ref 3.5–5.2)
BUN: 18 mg/dL (ref 6–23)
Creatinine, Ser: 1.25 mg/dL (ref 0.50–1.35)
Potassium: 4.7 mEq/L (ref 3.5–5.1)
Total Protein: 7.2 g/dL (ref 6.0–8.3)

## 2012-02-16 MED ORDER — INSULIN GLARGINE 100 UNIT/ML ~~LOC~~ SOLN
20.0000 [IU] | Freq: Every day | SUBCUTANEOUS | Status: DC
  Administered 2012-02-16: 20 [IU] via SUBCUTANEOUS

## 2012-02-16 MED ORDER — INSULIN ASPART 100 UNIT/ML ~~LOC~~ SOLN
0.0000 [IU] | Freq: Three times a day (TID) | SUBCUTANEOUS | Status: DC
  Administered 2012-02-17: 1 [IU] via SUBCUTANEOUS

## 2012-02-16 MED ORDER — GLIMEPIRIDE 4 MG PO TABS
4.0000 mg | ORAL_TABLET | Freq: Two times a day (BID) | ORAL | Status: DC
  Administered 2012-02-17: 4 mg via ORAL
  Filled 2012-02-16 (×3): qty 1

## 2012-02-16 MED ORDER — CARVEDILOL 25 MG PO TABS
25.0000 mg | ORAL_TABLET | Freq: Two times a day (BID) | ORAL | Status: DC
  Administered 2012-02-16 – 2012-02-17 (×2): 25 mg via ORAL
  Filled 2012-02-16 (×4): qty 1

## 2012-02-16 MED ORDER — WARFARIN - PHYSICIAN DOSING INPATIENT: Freq: Every day | Status: DC

## 2012-02-16 MED ORDER — SAXAGLIPTIN-METFORMIN ER 2.5-1000 MG PO TB24: 1.0000 | ORAL_TABLET | Freq: Two times a day (BID) | ORAL | Status: DC

## 2012-02-16 MED ORDER — GABAPENTIN 100 MG PO CAPS
100.0000 mg | ORAL_CAPSULE | Freq: Every day | ORAL | Status: DC
  Administered 2012-02-16: 100 mg via ORAL
  Filled 2012-02-16 (×2): qty 1

## 2012-02-16 MED ORDER — LOSARTAN POTASSIUM 50 MG PO TABS
50.0000 mg | ORAL_TABLET | Freq: Every day | ORAL | Status: DC
  Administered 2012-02-17: 50 mg via ORAL
  Filled 2012-02-16: qty 1

## 2012-02-16 MED ORDER — INSULIN GLARGINE 100 UNIT/ML ~~LOC~~ SOLN
25.0000 [IU] | Freq: Every day | SUBCUTANEOUS | Status: DC
  Administered 2012-02-17: 25 [IU] via SUBCUTANEOUS

## 2012-02-16 MED ORDER — SODIUM CHLORIDE 0.9 % IV SOLN: 250.0000 mL | INTRAVENOUS | Status: DC | PRN

## 2012-02-16 MED ORDER — SODIUM CHLORIDE 0.9 % IJ SOLN: 3.0000 mL | INTRAMUSCULAR | Status: DC | PRN

## 2012-02-16 MED ORDER — WARFARIN SODIUM 5 MG PO TABS
5.0000 mg | ORAL_TABLET | Freq: Every day | ORAL | Status: DC
  Administered 2012-02-16: 5 mg via ORAL
  Filled 2012-02-16 (×2): qty 1

## 2012-02-16 MED ORDER — METFORMIN HCL ER 500 MG PO TB24
1000.0000 mg | ORAL_TABLET | Freq: Two times a day (BID) | ORAL | Status: DC
  Administered 2012-02-17: 1000 mg via ORAL
  Filled 2012-02-16 (×3): qty 2

## 2012-02-16 MED ORDER — AMITRIPTYLINE HCL 25 MG PO TABS
25.0000 mg | ORAL_TABLET | Freq: Every day | ORAL | Status: DC
Start: 2012-02-16 — End: 2012-02-17
  Administered 2012-02-16: 25 mg via ORAL
  Filled 2012-02-16 (×2): qty 1

## 2012-02-16 MED ORDER — LINAGLIPTIN 5 MG PO TABS
5.0000 mg | ORAL_TABLET | Freq: Every day | ORAL | Status: DC
  Administered 2012-02-17: 5 mg via ORAL
  Filled 2012-02-16: qty 1

## 2012-02-16 MED ORDER — ASPIRIN EC 81 MG PO TBEC
81.0000 mg | DELAYED_RELEASE_TABLET | Freq: Every day | ORAL | Status: DC
  Administered 2012-02-16 – 2012-02-17 (×2): 81 mg via ORAL
  Filled 2012-02-16 (×2): qty 1

## 2012-02-16 MED ORDER — SODIUM CHLORIDE 0.9 % IJ SOLN
3.0000 mL | Freq: Two times a day (BID) | INTRAMUSCULAR | Status: DC
  Administered 2012-02-16: 3 mL via INTRAVENOUS

## 2012-02-16 MED ORDER — ATORVASTATIN CALCIUM 10 MG PO TABS
10.0000 mg | ORAL_TABLET | Freq: Every day | ORAL | Status: DC
  Administered 2012-02-16: 10 mg via ORAL
  Filled 2012-02-16 (×2): qty 1

## 2012-02-16 NOTE — Telephone Encounter (Signed)
I called Consuella Lose back. She explains she went to see pt today for first home visit post op. Says pt came to door, was very dyspneic, went to sit at table and placed head on arms on table and became unresponsive. Says pt had a pulse and BP was elevated. He became incont. Of urine.  She immediately called 911. When EMS arrived, pt was responsive and oriented to time and place.  Pt went via ambulance to [facility]. I told her I would make Dr. Kirke Corin aware.

## 2012-02-16 NOTE — ED Notes (Signed)
States had an episode lasting two minutes of unresponsiveness at home, was incontinent of urine at same time.

## 2012-02-16 NOTE — H&P (Addendum)
ADMISSION HISTORY AND PHYSICAL   Date: 02/16/2012               Patient Name:  Adam Ibarra MRN: [id number]  DOB: Apr 18, 1950 Age / Sex: 61 y.o., male        PCP: Lyndon Code Primary Cardiologist: Kirke Corin         History of Present Illness: Patient is a 61 y.o. male with a PMHx of CAD. AS - s/p - AVR and CABG, who was admitted to [facility] on 02/16/2012 for evaluation of an episode of syncope.   He was DC'd on 12/24 from [facility] after CABG and AVR.  This AM, he was fixing breakfast, watching TV.  He answered the door ( visiting nurse), became dyspneic ( felt he wasn't getting enough air) light headed, .  The home health nurse said he was "out " for about 2 minutes. He was gasping for air.   He was Incontinent of urine.  EMS was called and he was brought to [facility] ER.  He feels fine now.  He denies any dizziness. He denies any chest pain or shortness breath.   Medications: Outpatient medications: No current facility-administered medications for this encounter.   Current Outpatient Prescriptions  Medication Sig Dispense Refill  . amitriptyline (ELAVIL) 25 MG tablet Take 25 mg by mouth at bedtime.       Marland Kitchen aspirin EC 81 MG tablet Take 81 mg by mouth daily.      Marland Kitchen atorvastatin (LIPITOR) 10 MG tablet Take 10 mg by mouth at bedtime.       . carvedilol (COREG) 25 MG tablet Take 25 mg by mouth 2 (two) times daily with a meal.      . gabapentin (NEURONTIN) 100 MG capsule Take 100 mg by mouth at bedtime.      Marland Kitchen glimepiride (AMARYL) 4 MG tablet Take 4 mg by mouth 2 (two) times daily with a meal.       . insulin glargine (LANTUS) 100 UNIT/ML injection Inject 20-25 Units into the skin daily. Take 25 units in the morning and 20 units in the evening      . losartan-hydrochlorothiazide (HYZAAR) 100-25 MG per tablet Take 1 tablet by mouth daily.      . Saxagliptin-Metformin (KOMBIGLYZE XR) 2.06-998 MG TB24 Take 1 tablet by mouth 2 (two) times daily with a meal.       . warfarin (COUMADIN) 5 MG tablet Take 1  tablet (5 mg total) by mouth daily at 6 PM.  30 tablet  1  . NON FORMULARY CPAP to be used nightly with oxygen @ 1L.         Allergies  Allergen Reactions  . Ivp Dye (Iodinated Diagnostic Agents) Itching    Can tolerate if takes benadryl     Past Medical History  Diagnosis Date  . Arthritis   . Hyperlipidemia   . Nephrolithiasis   . Heart murmur   . Diabetes mellitus without complication     Type II  . Aortic stenosis     Mild by echo  . Obesity   . Coronary artery disease   . Sleep apnea     cpap    last study   2 yrs ago  . Hypertension     dr Kirke Corin    Past Surgical History  Procedure Date  . Vasectomy   . Vasectomy reversal   . Appendectomy   . Cataract extraction, bilateral   . Coronary artery bypass graft 02/06/2012  Procedure: CORONARY ARTERY BYPASS GRAFTING (CABG);  Surgeon: Alleen Borne, MD;  Location: [facility] OR;  Service: Open Heart Surgery;  Laterality: N/A;  CABG x three,  using left internal mammary artery and right leg greater saphenous vein harvested endoscopically  . Aortic valve replacement 02/06/2012    Procedure: AORTIC VALVE REPLACEMENT (AVR);  Surgeon: Alleen Borne, MD;  Location: [facility] OR;  Service: Open Heart Surgery;  Laterality: N/A;    Family History  Problem Relation Age of Onset  . Heart disease Mother   . Heart disease Brother     Social History:  reports that he quit smoking about 34 years ago. His smoking use included Cigarettes. He has a 27 pack-year smoking history. He has never used smokeless tobacco. He reports that he drinks alcohol. He reports that he does not use illicit drugs.   Review of Systems: Constitutional:  denies fever, chills, diaphoresis, appetite change and fatigue.  He had mild diaphoresis after the episode.   HEENT: denies photophobia, eye pain, redness, hearing loss, ear pain, congestion, sore throat, rhinorrhea, sneezing, neck pain, neck stiffness and tinnitus.  Respiratory: admits to a slight cough,     Cardiovascular: denies chest pain, palpitations and leg swelling.  Gastrointestinal: denies nausea, vomiting, abdominal pain, diarrhea, constipation, blood in stool.  Genitourinary: denies dysuria, urgency, frequency, hematuria, flank pain and difficulty urinating.  Musculoskeletal: denies  myalgias, back pain, joint swelling, arthralgias and gait problem.   Skin: denies pallor, rash and wound.  He  Sternum is healing well  Neurological: admits to  A syncopal today but otherwise no  weakness, light-headedness, numbness and headaches.   Hematological: denies adenopathy, easy bruising, personal or family bleeding history.  Psychiatric/ Behavioral: denies suicidal ideation, mood changes, confusion, nervousness, sleep disturbance and agitation.    Physical Exam: BP 139/45  Pulse 78  Temp 98.1 F (36.7 C) (Oral)  Resp 24  SpO2 100%  General: Vital signs reviewed and noted. Well-developed, well-nourished, in no acute distress; alert, appropriate and cooperative throughout examination.  Head: Normocephalic, atraumatic, sclera anicteric, mucus membranes are moist  Neck: Supple. Negative for carotid bruits. JVD not elevated.  Lungs:  Few rales in bases, right > left   Heart: RRR with S1, mechanical, crisp S2.   Abdomen:  Soft, non-tender, non-distended with normoactive bowel sounds. No hepatomegaly. No rebound/guarding. No obvious abdominal masses  MSK: Strength and the appear normal for age.  Extremities: No clubbing or cyanosis. No edema.  Distal pedal pulses are 2+ and equal bilaterally.  Neurologic: Alert and oriented X 3. Moves all extremities spontaneously  Psych:  Responds to questions appropriately with a normal affect.    Lab results: Basic Metabolic Panel:  Lab 02/11/12 1610 02/10/12 0525  NA 139 137  K 4.1 3.3*  CL 101 100  CO2 26 27  GLUCOSE 121* 119*  BUN 29* 24*  CREATININE 1.14 1.27  CALCIUM 9.1 8.4  MG -- --  PHOS -- --    Liver Function Tests: No results found  for this basename: AST:3,ALT:3,ALKPHOS:3,BILITOT:3,PROT:3,ALBUMIN:3 in the last 168 hours No results found for this basename: LIPASE:3,AMYLASE:3 in the last 168 hours  CBC:  Lab 02/16/12 1416  WBC 8.2  NEUTROABS 6.0  HGB 8.4*  HCT 26.4*  MCV 87.4  PLT 337    Cardiac Enzymes: No results found for this basename: CKTOTAL:5,CKMB:5,CKMBINDEX:5,TROPONINI:5 in the last 168 hours  BNP: No components found with this basename: POCBNP:3  CBG:  Lab 02/13/12 0605 02/12/12 2040 02/12/12 1654 02/12/12 1623  02/12/12 1109  GLUCAP 94 166* 82 69* 96    Coagulation Studies:  Basename 02/16/12 1416  LABPROT 29.2*  INR 2.95*      ECG: NSR   Imaging: Dg Chest 2 View  02/16/2012  *RADIOLOGY REPORT*  Clinical Data: Syncope, shortness of breath  CHEST - 2 VIEW  Comparison: 02/09/2012  Findings: Coronary bypass changes and aortic valve replacement noted.  Stable mild cardiomegaly with chronic interstitial parenchymal opacities and scarring bilaterally.  Low lung volumes persist.  No definite superimposed edema, pneumonia, collapse, consolidation, effusion, or pneumothorax.  Trachea is midline. Degenerative changes of the spine.  IMPRESSION: Stable postoperative and chronic findings.  No superimposed acute process   Original Report Authenticated By: Judie Petit. Arey Costain, M.D.    Ct Head Wo Contrast  02/16/2012  *RADIOLOGY REPORT*  Clinical Data: Loss of consciousness.  Syncope.  CT HEAD WITHOUT CONTRAST  Technique:  Contiguous axial images were obtained from the base of the skull through the vertex without contrast.  Comparison: None.  Findings: The brain shows mild generalized atrophy.  There is no evidence of old or acute focal infarction, mass lesion, hemorrhage, hydrocephalus or extra-axial collection.  There is atherosclerotic calcification of the major vessels at the base of the brain.  The calvarium is unremarkable.  Sinuses, middle ears and mastoids are clear.  IMPRESSION: No acute or focal finding.   Mild atrophy.  Calcific atherosclerosis of the major vessels at the base of the brain.   Original Report Authenticated By: Paulina Fusi, M.D.          Assessment & Plan:  1. Syncope:  Ms. Anner Crete presents today with an episode of syncope. Has history of coronary artery bypass grafting and aortic valve replacement several weeks ago. He now presents with an episode of syncope. I suspect that this episode was due to orthostatic hypotension. He had just gotten up from the chair and went to the door. The home health nurse was at the door and didn't later witnessed him to have an episode of syncope. He had some incontinence of urine. She states that he was out for 2 minutes or so. When he came to the is completely normal. There was no residual shortness of breath, chest pain, dizziness, or weakness.  The patient feels well and really did not want to stay in the hospital. I think would be best to observe him overnight. We want to make sure that he doesn't have any significant arrhythmias. I would like to decrease his Losartan  to one half its current dose.  Will hold the HCTZ for now - I would expect to be able to restart it a lower dose tomorrow.  This should minimize his episodes of orthostatic hypotension.  2. Aortic valve replacement: his valve sounds are crisp.  His INR is therapeutic.  3. Coronary artery disease: Continue with current medications.   DVT PPX - on coumadin    Alvia Grove., MD, [facility] 02/16/2012, 3:42 PM

## 2012-02-16 NOTE — Telephone Encounter (Signed)
Pt was sent to [facility] ED. Pt was unresponsive for 2 mins. Consuella Lose from [facility] [phone]

## 2012-02-16 NOTE — Progress Notes (Signed)
PHARMACIST - PHYSICIAN COMMUNICATION DR:  Elease Hashimoto CONCERNING: Pharmacy Care Issues Regarding Warfarin Labs  RECOMMENDATION (Action Taken): A baseline and daily protime for three days has been ordered to meet the [facility] National Patient safety goal and comply with the current [facility] Pharmacy & Therapeutics Committee policy.   The Pharmacy will defer all warfarin dose order changes and follow up of lab results to the prescriber unless an additional order to initiate a "pharmacy Coumadin consult" is placed.  DESCRIPTION:  While hospitalized, to be in compliance with The [employer] Commission National Patient Safety Goals, all patients on warfarin must have a baseline and/or current protime prior to the administration of warfarin. Pharmacy has received your order for warfarin without these required laboratory assessments.  Noted that patient is likely to be here for 24 observation, pharmacy will continue to follow peripherally.  Thank you, Sheppard Coil PharmD

## 2012-02-16 NOTE — ED Provider Notes (Signed)
History     CSN: [id number]  Arrival date & time 02/16/12  1248   First MD Initiated Contact with Patient 02/16/12 1300      Chief Complaint  Patient presents with  . Loss of Consciousness    (Consider location/radiation/quality/duration/timing/severity/associated sxs/prior treatment) HPI Comments: Patient arrives via EMS after syncopal episode at home. Patient states he got from his chair and was walking to the kitchen when he began to feel weak but denies any dizziness or lightheadedness. He states he went to lay down and the dining room table and his nurse told him he passed out for 2 minutes. He now feels back to normal. No chest pain or shortness of breath. He had a triple bypass and aortic valve replacement done on December 20 and went home onDecember 24. He denies any nausea, vomiting, abdominal pain or fever. He states his wounds are healing well. He states he did not hit his head. He did have urinary incontinence. No reported tongue biting or seizure activity.  Patient is a 61 y.o. male presenting with syncope. The history is provided by the patient.  Loss of Consciousness Pertinent negatives include no chest pain, no abdominal pain, no headaches and no shortness of breath.    Past Medical History  Diagnosis Date  . Arthritis   . Hyperlipidemia   . Nephrolithiasis   . Heart murmur   . Diabetes mellitus without complication     Type II  . Aortic stenosis     Mild by echo  . Obesity   . Coronary artery disease   . Sleep apnea     cpap    last study   2 yrs ago  . Hypertension     dr Kirke Corin    Past Surgical History  Procedure Date  . Vasectomy   . Vasectomy reversal   . Appendectomy   . Cataract extraction, bilateral   . Coronary artery bypass graft 02/06/2012    Procedure: CORONARY ARTERY BYPASS GRAFTING (CABG);  Surgeon: Alleen Borne, MD;  Location: [facility] OR;  Service: Open Heart Surgery;  Laterality: N/A;  CABG x three,  using left internal mammary artery and  right leg greater saphenous vein harvested endoscopically  . Aortic valve replacement 02/06/2012    Procedure: AORTIC VALVE REPLACEMENT (AVR);  Surgeon: Alleen Borne, MD;  Location: [facility] OR;  Service: Open Heart Surgery;  Laterality: N/A;    Family History  Problem Relation Age of Onset  . Heart disease Mother   . Heart disease Brother     History  Substance Use Topics  . Smoking status: Former Smoker -- 3.0 packs/day for 9 years    Types: Cigarettes    Quit date: 02/02/1978  . Smokeless tobacco: Never Used  . Alcohol Use: Yes     Comment: SOCIAL      Review of Systems  Constitutional: Positive for activity change and appetite change. Negative for fever.  HENT: Negative for congestion and rhinorrhea.   Respiratory: Negative for cough, chest tightness and shortness of breath.   Cardiovascular: Positive for syncope. Negative for chest pain.  Gastrointestinal: Negative for nausea, vomiting and abdominal pain.  Musculoskeletal: Negative for back pain.  Skin: Negative for rash.  Neurological: Positive for syncope and weakness. Negative for dizziness and headaches.  A complete 10 system review of systems was obtained and all systems are negative except as noted in the HPI and PMH.    Allergies  Ivp dye  Home Medications   Current Outpatient  Rx  Name  Route  Sig  Dispense  Refill  . AMITRIPTYLINE HCL 25 MG PO TABS   Oral   Take 25 mg by mouth at bedtime.          . ASPIRIN EC 81 MG PO TBEC   Oral   Take 81 mg by mouth daily.         . ATORVASTATIN CALCIUM 10 MG PO TABS   Oral   Take 10 mg by mouth at bedtime.          Marland Kitchen CARVEDILOL 25 MG PO TABS   Oral   Take 25 mg by mouth 2 (two) times daily with a meal.         . GABAPENTIN 100 MG PO CAPS   Oral   Take 100 mg by mouth at bedtime.         Marland Kitchen GLIMEPIRIDE 4 MG PO TABS   Oral   Take 4 mg by mouth 2 (two) times daily with a meal.          . INSULIN GLARGINE 100 UNIT/ML [place] SOLN   Subcutaneous    Inject 20-25 Units into the skin daily. Take 25 units in the morning and 20 units in the evening         . LOSARTAN POTASSIUM-HCTZ 100-25 MG PO TABS   Oral   Take 1 tablet by mouth daily.         Marland Kitchen SAXAGLIPTIN-METFORMIN ER 2.06-998 MG PO TB24   Oral   Take 1 tablet by mouth 2 (two) times daily with a meal.          . WARFARIN SODIUM 5 MG PO TABS   Oral   Take 1 tablet (5 mg total) by mouth daily at 6 PM.   30 tablet   1   . NON FORMULARY      CPAP to be used nightly with oxygen @ 1L.           BP 139/45  Pulse 78  Temp 98.1 F (36.7 C) (Oral)  Resp 24  SpO2 100%  Physical Exam  Constitutional: He is oriented to person, place, and time. He appears well-developed and well-nourished. No distress.  HENT:  Head: Normocephalic and atraumatic.  Mouth/Throat: Oropharynx is clear and moist. No oropharyngeal exudate.  Eyes: Conjunctivae normal and EOM are normal. Pupils are equal, round, and reactive to light.  Neck: Normal range of motion. Neck supple.  Cardiovascular: Normal rate and regular rhythm.   Murmur heard. Pulmonary/Chest: Effort normal and breath sounds normal. No respiratory distress.  Abdominal: Soft. There is no tenderness. There is no rebound and no guarding.  Musculoskeletal: Normal range of motion. He exhibits no edema and no tenderness.  Neurological: He is alert and oriented to person, place, and time. No cranial nerve deficit. He exhibits normal muscle tone. Coordination normal.  Skin: Skin is warm.    ED Course  Procedures (including critical care time)  Labs Reviewed  CBC WITH DIFFERENTIAL - Abnormal; Notable for the following:    RBC 3.02 (*)     Hemoglobin 8.4 (*)     HCT 26.4 (*)     All other components within normal limits  PROTIME-INR - Abnormal; Notable for the following:    Prothrombin Time 29.2 (*)     INR 2.95 (*)     All other components within normal limits  COMPREHENSIVE METABOLIC PANEL  TROPONIN I  PRO B NATRIURETIC  PEPTIDE   Dg Chest  2 View  02/16/2012  *RADIOLOGY REPORT*  Clinical Data: Syncope, shortness of breath  CHEST - 2 VIEW  Comparison: 02/09/2012  Findings: Coronary bypass changes and aortic valve replacement noted.  Stable mild cardiomegaly with chronic interstitial parenchymal opacities and scarring bilaterally.  Low lung volumes persist.  No definite superimposed edema, pneumonia, collapse, consolidation, effusion, or pneumothorax.  Trachea is midline. Degenerative changes of the spine.  IMPRESSION: Stable postoperative and chronic findings.  No superimposed acute process   Original Report Authenticated By: Judie Petit. Fults Costain, M.D.    Ct Head Wo Contrast  02/16/2012  *RADIOLOGY REPORT*  Clinical Data: Loss of consciousness.  Syncope.  CT HEAD WITHOUT CONTRAST  Technique:  Contiguous axial images were obtained from the base of the skull through the vertex without contrast.  Comparison: None.  Findings: The brain shows mild generalized atrophy.  There is no evidence of old or acute focal infarction, mass lesion, hemorrhage, hydrocephalus or extra-axial collection.  There is atherosclerotic calcification of the major vessels at the base of the brain.  The calvarium is unremarkable.  Sinuses, middle ears and mastoids are clear.  IMPRESSION: No acute or focal finding.  Mild atrophy.  Calcific atherosclerosis of the major vessels at the base of the brain.   Original Report Authenticated By: Paulina Fusi, M.D.      1. Syncope       MDM  Patient presents from home after having an episode of unresponsiveness witnessed by his home nurse. He got up out of a chair, became short of breath and had to lay down on the table. Denies chest pain. EKG is unchanged from previous. S/p CABG and AVR 12/20.  Vital stable, no distress, lungs are clear.  D/w Dr. Laneta Simmers.  Patient appears well now. Question transient arrhythmia.  EKG unchanged.  Coumadin therapeutic. D/w Dr. Elease Hashimoto.   he suspects orthostasis but will admit that  patient for observation for arrhythmia.     Date: 02/16/2012  Rate: 73  Rhythm: normal sinus rhythm  QRS Axis: normal  Intervals: normal  ST/T Wave abnormalities: normal  Conduction Disutrbances:right bundle branch block  Narrative Interpretation:   Old EKG Reviewed: unchanged    Glynn Octave, MD 02/16/12 1559

## 2012-02-16 NOTE — ED Notes (Addendum)
Per ems- Pt had syncopal episode lasting , witnessed by pt home health nurse. Pt had triple bypass and valve replacement 02/06/12. Pt denies chest pain, but states before unresponsive episode pt states he was SOB. Pt has crackles auscultated in lower lobes. 20g IV LAC. Pt NSR on monitor, unremarkable 12 lead. BP-110/74 HR-77 O2-98% on 2L. RR-16  CBG-233

## 2012-02-17 ENCOUNTER — Encounter (HOSPITAL_COMMUNITY): Payer: Self-pay | Admitting: General Practice

## 2012-02-17 DIAGNOSIS — I10 Essential (primary) hypertension: Secondary | ICD-10-CM | POA: Diagnosis present

## 2012-02-17 DIAGNOSIS — E119 Type 2 diabetes mellitus without complications: Secondary | ICD-10-CM | POA: Diagnosis present

## 2012-02-17 DIAGNOSIS — I2510 Atherosclerotic heart disease of native coronary artery without angina pectoris: Secondary | ICD-10-CM

## 2012-02-17 DIAGNOSIS — E785 Hyperlipidemia, unspecified: Secondary | ICD-10-CM | POA: Diagnosis present

## 2012-02-17 LAB — PROTIME-INR
INR: 3.23 — ABNORMAL HIGH (ref 0.00–1.49)
Prothrombin Time: 31.2 seconds — ABNORMAL HIGH (ref 11.6–15.2)

## 2012-02-17 LAB — GLUCOSE, CAPILLARY: Glucose-Capillary: 124 mg/dL — ABNORMAL HIGH (ref 70–99)

## 2012-02-17 MED ORDER — POLYSACCHARIDE IRON COMPLEX 150 MG PO CAPS: 150.0000 mg | ORAL_CAPSULE | Freq: Every day | ORAL | Status: DC

## 2012-02-17 MED ORDER — WARFARIN SODIUM 5 MG PO TABS: ORAL_TABLET | ORAL | Status: DC

## 2012-02-17 MED ORDER — POLYSACCHARIDE IRON COMPLEX 150 MG PO CAPS
150.0000 mg | ORAL_CAPSULE | Freq: Every day | ORAL | Status: DC
  Administered 2012-02-17: 150 mg via ORAL
  Filled 2012-02-17: qty 1

## 2012-02-17 MED ORDER — LOSARTAN POTASSIUM 50 MG PO TABS: 50.0000 mg | ORAL_TABLET | Freq: Every day | ORAL | Status: DC

## 2012-02-17 NOTE — Progress Notes (Addendum)
PROGRESS NOTE  Subjective:   Adam Ibarra is a 61 yo , recently s/p CABG and AVR admitted after an episode of syncope .  The event is c/w orthostasis.  He has done well over night.  Tele has been stable.   Objective:    Vital Signs:   Temp:  [98.1 F (36.7 C)-98.7 F (37.1 C)] 98.5 F (36.9 C) (12/31 0507) Pulse Rate:  [73-84] 79  (12/31 0507) Resp:  [7-24] 18  (12/31 0507) BP: (129-153)/(45-63) 135/56 mmHg (12/31 0507) SpO2:  [97 %-100 %] 98 % (12/31 0507) Weight:  [280 lb 4.8 oz (127.143 kg)-281 lb 4.9 oz (127.6 kg)] 280 lb 4.8 oz (127.143 kg) (12/31 0507)      24-hour weight change: Weight change:   Weight trends: Filed Weights   2012/03/15 1749 March 15, 2012 1809 02/17/12 0507  Weight: 281 lb 4.9 oz (127.6 kg) 281 lb 4.9 oz (127.6 kg) 280 lb 4.8 oz (127.143 kg)    Intake/Output:        Physical Exam: BP 135/56  Pulse 79  Temp 98.5 F (36.9 C) (Oral)  Resp 18  Ht 6' (1.829 m)  Wt 280 lb 4.8 oz (127.143 kg)  BMI 38.02 kg/m2  SpO2 98%  General: Vital signs reviewed and noted.   Head: Normocephalic, atraumatic.  Eyes: conjunctivae/corneas clear.  EOM's intact.   Throat: normal  Neck:  normal  Lungs:    clear   Heart:  normal S1, normal mechanical S2  Abdomen:  Soft, non-tender, non-distended    Extremities: Mild edema   Neurologic: A&O X3, CN II - XII are grossly intact.   Psych: Normal   Skin - pale.  Labs: BMET:  Basename Mar 15, 2012 1416  NA 138  K 4.7  CL 101  CO2 27  GLUCOSE 114*  BUN 18  CREATININE 1.25  CALCIUM 9.5  MG --  PHOS --    Liver function tests:  Basename March 15, 2012 1416  AST 33  ALT 37  ALKPHOS 53  BILITOT 0.4  PROT 7.2  ALBUMIN 3.0*   No results found for this basename: LIPASE:2,AMYLASE:2 in the last 72 hours  CBC:  Basename 03-15-12 1416  WBC 8.2  NEUTROABS 6.0  HGB 8.4*  HCT 26.4*  MCV 87.4  PLT 337    Cardiac Enzymes:  Basename 03/15/12 1418  CKTOTAL --  CKMB --  TROPONINI <0.30    Coagulation  Studies:  Basename 02/17/12 0530 Mar 15, 2012 1416  LABPROT 31.2* 29.2*  INR 3.23* 2.95*     Tele:  NSR  Medications:    Infusions:    Scheduled Medications:    . amitriptyline  25 mg Oral QHS  . aspirin EC  81 mg Oral Daily  . atorvastatin  10 mg Oral QHS  . carvedilol  25 mg Oral BID WC  . gabapentin  100 mg Oral QHS  . glimepiride  4 mg Oral BID WC  . insulin aspart  0-9 Units Subcutaneous TID WC  . insulin glargine  20 Units Subcutaneous QHS  . insulin glargine  25 Units Subcutaneous Daily  . iron polysaccharides  150 mg Oral Daily  . linagliptin  5 mg Oral Daily  . losartan  50 mg Oral Daily  . metFORMIN  1,000 mg Oral BID WC  . sodium chloride  3 mL Intravenous Q12H  . warfarin  5 mg Oral q1800  . Warfarin - Physician Dosing Inpatient   Does not apply q1800    Assessment/ Plan:    1. Syncope:  By history, the event was c/w orthostasis in the setting of recent surgery, anemia.  No arrhythmias on tele.  We have held the HCTZ for now and lowered the dose of Losartan to 50 mg a day.  He will follow up with his medical doctor to have a CBC and Dr. Eden Emms in several weeks.  2. Anemia.  No blood in stool per patient.  Will start Nu-iron 150 daily.  He will need to have his CBC checked by his medical doctor next week.  I have told him that his Hb is 8.4 today.  3. S/p AVR:  His INR is drifting slightly higher.  Goal INR of 2.0 - 3.0.   Will hold coumadin tonight.  Will decrease the home dose to 5 mg a day 5 days a week ( M,W,Th,S,S) , 2.5 mg a day 2 days a week (Tues., Friday)  Disposition: DC to home  Length of Stay: 1  Vesta Mixer, Montez Hageman., MD, [facility] 02/17/2012, 8:13 AM Office [phone] Pager [phone]

## 2012-02-17 NOTE — Discharge Summary (Signed)
Patient ID: Adam Ibarra,  MRN: [id number], DOB/AGE: [birth year] 61 y.o.  Admit date: 02/16/2012 Discharge date: 02/17/2012  Primary Care Provider: Lyndon Code Primary Cardiologist: Judie Petit. Kirke Corin, MD  Discharge Diagnoses Principal Problem:  *Syncope  **Orthostatic. 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 Ivp Dye (Iodinated Diagnostic Agents) Itching    Can tolerate if takes benadryl   Procedures  None  History of Present Illness  61 y/o male with the above problem list.  He recently underwent CABG x 3 with St. Jude mechanical Aortic Valve replacement on 02/06/2012.  He was subsequently discharged from [facility] on 12/27 with plans for home health.  He had been doing well when on 12/30, after rising to answer the door, he became acutely dyspneic and lightheaded.  It was the home health nurse who was at the door and pt became syncopal.  He fell to the floor and per report from [facility], he was unresponsive for approximately 2 minutes.   During that time, he was incontinent of urine.  EMS was called and he had stabilized by their arrival.  He was taken to the [facility] ED for further evaluation.  There he was stable without complaints.  He was placed in observation overnight.  Hospital Course  Pt had no further pre-syncope or syncope.  He had no arrhythmias on the monitor.  It is felt that his symptoms were primarily orthostatic in nature and as such, we have discontinued his HCTZ and reduced his dose of losartan from 100mg  daily to 50mg  daily.  His lab work is notable only for normocytic anemia with an H/H of 8.4/26.4, which has been stable dating back to the immediate post-op period.  He has been placed on iron supplementation.  As his INR trend shows a rapid rise since d/c, with an INR of 3.23 today, we have  reduced his coumadin dosing as outlined below and plan to have a f/u INR on Friday 1/3 through home health.  He will be discharged home today in good condition and will f/u in our office next week.  Discharge Vitals Blood pressure 135/56, pulse 79, temperature 98.5 F (36.9 C), temperature source Oral, resp. rate 18, height 6' (1.829 m), weight 280 lb 4.8 oz (127.143 kg), SpO2 98.00%.  Filed Weights   02/16/12 1749 02/16/12 1809 02/17/12 0507  Weight: 281 lb 4.9 oz (127.6 kg) 281 lb 4.9 oz (127.6 kg) 280 lb 4.8 oz (127.143 kg)   Labs  CBC  Basename 02/16/12 1416  WBC 8.2  NEUTROABS 6.0  HGB 8.4*  HCT 26.4*  MCV 87.4  PLT 337   Basic Metabolic Panel  Basename 02/16/12 1416  NA 138  K 4.7  CL 101  CO2 27  GLUCOSE 114*  BUN 18  CREATININE 1.25  CALCIUM 9.5  MG --  PHOS --   Liver Function Tests  Basename 02/16/12 1416  AST 33  ALT 37  ALKPHOS 53  BILITOT 0.4  PROT 7.2  ALBUMIN 3.0*   Cardiac Enzymes  Basename 02/16/12 1418  CKTOTAL --  CKMB --  CKMBINDEX --  TROPONINI <0.30   Lab Results  Component Value Date   INR 3.23* 02/17/2012   INR 2.95* 02/16/2012   INR 1.87* 02/13/2012    Disposition  Pt is being discharged home today in good condition.  Follow-up Plans &  Appointments  Follow-up Information    Follow up with Alleen Borne, MD. On 03/02/2012. (2 PM)    Contact information:   [address] [place] [address] [phone]       Follow up with Nicolasa Ducking, NP. On 02/25/2012. (11:30 AM - Dr. Jari Sportsman Nurse Practitioner)    Contact information:   [address] [place] [address] [phone]         Discharge Medications    Medication List     As of 02/17/2012 11:27 AM    STOP taking these medications         losartan-hydrochlorothiazide 100-25 MG per tablet   Commonly known as: HYZAAR   Replaced by: losartan 50 MG tablet      TAKE these medications         amitriptyline 25 MG tablet    Commonly known as: ELAVIL   Take 25 mg by mouth at bedtime.      aspirin EC 81 MG tablet   Take 81 mg by mouth daily.      atorvastatin 10 MG tablet   Commonly known as: LIPITOR   Take 10 mg by mouth at bedtime.      carvedilol 25 MG tablet   Commonly known as: COREG   Take 25 mg by mouth 2 (two) times daily with a meal.      gabapentin 100 MG capsule   Commonly known as: NEURONTIN   Take 100 mg by mouth at bedtime.      glimepiride 4 MG tablet   Commonly known as: AMARYL   Take 4 mg by mouth 2 (two) times daily with a meal.      insulin glargine 100 UNIT/ML injection   Commonly known as: LANTUS   Inject 20-25 Units into the skin daily. Take 25 units in the morning and 20 units in the evening      iron polysaccharides 150 MG capsule   Commonly known as: NIFEREX   Take 1 capsule (150 mg total) by mouth daily.      KOMBIGLYZE XR 2.06-998 MG Tb24   Generic drug: Saxagliptin-Metformin   Take 1 tablet by mouth 2 (two) times daily with a meal.      losartan 50 MG tablet   Commonly known as: COZAAR   Take 1 tablet (50 mg total) by mouth daily.      NON FORMULARY   CPAP to be used nightly with oxygen @ 1L.      warfarin 5 MG tablet   Commonly known as: COUMADIN   1 Tablet on Monday, Wednesday, Thursday, Saturday, Sunday;  1/2 Tablet on Tuesday & Friday      Outstanding Labs/Studies  Follow-up INR on Friday 1/3 via Home Health (Goal INR 2.5).  Duration of Discharge Encounter   Greater than 30 minutes including physician time.  Signed, Christopher Berge NP 02/17/2012, 11:27 AM   Attending note:  See my note from earlier today.  Philip Nahser, MD 1:00 PM     12 /31/13

## 2012-02-17 NOTE — Care Management Note (Signed)
    Page 1 of 1   02/17/2012     11:38:19 AM   CARE MANAGEMENT NOTE 02/17/2012  Patient:  Adam Ibarra,Adam Ibarra   Account Number:  [id number]  Date Initiated:  02/17/2012  Documentation initiated by:  Junius Creamer  Subjective/Objective Assessment:   adm w syncope     Action/Plan:   pcp dr Beverely Risen, lives w wife, act w [facility] for hhrn   Anticipated DC Date:  02/17/2012   Anticipated DC Plan:  HOME W HOME HEALTH SERVICES      DC Planning Services  CM consult      [facility] Choice  Resumption Of Svcs/PTA Provider   Choice offered to / List presented to:          [facility] arranged  HH-1 RN      [facility] agency  [facility]   Status of service:   Medicare Important Message given?   (If response is "NO", the following Medicare IM given date fields will be blank) Date Medicare IM given:   Date Additional Medicare IM given:    Discharge Disposition:  HOME W HOME HEALTH SERVICES  Per UR Regulation:    If discussed at Long Length of Stay Meetings, dates discussed:    Comments:  12/31 1137a debbie Julianah Marciel rn,bsn have alerted kristen w [facility] of poss dc and for hhrn to draw inr on 1-3 and call to dr Kirke Corin.

## 2012-02-19 ENCOUNTER — Encounter: Payer: Self-pay | Admitting: *Deleted

## 2012-02-20 ENCOUNTER — Encounter: Payer: Self-pay | Admitting: Surgery

## 2012-02-24 ENCOUNTER — Telehealth: Payer: Self-pay

## 2012-02-24 ENCOUNTER — Ambulatory Visit: Payer: Self-pay | Admitting: Cardiology

## 2012-02-24 DIAGNOSIS — Z952 Presence of prosthetic heart valve: Secondary | ICD-10-CM

## 2012-02-24 DIAGNOSIS — I350 Nonrheumatic aortic (valve) stenosis: Secondary | ICD-10-CM

## 2012-02-24 DIAGNOSIS — Z7901 Long term (current) use of anticoagulants: Secondary | ICD-10-CM | POA: Insufficient documentation

## 2012-02-24 LAB — POCT INR: INR: 3.7

## 2012-02-24 NOTE — Telephone Encounter (Signed)
Please review

## 2012-02-24 NOTE — Telephone Encounter (Signed)
See anticoagulation note in EPIC pt's INR and Coumadin dosage addressed with both pt and [facility] RN.

## 2012-02-24 NOTE — Telephone Encounter (Signed)
Patient was discharge one week ago from [facility] results of the PT 44, INR 3.7. The patient is taking warfarin 5 mg daily except Tues and one other day at 2.5 mg.   [name] with [facility] [phone]. Please call with instructions.

## 2012-02-24 NOTE — Telephone Encounter (Signed)
lmtcb on Adam Ibarra's voicemail

## 2012-02-25 ENCOUNTER — Ambulatory Visit (INDEPENDENT_AMBULATORY_CARE_PROVIDER_SITE_OTHER): Payer: 59 | Admitting: Nurse Practitioner

## 2012-02-25 ENCOUNTER — Encounter: Payer: Self-pay | Admitting: Nurse Practitioner

## 2012-02-25 VITALS — BP 124/62 | HR 78 | Ht 72.0 in | Wt 271.5 lb

## 2012-02-25 DIAGNOSIS — Z954 Presence of other heart-valve replacement: Secondary | ICD-10-CM

## 2012-02-25 DIAGNOSIS — I2510 Atherosclerotic heart disease of native coronary artery without angina pectoris: Secondary | ICD-10-CM

## 2012-02-25 DIAGNOSIS — R55 Syncope and collapse: Secondary | ICD-10-CM

## 2012-02-25 DIAGNOSIS — I350 Nonrheumatic aortic (valve) stenosis: Secondary | ICD-10-CM

## 2012-02-25 DIAGNOSIS — E785 Hyperlipidemia, unspecified: Secondary | ICD-10-CM

## 2012-02-25 DIAGNOSIS — I10 Essential (primary) hypertension: Secondary | ICD-10-CM

## 2012-02-25 DIAGNOSIS — Z952 Presence of prosthetic heart valve: Secondary | ICD-10-CM

## 2012-02-25 DIAGNOSIS — I359 Nonrheumatic aortic valve disorder, unspecified: Secondary | ICD-10-CM

## 2012-02-25 DIAGNOSIS — Z951 Presence of aortocoronary bypass graft: Secondary | ICD-10-CM

## 2012-02-25 NOTE — Patient Instructions (Addendum)
Your physician wants you to follow-up in: 3 months with Dr. Arida. You will receive a reminder letter in the mail two months in advance. If you don't receive a letter, please call our office to schedule the follow-up appointment.  

## 2012-02-25 NOTE — Progress Notes (Addendum)
Patient Name: Adam Ibarra Date of Encounter: 02/25/2012  Primary Care Provider:  Lyndon Code, MD Primary Cardiologist:  Judie Petit. Kirke Corin, MD  Patient Profile  62 y/o male with h/o CAD and AS s/p CABG and AVR, who presents for f/u after recently being hospitalized after a syncopal spell.  Problem List   Past Medical History  Diagnosis Date  . Arthritis   . Hyperlipidemia   . Nephrolithiasis   . Type II diabetes mellitus   . Aortic stenosis     a. 01/2012 s/p AVR with SJM 23mm mechanical valve.  . Obesity   . Coronary artery disease     a. 01/2012 s/p CABG x 3 (LIMA->LAD, VG->OM1,VG->PDA)  . Sleep apnea     a. on CPAP  . Hypertension   . Syncope     a. 01/2012 - presumed to be orthostatic in nature.   Past Surgical History  Procedure Date  . Vasectomy   . Vasectomy reversal   . Appendectomy   . Cataract extraction, bilateral   . Coronary artery bypass graft 02/06/2012    Procedure: CORONARY ARTERY BYPASS GRAFTING (CABG);  Surgeon: Alleen Borne, MD;  Location: [facility] OR;  Service: Open Heart Surgery;  Laterality: N/A;  CABG x three,  using left internal mammary artery and right leg greater saphenous vein harvested endoscopically  . Aortic valve replacement 02/06/2012    Procedure: AORTIC VALVE REPLACEMENT (AVR);  Surgeon: Alleen Borne, MD;  Location: [facility] OR;  Service: Open Heart Surgery;  Laterality: N/A;  . Cardiac catheterization 01/23/2012    [facility]   Allergies  Allergies  Allergen Reactions  . Ivp Dye (Iodinated Diagnostic Agents) Itching    Can tolerate if takes benadryl   HPI  62 year old male with the above problem list. He is recently status post coronary artery bypass grafting x3 as well as placement of a St. Jude mechanical aortic valve in December of last year. He has been on Coumadin since. On December 30, he was readmitted following a syncopal spell that occurred at home and was witnessed by home health nurse. Hospital evaluation at [facility] was unrevealing and  it was felt that this spell represented an orthostatic syncopal episode. His losartan dose was reduced from 100 mg daily to 50 mg daily and he was discharged on December 31. Since his discharge, he has had no recurrence of presyncope or syncope. Further, he denies chest pain or dyspnea. He does have mild lower extremity edema. His surgical incision sites have all been healing well and he has followup with CT surgery next week.  Home Medications  Prior to Admission medications   Medication Sig Start Date End Date Taking? Authorizing Provider  amitriptyline (ELAVIL) 25 MG tablet Take 25 mg by mouth at bedtime.    Yes Historical Provider, MD  aspirin EC 81 MG tablet Take 81 mg by mouth daily.   Yes Historical Provider, MD  atorvastatin (LIPITOR) 10 MG tablet Take 10 mg by mouth at bedtime.    Yes Historical Provider, MD  carvedilol (COREG) 25 MG tablet Take 25 mg by mouth 2 (two) times daily with a meal.   Yes Historical Provider, MD  gabapentin (NEURONTIN) 100 MG capsule Take 100 mg by mouth at bedtime.   Yes Historical Provider, MD  glimepiride (AMARYL) 4 MG tablet Take 4 mg by mouth 2 (two) times daily with a meal.    Yes Historical Provider, MD  iron polysaccharides (NIFEREX) 150 MG capsule Take 1 capsule (150 mg  total) by mouth daily. 02/17/12  Yes Ok Anis, NP  losartan (COZAAR) 50 MG tablet Take 1 tablet (50 mg total) by mouth daily. 02/17/12  Yes Ok Anis, NP  NON FORMULARY CPAP to be used nightly with oxygen @ 1L.   Yes Historical Provider, MD  Saxagliptin-Metformin (KOMBIGLYZE XR) 2.06-998 MG TB24 Take 1 tablet by mouth 2 (two) times daily with a meal.    Yes Historical Provider, MD  warfarin (COUMADIN) 5 MG tablet 1 Tablet on Monday, Wednesday, Thursday, Saturday, Sunday; 1/2 Tablet on Tuesday & Friday 02/17/12  Yes Ok Anis, NP  insulin glargine (LANTUS) 100 UNIT/ML injection Inject 20-25 Units into the skin daily. Take 25 units in the morning and 20 units  in the evening    Historical Provider, MD   Review of Systems  He denies chest pain, pnd, orthopnea, n, v, dizziness, syncope, edema, weight gain, or early satiety.  All other systems reviewed and are otherwise negative except as noted above.  Physical Exam  Blood pressure 124/62, pulse 78, height 6' (1.829 m), weight 271 lb 8 oz (123.152 kg).  General: Pleasant, NAD Psych: Normal affect. Neuro: Alert and oriented X 3. Moves all extremities spontaneously. HEENT: Normal  Neck: Supple without bruits or JVD. Lungs:  Resp regular and unlabored, CTA. Heart: RRR no s3, s4, or murmurs.  Crisp S2. Abdomen: Soft, non-tender, non-distended, BS + x 4. Chest wall incision and abdominal incisions are healing well. Extremities: No clubbing, cyanosis.  Trace bilat LE edema. DP/PT/Radials 2+ and equal bilaterally.  Accessory Clinical Findings  ECG - rsr, 78, RBBB.  Assessment & Plan  1.  Coronary artery disease: Patient is status post coronary artery bypass grafting x3 in December. He is doing very well and plans to enroll in cardiac rehabilitation. He remains on aspirin, statin, beta blocker, and ARB therapy.  2. Aortic stenosis: Status post St. Jude mechanical aortic valve replacement. He is now on Coumadin therapy and his INR is followed in this office. INR was elevated yesterday at 3.7 and his Coumadin was adjusted.  3. Syncope: Presumed to be secondary to orthostasis. He's been feeling better with reduced dose losartan.  4. Hypertension: Well controlled.  5. Hyperlipidemia: Continue statin therapy.  6. Diabetes mellitus: Patient has noted marked improvement in blood sugars recently and says that he has not needed to take Lantus. He has lost nearly 20 pounds since prior to his surgery. He'll continue to followup with primary care regarding this issue.  7. Disposition: Followup with Dr. Kirke Corin in 3 mos.  Nicolasa Ducking, NP 02/25/2012, 12:49 PM

## 2012-02-27 ENCOUNTER — Other Ambulatory Visit: Payer: Self-pay | Admitting: *Deleted

## 2012-02-27 DIAGNOSIS — I2510 Atherosclerotic heart disease of native coronary artery without angina pectoris: Secondary | ICD-10-CM

## 2012-03-01 ENCOUNTER — Telehealth: Payer: Self-pay

## 2012-03-01 ENCOUNTER — Ambulatory Visit: Payer: Self-pay | Admitting: Cardiology

## 2012-03-01 DIAGNOSIS — I350 Nonrheumatic aortic (valve) stenosis: Secondary | ICD-10-CM

## 2012-03-01 DIAGNOSIS — Z7901 Long term (current) use of anticoagulants: Secondary | ICD-10-CM

## 2012-03-01 DIAGNOSIS — Z952 Presence of prosthetic heart valve: Secondary | ICD-10-CM

## 2012-03-01 NOTE — Telephone Encounter (Signed)
I received t/c from Amy with [facility] saying pts BP has been elevated Readings are as follows:170/84, 158/78, 163/80, 160/75, HR=82, 75 She asks if we want to make any changes to call pt/herself at [phone]

## 2012-03-01 NOTE — Telephone Encounter (Signed)
Please see BP readings below Losartan dose was recently decreased thanks

## 2012-03-02 ENCOUNTER — Other Ambulatory Visit: Payer: Self-pay

## 2012-03-02 ENCOUNTER — Ambulatory Visit (INDEPENDENT_AMBULATORY_CARE_PROVIDER_SITE_OTHER): Payer: Self-pay | Admitting: Surgery

## 2012-03-02 ENCOUNTER — Ambulatory Visit
Admission: RE | Admit: 2012-03-02 | Discharge: 2012-03-02 | Disposition: A | Discharge status: Home / Self Care | Payer: 59 | Source: Ambulatory Visit | Attending: Surgery | Admitting: Surgery

## 2012-03-02 ENCOUNTER — Encounter: Payer: Self-pay | Admitting: Surgery

## 2012-03-02 VITALS — BP 140/80 | HR 83 | Resp 20 | Ht 72.0 in | Wt 271.0 lb

## 2012-03-02 DIAGNOSIS — I350 Nonrheumatic aortic (valve) stenosis: Secondary | ICD-10-CM

## 2012-03-02 DIAGNOSIS — I2510 Atherosclerotic heart disease of native coronary artery without angina pectoris: Secondary | ICD-10-CM

## 2012-03-02 DIAGNOSIS — Z954 Presence of other heart-valve replacement: Secondary | ICD-10-CM

## 2012-03-02 DIAGNOSIS — Z952 Presence of prosthetic heart valve: Secondary | ICD-10-CM

## 2012-03-02 DIAGNOSIS — I359 Nonrheumatic aortic valve disorder, unspecified: Secondary | ICD-10-CM

## 2012-03-02 DIAGNOSIS — Z951 Presence of aortocoronary bypass graft: Secondary | ICD-10-CM

## 2012-03-02 MED ORDER — LOSARTAN POTASSIUM 100 MG PO TABS: 100.0000 mg | ORAL_TABLET | Freq: Every day | ORAL | Status: DC

## 2012-03-02 NOTE — Progress Notes (Signed)
[address]            [place] [address]          [phone]     HPI:  Patient returns for routine postoperative follow-up having undergone coronary bypass graft surgery x3, aortic valve replacement using a 23 mm St. Jude [employer] mechanical valve, and wedge biopsy of the right upper lobe of the lung on 02/06/2012. The patient's early postoperative recovery while in the hospital was notable for an uncomplicated postoperative course. Since hospital discharge the patient reports he has felt much better than he did prior to surgery. He has mild chest wall soreness. His breathing has significantly improved. His INR has been drawn by home health nursing with the results sent to the [facility] office in [place]. He said his INR increased to around 5 and his Coumadin dose has subsequently been decreased to 2.5 mg per day.   Current Outpatient Prescriptions  Medication Sig Dispense Refill  . amitriptyline (ELAVIL) 25 MG tablet Take 25 mg by mouth at bedtime.       Marland Kitchen aspirin EC 81 MG tablet Take 81 mg by mouth daily.      Marland Kitchen atorvastatin (LIPITOR) 10 MG tablet Take 10 mg by mouth at bedtime.       . carvedilol (COREG) 25 MG tablet Take 25 mg by mouth 2 (two) times daily with a meal.      . gabapentin (NEURONTIN) 100 MG capsule Take 100 mg by mouth at bedtime.      Marland Kitchen glimepiride (AMARYL) 4 MG tablet Take 4 mg by mouth 2 (two) times daily with a meal.       . iron polysaccharides (NIFEREX) 150 MG capsule Take 1 capsule (150 mg total) by mouth daily.  30 capsule  6  . losartan (COZAAR) 100 MG tablet Take 1 tablet (100 mg total) by mouth daily.  90 tablet  3  . NON FORMULARY CPAP to be used nightly with oxygen @ 1L.      . Saxagliptin-Metformin (KOMBIGLYZE XR) 2.06-998 MG TB24 Take 1 tablet by mouth 2 (two) times daily with a meal.       . warfarin (COUMADIN) 5 MG tablet 1 Tablet on Monday, Wednesday, Thursday, Saturday, Sunday; 1/2 Tablet on Tuesday & Friday  30  tablet  1  . insulin glargine (LANTUS) 100 UNIT/ML injection Inject 20-25 Units into the skin daily. Take 25 units in the morning and 20 units in the evening        Physical Exam: BP 140/80  Pulse 83  Resp 20  Ht 6' (1.829 m)  Wt 271 lb (122.925 kg)  BMI 36.75 kg/m2  SpO2 96% He looks well. Cardiac exam shows a regular rate and rhythm with a crisp mechanical valve click. Lung exam is clear. The chest incision is healing well and sternum is stable. The right leg incision is healing well and there is minimal right lower leg edema.  Diagnostic Tests:  Pathology  Patient Name: LEHI, PHIFER Accession #: [id number] DOB: [birth year] Age: 62 Gender: M Client Name Eligha Bridegroom. [facility] Collected Date: 02/06/2012 Received Date: 02/06/2012 Physician: Evelene Croon Chart #: MRN # : [id number] Physician cc: Race:W Visit #: [id number] REPORT OF SURGICAL PATHOLOGY FINAL DIAGNOSIS Diagnosis 1. Heart valve leaflets, Aortic - HEART VALVE LEAFLETS: NODULAR FIBROSIS AND CALCIFICATION. - THERE IS NO EVIDENCE OF MALIGNANCY. 2. Lung, wedge  biopsy/resection, Right upper - DENSELY INFLAMED AND FIBROTIC LUNG PARENCHYMA WITH FEATURES OF HONEYCOMB LUNG (END STAGE LUNG). - THERE IS NO EVIDENCE OF MALIGNANCY. - SEE COMMENT. Microscopic Comment 2. The leading differential diagnosis includes end stage lung disease secondary to usual interstitial pneumonia (UIP). Pecola Leisure MD Pathologist, Electronic Signature (Case signed 02/09/2012) Specimen Gross and Clinical Information Specimen(s) Obtained: 1. Heart valve leaflets, Aortic 2. Lung, wedge biopsy/resection, Right upper Specimen Clinical Information 1. CAD (tl) 1 of   RADIOLOGY REPORT*   Clinical Data: Cough, shortness of breath.  Bilateral lateral chest pain.  Post heart surgery.   CHEST - 2 VIEW   Comparison: 02/16/2012   Findings: Prior valve replacement and CABG.  Stable chronic changes in the lungs.  No effusions, acute  infiltrates or pneumothorax. Degenerative changes in the thoracic spine.   IMPRESSION: Stable severe chronic changes in the lungs.  No acute findings.     Original Report Authenticated By: Charlett Nose, M.D.  Impression:  Overall he is making a very good recovery following his surgery. I was concerned about his interstitial lung disease and how that would affect his postoperative recovery but it does not seem to have any major effect. 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 He is going to followup with his pulmonary physician concerning that. 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  Plan:  He will continue followup with Dr. Kennith Maes in [place] and will contact me if he develops any problems with his incisions. He will followup with his pulmonary physician, Dr.Khan concerning his lung disease.

## 2012-03-02 NOTE — Telephone Encounter (Signed)
Increase Losartan to 100 mg once daily. 

## 2012-03-02 NOTE — Telephone Encounter (Signed)
Pt informed Understanding verb New RX sent to pharmacy I will also notify amy with [facility]

## 2012-03-05 ENCOUNTER — Encounter: Payer: Self-pay | Admitting: Cardiovascular Disease

## 2012-03-08 ENCOUNTER — Ambulatory Visit: Payer: Self-pay | Admitting: Cardiology

## 2012-03-08 DIAGNOSIS — I350 Nonrheumatic aortic (valve) stenosis: Secondary | ICD-10-CM

## 2012-03-08 DIAGNOSIS — Z952 Presence of prosthetic heart valve: Secondary | ICD-10-CM

## 2012-03-08 DIAGNOSIS — Z7901 Long term (current) use of anticoagulants: Secondary | ICD-10-CM

## 2012-03-18 ENCOUNTER — Other Ambulatory Visit: Payer: Self-pay

## 2012-03-18 ENCOUNTER — Ambulatory Visit (INDEPENDENT_AMBULATORY_CARE_PROVIDER_SITE_OTHER): Payer: 59

## 2012-03-18 DIAGNOSIS — Z7901 Long term (current) use of anticoagulants: Secondary | ICD-10-CM

## 2012-03-18 DIAGNOSIS — Z954 Presence of other heart-valve replacement: Secondary | ICD-10-CM

## 2012-03-18 DIAGNOSIS — Z952 Presence of prosthetic heart valve: Secondary | ICD-10-CM

## 2012-03-18 DIAGNOSIS — I350 Nonrheumatic aortic (valve) stenosis: Secondary | ICD-10-CM

## 2012-03-18 DIAGNOSIS — I359 Nonrheumatic aortic valve disorder, unspecified: Secondary | ICD-10-CM

## 2012-03-20 ENCOUNTER — Encounter: Payer: Self-pay | Admitting: Cardiovascular Disease

## 2012-03-31 ENCOUNTER — Ambulatory Visit (INDEPENDENT_AMBULATORY_CARE_PROVIDER_SITE_OTHER): Payer: 59

## 2012-03-31 ENCOUNTER — Telehealth: Payer: Self-pay | Admitting: *Deleted

## 2012-03-31 DIAGNOSIS — I359 Nonrheumatic aortic valve disorder, unspecified: Secondary | ICD-10-CM

## 2012-03-31 DIAGNOSIS — Z7901 Long term (current) use of anticoagulants: Secondary | ICD-10-CM

## 2012-03-31 DIAGNOSIS — I350 Nonrheumatic aortic (valve) stenosis: Secondary | ICD-10-CM

## 2012-03-31 DIAGNOSIS — Z952 Presence of prosthetic heart valve: Secondary | ICD-10-CM

## 2012-03-31 DIAGNOSIS — Z954 Presence of other heart-valve replacement: Secondary | ICD-10-CM

## 2012-03-31 LAB — POCT INR: INR: 2

## 2012-03-31 NOTE — Telephone Encounter (Signed)
Pt had AVR Needs prophylactic abx Which med do you suggest?

## 2012-03-31 NOTE — Telephone Encounter (Signed)
Pt needs to know if he needs pre meds for dental cleaning. Dr. Donnamarie Poag at [facility]

## 2012-04-02 NOTE — Telephone Encounter (Signed)
Amoxicillin 2 grams 30-60 minutes before the procedure.

## 2012-04-03 ENCOUNTER — Other Ambulatory Visit: Payer: Self-pay

## 2012-04-05 ENCOUNTER — Other Ambulatory Visit: Payer: Self-pay

## 2012-04-05 MED ORDER — AMOXICILLIN 500 MG PO TABS: ORAL_TABLET | ORAL | Status: DC

## 2012-04-05 NOTE — Telephone Encounter (Signed)
RX sent to pharmacy  

## 2012-04-05 NOTE — Telephone Encounter (Signed)
lmtcb

## 2012-04-08 ENCOUNTER — Other Ambulatory Visit: Payer: Self-pay | Admitting: *Deleted

## 2012-04-08 MED ORDER — LOSARTAN POTASSIUM 100 MG PO TABS: 100.0000 mg | ORAL_TABLET | Freq: Every day | ORAL | Status: DC

## 2012-04-17 ENCOUNTER — Encounter: Payer: Self-pay | Admitting: Cardiovascular Disease

## 2012-04-21 ENCOUNTER — Ambulatory Visit (INDEPENDENT_AMBULATORY_CARE_PROVIDER_SITE_OTHER): Payer: 59

## 2012-04-21 DIAGNOSIS — Z952 Presence of prosthetic heart valve: Secondary | ICD-10-CM

## 2012-04-21 DIAGNOSIS — I350 Nonrheumatic aortic (valve) stenosis: Secondary | ICD-10-CM

## 2012-04-21 DIAGNOSIS — I359 Nonrheumatic aortic valve disorder, unspecified: Secondary | ICD-10-CM

## 2012-04-21 DIAGNOSIS — Z954 Presence of other heart-valve replacement: Secondary | ICD-10-CM

## 2012-04-21 DIAGNOSIS — Z7901 Long term (current) use of anticoagulants: Secondary | ICD-10-CM

## 2012-04-21 LAB — POCT INR: INR: 1.8

## 2012-05-12 ENCOUNTER — Ambulatory Visit (INDEPENDENT_AMBULATORY_CARE_PROVIDER_SITE_OTHER): Payer: 59

## 2012-05-12 DIAGNOSIS — I350 Nonrheumatic aortic (valve) stenosis: Secondary | ICD-10-CM

## 2012-05-12 DIAGNOSIS — I359 Nonrheumatic aortic valve disorder, unspecified: Secondary | ICD-10-CM

## 2012-05-12 DIAGNOSIS — Z954 Presence of other heart-valve replacement: Secondary | ICD-10-CM

## 2012-05-12 DIAGNOSIS — Z952 Presence of prosthetic heart valve: Secondary | ICD-10-CM

## 2012-05-12 DIAGNOSIS — Z7901 Long term (current) use of anticoagulants: Secondary | ICD-10-CM

## 2012-05-12 LAB — POCT INR: INR: 1.5

## 2012-05-18 ENCOUNTER — Other Ambulatory Visit: Payer: Self-pay | Admitting: Physician Assistant

## 2012-05-24 ENCOUNTER — Ambulatory Visit (INDEPENDENT_AMBULATORY_CARE_PROVIDER_SITE_OTHER): Payer: 59 | Admitting: Cardiovascular Disease

## 2012-05-24 ENCOUNTER — Ambulatory Visit (INDEPENDENT_AMBULATORY_CARE_PROVIDER_SITE_OTHER): Payer: 59

## 2012-05-24 ENCOUNTER — Encounter: Payer: Self-pay | Admitting: Cardiovascular Disease

## 2012-05-24 VITALS — BP 160/80 | HR 76 | Ht 72.0 in | Wt 271.0 lb

## 2012-05-24 DIAGNOSIS — Z952 Presence of prosthetic heart valve: Secondary | ICD-10-CM

## 2012-05-24 DIAGNOSIS — Z954 Presence of other heart-valve replacement: Secondary | ICD-10-CM

## 2012-05-24 DIAGNOSIS — I872 Venous insufficiency (chronic) (peripheral): Secondary | ICD-10-CM

## 2012-05-24 DIAGNOSIS — I350 Nonrheumatic aortic (valve) stenosis: Secondary | ICD-10-CM

## 2012-05-24 DIAGNOSIS — I359 Nonrheumatic aortic valve disorder, unspecified: Secondary | ICD-10-CM

## 2012-05-24 DIAGNOSIS — I10 Essential (primary) hypertension: Secondary | ICD-10-CM

## 2012-05-24 DIAGNOSIS — Z7901 Long term (current) use of anticoagulants: Secondary | ICD-10-CM

## 2012-05-24 DIAGNOSIS — I2510 Atherosclerotic heart disease of native coronary artery without angina pectoris: Secondary | ICD-10-CM

## 2012-05-24 DIAGNOSIS — E785 Hyperlipidemia, unspecified: Secondary | ICD-10-CM

## 2012-05-24 HISTORY — DX: Venous insufficiency (chronic) (peripheral): I87.2

## 2012-05-24 LAB — POCT INR: INR: 1.6

## 2012-05-24 MED ORDER — ATORVASTATIN CALCIUM 40 MG PO TABS: 40.0000 mg | ORAL_TABLET | Freq: Every day | ORAL | Status: DC

## 2012-05-24 NOTE — Patient Instructions (Addendum)
Increase Atorvastatin to 40 mg daily.  Check fasting labs in 4-6 weeks.   Use knee-high low-medium pressure support stockings during the day.   Follow up in 6 months.

## 2012-05-24 NOTE — Assessment & Plan Note (Signed)
Continue long-term anticoagulation with warfarin with a goal INR between 2 and 3. Antibiotic prophylaxis is recommended before dental procedures.

## 2012-05-24 NOTE — Assessment & Plan Note (Signed)
He is doing well with no symptoms suggestive of angina. Continue medical therapy. 

## 2012-05-24 NOTE — Assessment & Plan Note (Signed)
Given his established history of coronary artery disease as well as diabetes, I recommend increasing the dose of atorvastatin 40 mg once daily. Recheck fasting lipid profile in 4-6 weeks.

## 2012-05-24 NOTE — Progress Notes (Signed)
Primary care physician: Dr. Beverely Risen  HPI  This is a 62 year old male who is here today for a followup visit regarding coronary artery disease and aortic valve replacement. He is status post coronary artery bypass grafting x3 as well as placement of a St. Jude mechanical aortic valve in December of 2013. He has been on Coumadin since. On December 30, he was readmitted following a syncopal spell that occurred at home and was witnessed by home health nurse. Hospital evaluation at [facility] was unrevealing and it was felt that this spell represented an orthostatic syncopal episode. His losartan dose was reduced from 100 mg daily to 50 mg daily and he was discharged on December 31. He attended cardiac rehabilitation. His blood pressure started going up gradually. We increased the dose of losartan 100 mg once daily. Hydrochlorothiazide 25 mg daily was also added by Dr.Khan about 2 weeks ago. His blood pressure improved since then but appears to be elevated today. Overall, he has been doing very well and denies any chest pain or significant dyspnea. His main issue seems to be lower extremity edema worse on the right side. 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  Allergies  Allergen Reactions  . Ivp Dye (Iodinated Diagnostic Agents) Itching    Can tolerate if takes benadryl     Current Outpatient Prescriptions on File Prior to Visit  Medication Sig Dispense Refill  . amitriptyline (ELAVIL) 25 MG tablet Take 25 mg by mouth at bedtime.       Marland Kitchen aspirin EC 81 MG tablet Take 81 mg by mouth daily.      . carvedilol (COREG) 25 MG tablet Take 25 mg by mouth 2 (two) times daily with a meal.      . gabapentin (NEURONTIN) 100 MG capsule Take 100 mg by mouth at bedtime.      Marland Kitchen glimepiride (AMARYL) 4 MG tablet Take 4 mg by mouth 2 (two) times daily with a meal.       . iron polysaccharides (NIFEREX) 150 MG capsule Take 1 capsule (150 mg total) by mouth daily.  30 capsule  6  .  NON FORMULARY CPAP to be used nightly with oxygen @ 1L.      . Saxagliptin-Metformin (KOMBIGLYZE XR) 2.06-998 MG TB24 Take 1 tablet by mouth 2 (two) times daily with a meal.       . warfarin (COUMADIN) 5 MG tablet TAKE 1 TABLET BY MOUTH EVERY DAY AT 6PM  30 tablet  1   No current facility-administered medications on file prior to visit.     Past Medical History  Diagnosis Date  . Arthritis   . Hyperlipidemia   . Nephrolithiasis   . Type II diabetes mellitus   . Aortic stenosis     a. 01/2012 s/p AVR with SJM 23mm mechanical valve.  . Obesity   . Coronary artery disease     a. 01/2012 s/p CABG x 3 (LIMA->LAD, VG->OM1,VG->PDA)  . Sleep apnea     a. on CPAP  . Hypertension   . Syncope     a. 01/2012 - presumed to be orthostatic in nature.     Past Surgical History  Procedure Laterality Date  . Vasectomy    . Vasectomy reversal    . Appendectomy    . Cataract extraction, bilateral    . Coronary artery bypass graft  02/06/2012    Procedure: CORONARY ARTERY BYPASS GRAFTING (CABG);  Surgeon: Alleen Borne,  MD;  Location: [facility] OR;  Service: Open Heart Surgery;  Laterality: N/A;  CABG x three,  using left internal mammary artery and right leg greater saphenous vein harvested endoscopically  . Aortic valve replacement  02/06/2012    Procedure: AORTIC VALVE REPLACEMENT (AVR);  Surgeon: Alleen Borne, MD;  Location: [facility] OR;  Service: Open Heart Surgery;  Laterality: N/A;  . Cardiac catheterization  01/23/2012    [facility]     Family History  Problem Relation Age of Onset  . Heart disease Mother   . Heart disease Brother      History   Social History  . Marital Status: Married    Spouse Name: N/A    Number of Children: N/A  . Years of Education: N/A   Occupational History  . Not on file.   Social History Main Topics  . Smoking status: Former Smoker -- 3.00 packs/day for 9 years    Types: Cigarettes    Quit date: 02/02/1978  . Smokeless tobacco: Never Used  . Alcohol Use:  Yes     Comment: SOCIAL  . Drug Use: No  . Sexually Active: Not on file   Other Topics Concern  . Not on file   Social History Narrative  . No narrative on file     PHYSICAL EXAM   BP 160/80  Pulse 76  Ht 6' (1.829 m)  Wt 271 lb (122.925 kg)  BMI 36.75 kg/m2 Constitutional: He is oriented to person, place, and time. He appears well-developed and well-nourished. No distress.  HENT: No nasal discharge.  Head: Normocephalic and atraumatic.  Eyes: Pupils are equal and round. Right eye exhibits no discharge. Left eye exhibits no discharge.  Neck: Normal range of motion. Neck supple. No JVD present. No thyromegaly present.  Cardiovascular: Normal rate, regular rhythm, normal mechanical heart sounds and. Exam reveals no gallop and no friction rub. No murmur heard.  Pulmonary/Chest: Effort normal and breath sounds normal. No stridor. No respiratory distress. He has no wheezes. He has no rales. He exhibits no tenderness.  Abdominal: Soft. Bowel sounds are normal. He exhibits no distension. There is no tenderness. There is no rebound and no guarding.  Musculoskeletal: Normal range of motion. He exhibits +1 edema worse on the right side and no tenderness.  Neurological: He is alert and oriented to person, place, and time. Coordination normal.  Skin: Skin is warm and dry. No rash noted. He is not diaphoretic. No erythema. No pallor.  Psychiatric: He has a normal mood and affect. His behavior is normal. Judgment and thought content normal.       ASSESSMENT AND PLAN

## 2012-05-24 NOTE — Assessment & Plan Note (Signed)
He has lower extremity edema likely due to chronic venous insufficiency. I advised him to start using support stockings during the day.

## 2012-05-24 NOTE — Assessment & Plan Note (Signed)
His blood pressure is elevated today that has improved recently after the addition of hydrochlorothiazide. I asked him to monitor his blood pressure over the next week or 2. If it remains elevated, I will consider adding spironolactone. Amlodipine is also an option but I would like to avoid that due to his lower extremity edema.

## 2012-06-02 ENCOUNTER — Telehealth: Payer: Self-pay

## 2012-06-02 NOTE — Telephone Encounter (Signed)
lmtcb

## 2012-06-02 NOTE — Telephone Encounter (Signed)
Pt informed

## 2012-06-02 NOTE — Telephone Encounter (Signed)
Pt dropped off BP readings for Dr. Kirke Corin to review Per Dr. Kirke Corin, BP is improving. I will call to notify pt

## 2012-06-11 ENCOUNTER — Ambulatory Visit (INDEPENDENT_AMBULATORY_CARE_PROVIDER_SITE_OTHER): Payer: 59

## 2012-06-11 DIAGNOSIS — Z952 Presence of prosthetic heart valve: Secondary | ICD-10-CM

## 2012-06-11 DIAGNOSIS — Z954 Presence of other heart-valve replacement: Secondary | ICD-10-CM

## 2012-07-02 ENCOUNTER — Ambulatory Visit (INDEPENDENT_AMBULATORY_CARE_PROVIDER_SITE_OTHER): Payer: 59

## 2012-07-02 DIAGNOSIS — Z954 Presence of other heart-valve replacement: Secondary | ICD-10-CM

## 2012-07-02 DIAGNOSIS — E785 Hyperlipidemia, unspecified: Secondary | ICD-10-CM

## 2012-07-02 DIAGNOSIS — Z952 Presence of prosthetic heart valve: Secondary | ICD-10-CM

## 2012-07-02 LAB — POCT INR: INR: 2.3

## 2012-07-03 LAB — LIPID PANEL
Cholesterol, Total: 161 mg/dL (ref 100–199)
HDL: 37 mg/dL — ABNORMAL LOW (ref >39)
LDL Calculated: 101 mg/dL — ABNORMAL HIGH (ref 0–99)
VLDL Cholesterol Cal: 23 mg/dL (ref 5–40)

## 2012-07-03 LAB — HEPATIC FUNCTION PANEL
AST: 17 IU/L (ref 0–40)
Alkaline Phosphatase: 50 IU/L (ref 39–117)
Total Protein: 7 g/dL (ref 6.0–8.5)

## 2012-07-05 ENCOUNTER — Other Ambulatory Visit: Payer: Self-pay

## 2012-07-05 MED ORDER — ATORVASTATIN CALCIUM 80 MG PO TABS: 80.0000 mg | ORAL_TABLET | Freq: Every day | ORAL | Status: DC

## 2012-07-29 ENCOUNTER — Other Ambulatory Visit: Payer: Self-pay | Admitting: Surgery

## 2012-07-30 ENCOUNTER — Other Ambulatory Visit: Payer: Self-pay | Admitting: Cardiovascular Disease

## 2012-08-04 ENCOUNTER — Ambulatory Visit (INDEPENDENT_AMBULATORY_CARE_PROVIDER_SITE_OTHER): Payer: 59

## 2012-08-04 DIAGNOSIS — Z954 Presence of other heart-valve replacement: Secondary | ICD-10-CM

## 2012-08-04 DIAGNOSIS — Z952 Presence of prosthetic heart valve: Secondary | ICD-10-CM

## 2012-08-04 LAB — POCT INR: INR: 1.5

## 2012-08-25 ENCOUNTER — Ambulatory Visit (INDEPENDENT_AMBULATORY_CARE_PROVIDER_SITE_OTHER): Payer: 59

## 2012-08-25 DIAGNOSIS — Z952 Presence of prosthetic heart valve: Secondary | ICD-10-CM

## 2012-08-25 DIAGNOSIS — Z954 Presence of other heart-valve replacement: Secondary | ICD-10-CM

## 2012-08-25 LAB — POCT INR: INR: 1.4

## 2012-09-08 ENCOUNTER — Ambulatory Visit (INDEPENDENT_AMBULATORY_CARE_PROVIDER_SITE_OTHER): Payer: 59

## 2012-09-08 DIAGNOSIS — Z952 Presence of prosthetic heart valve: Secondary | ICD-10-CM

## 2012-09-08 DIAGNOSIS — Z954 Presence of other heart-valve replacement: Secondary | ICD-10-CM

## 2012-09-08 LAB — POCT INR: INR: 1.6

## 2012-09-22 ENCOUNTER — Ambulatory Visit (INDEPENDENT_AMBULATORY_CARE_PROVIDER_SITE_OTHER): Payer: 59

## 2012-09-22 DIAGNOSIS — Z952 Presence of prosthetic heart valve: Secondary | ICD-10-CM

## 2012-09-22 DIAGNOSIS — Z954 Presence of other heart-valve replacement: Secondary | ICD-10-CM

## 2012-09-22 LAB — POCT INR: INR: 3.4

## 2012-09-24 ENCOUNTER — Other Ambulatory Visit: Payer: Self-pay | Admitting: Cardiovascular Disease

## 2012-10-06 ENCOUNTER — Ambulatory Visit (INDEPENDENT_AMBULATORY_CARE_PROVIDER_SITE_OTHER): Payer: 59 | Admitting: *Deleted

## 2012-10-06 DIAGNOSIS — Z952 Presence of prosthetic heart valve: Secondary | ICD-10-CM

## 2012-10-06 DIAGNOSIS — Z954 Presence of other heart-valve replacement: Secondary | ICD-10-CM

## 2012-10-06 LAB — POCT INR: INR: 2.8

## 2012-10-20 ENCOUNTER — Ambulatory Visit (INDEPENDENT_AMBULATORY_CARE_PROVIDER_SITE_OTHER): Payer: 59

## 2012-10-20 DIAGNOSIS — Z954 Presence of other heart-valve replacement: Secondary | ICD-10-CM

## 2012-10-20 DIAGNOSIS — Z952 Presence of prosthetic heart valve: Secondary | ICD-10-CM

## 2012-10-20 LAB — POCT INR: INR: 2.8

## 2012-11-10 ENCOUNTER — Ambulatory Visit (INDEPENDENT_AMBULATORY_CARE_PROVIDER_SITE_OTHER): Payer: 59

## 2012-11-10 DIAGNOSIS — Z952 Presence of prosthetic heart valve: Secondary | ICD-10-CM

## 2012-11-10 DIAGNOSIS — Z954 Presence of other heart-valve replacement: Secondary | ICD-10-CM

## 2012-11-10 LAB — POCT INR: INR: 3.4

## 2012-12-01 ENCOUNTER — Ambulatory Visit (INDEPENDENT_AMBULATORY_CARE_PROVIDER_SITE_OTHER): Payer: 59 | Admitting: General Practice

## 2012-12-01 DIAGNOSIS — Z952 Presence of prosthetic heart valve: Secondary | ICD-10-CM

## 2012-12-01 DIAGNOSIS — Z954 Presence of other heart-valve replacement: Secondary | ICD-10-CM

## 2012-12-08 ENCOUNTER — Other Ambulatory Visit: Payer: Self-pay | Admitting: Cardiovascular Disease

## 2012-12-08 NOTE — Telephone Encounter (Signed)
Please review and Refill, Thank You.

## 2012-12-23 ENCOUNTER — Other Ambulatory Visit: Payer: Self-pay

## 2012-12-29 ENCOUNTER — Ambulatory Visit (INDEPENDENT_AMBULATORY_CARE_PROVIDER_SITE_OTHER): Payer: 59 | Admitting: *Deleted

## 2012-12-29 DIAGNOSIS — Z954 Presence of other heart-valve replacement: Secondary | ICD-10-CM

## 2012-12-29 DIAGNOSIS — Z952 Presence of prosthetic heart valve: Secondary | ICD-10-CM

## 2013-01-12 ENCOUNTER — Ambulatory Visit (INDEPENDENT_AMBULATORY_CARE_PROVIDER_SITE_OTHER): Payer: 59 | Admitting: General Practice

## 2013-01-12 DIAGNOSIS — Z952 Presence of prosthetic heart valve: Secondary | ICD-10-CM

## 2013-01-12 DIAGNOSIS — Z954 Presence of other heart-valve replacement: Secondary | ICD-10-CM

## 2013-02-02 ENCOUNTER — Ambulatory Visit (INDEPENDENT_AMBULATORY_CARE_PROVIDER_SITE_OTHER): Payer: 59

## 2013-02-02 DIAGNOSIS — Z954 Presence of other heart-valve replacement: Secondary | ICD-10-CM

## 2013-02-02 DIAGNOSIS — Z952 Presence of prosthetic heart valve: Secondary | ICD-10-CM

## 2013-02-02 LAB — POCT INR: INR: 2.4

## 2013-03-02 ENCOUNTER — Ambulatory Visit (INDEPENDENT_AMBULATORY_CARE_PROVIDER_SITE_OTHER): Payer: 59

## 2013-03-02 DIAGNOSIS — Z954 Presence of other heart-valve replacement: Secondary | ICD-10-CM

## 2013-03-02 DIAGNOSIS — Z952 Presence of prosthetic heart valve: Secondary | ICD-10-CM

## 2013-03-02 LAB — POCT INR: INR: 3.7

## 2013-03-23 ENCOUNTER — Ambulatory Visit (INDEPENDENT_AMBULATORY_CARE_PROVIDER_SITE_OTHER): Payer: 59 | Admitting: *Deleted

## 2013-03-23 DIAGNOSIS — Z954 Presence of other heart-valve replacement: Secondary | ICD-10-CM

## 2013-03-23 DIAGNOSIS — Z5181 Encounter for therapeutic drug level monitoring: Secondary | ICD-10-CM

## 2013-03-23 DIAGNOSIS — Z952 Presence of prosthetic heart valve: Secondary | ICD-10-CM

## 2013-03-23 LAB — POCT INR: INR: 1.6

## 2013-04-08 ENCOUNTER — Ambulatory Visit (INDEPENDENT_AMBULATORY_CARE_PROVIDER_SITE_OTHER): Payer: 59

## 2013-04-08 DIAGNOSIS — Z954 Presence of other heart-valve replacement: Secondary | ICD-10-CM

## 2013-04-08 DIAGNOSIS — Z952 Presence of prosthetic heart valve: Secondary | ICD-10-CM

## 2013-04-08 DIAGNOSIS — Z5181 Encounter for therapeutic drug level monitoring: Secondary | ICD-10-CM

## 2013-04-08 LAB — POCT INR: INR: 1.1

## 2013-04-27 ENCOUNTER — Ambulatory Visit (INDEPENDENT_AMBULATORY_CARE_PROVIDER_SITE_OTHER): Payer: 59

## 2013-04-27 DIAGNOSIS — Z954 Presence of other heart-valve replacement: Secondary | ICD-10-CM

## 2013-04-27 DIAGNOSIS — Z5181 Encounter for therapeutic drug level monitoring: Secondary | ICD-10-CM

## 2013-04-27 DIAGNOSIS — Z952 Presence of prosthetic heart valve: Secondary | ICD-10-CM

## 2013-04-27 LAB — POCT INR: INR: 2.9

## 2013-05-11 ENCOUNTER — Ambulatory Visit (INDEPENDENT_AMBULATORY_CARE_PROVIDER_SITE_OTHER): Payer: 59

## 2013-05-11 DIAGNOSIS — Z952 Presence of prosthetic heart valve: Secondary | ICD-10-CM

## 2013-05-11 DIAGNOSIS — Z5181 Encounter for therapeutic drug level monitoring: Secondary | ICD-10-CM

## 2013-05-11 DIAGNOSIS — Z954 Presence of other heart-valve replacement: Secondary | ICD-10-CM

## 2013-05-11 LAB — POCT INR: INR: 1.7

## 2013-05-17 ENCOUNTER — Other Ambulatory Visit: Payer: Self-pay | Admitting: *Deleted

## 2013-05-17 MED ORDER — WARFARIN SODIUM 5 MG PO TABS: ORAL_TABLET | ORAL | Status: DC

## 2013-05-25 ENCOUNTER — Ambulatory Visit (INDEPENDENT_AMBULATORY_CARE_PROVIDER_SITE_OTHER): Payer: 59

## 2013-05-25 DIAGNOSIS — Z954 Presence of other heart-valve replacement: Secondary | ICD-10-CM

## 2013-05-25 DIAGNOSIS — Z5181 Encounter for therapeutic drug level monitoring: Secondary | ICD-10-CM

## 2013-05-25 DIAGNOSIS — Z952 Presence of prosthetic heart valve: Secondary | ICD-10-CM

## 2013-05-25 LAB — POCT INR: INR: 4.3

## 2013-06-08 ENCOUNTER — Ambulatory Visit (INDEPENDENT_AMBULATORY_CARE_PROVIDER_SITE_OTHER): Payer: 59

## 2013-06-08 DIAGNOSIS — Z954 Presence of other heart-valve replacement: Secondary | ICD-10-CM

## 2013-06-08 DIAGNOSIS — Z5181 Encounter for therapeutic drug level monitoring: Secondary | ICD-10-CM

## 2013-06-08 DIAGNOSIS — Z952 Presence of prosthetic heart valve: Secondary | ICD-10-CM

## 2013-06-08 LAB — POCT INR: INR: 2.2

## 2013-06-29 ENCOUNTER — Ambulatory Visit (INDEPENDENT_AMBULATORY_CARE_PROVIDER_SITE_OTHER): Payer: 59

## 2013-06-29 DIAGNOSIS — Z954 Presence of other heart-valve replacement: Secondary | ICD-10-CM

## 2013-06-29 DIAGNOSIS — Z5181 Encounter for therapeutic drug level monitoring: Secondary | ICD-10-CM

## 2013-06-29 DIAGNOSIS — Z952 Presence of prosthetic heart valve: Secondary | ICD-10-CM

## 2013-06-29 LAB — POCT INR: INR: 2

## 2013-07-23 ENCOUNTER — Other Ambulatory Visit: Payer: Self-pay | Admitting: Cardiovascular Disease

## 2013-07-27 ENCOUNTER — Ambulatory Visit (INDEPENDENT_AMBULATORY_CARE_PROVIDER_SITE_OTHER): Payer: 59

## 2013-07-27 DIAGNOSIS — Z952 Presence of prosthetic heart valve: Secondary | ICD-10-CM

## 2013-07-27 DIAGNOSIS — Z5181 Encounter for therapeutic drug level monitoring: Secondary | ICD-10-CM

## 2013-07-27 DIAGNOSIS — Z954 Presence of other heart-valve replacement: Secondary | ICD-10-CM

## 2013-07-27 LAB — POCT INR: INR: 2.4

## 2013-09-07 ENCOUNTER — Ambulatory Visit (INDEPENDENT_AMBULATORY_CARE_PROVIDER_SITE_OTHER): Payer: 59

## 2013-09-07 DIAGNOSIS — Z952 Presence of prosthetic heart valve: Secondary | ICD-10-CM

## 2013-09-07 DIAGNOSIS — Z5181 Encounter for therapeutic drug level monitoring: Secondary | ICD-10-CM

## 2013-09-07 DIAGNOSIS — Z954 Presence of other heart-valve replacement: Secondary | ICD-10-CM

## 2013-09-07 LAB — POCT INR: INR: 3.5

## 2013-09-28 ENCOUNTER — Ambulatory Visit (INDEPENDENT_AMBULATORY_CARE_PROVIDER_SITE_OTHER): Payer: 59 | Admitting: *Deleted

## 2013-09-28 DIAGNOSIS — Z954 Presence of other heart-valve replacement: Secondary | ICD-10-CM

## 2013-09-28 DIAGNOSIS — Z5181 Encounter for therapeutic drug level monitoring: Secondary | ICD-10-CM

## 2013-09-28 DIAGNOSIS — Z952 Presence of prosthetic heart valve: Secondary | ICD-10-CM

## 2013-09-28 LAB — POCT INR: INR: 2.1

## 2013-10-02 ENCOUNTER — Other Ambulatory Visit: Payer: Self-pay | Admitting: Cardiovascular Disease

## 2013-10-03 NOTE — Telephone Encounter (Signed)
Please review for refill, Thanks !  

## 2013-10-26 ENCOUNTER — Ambulatory Visit (INDEPENDENT_AMBULATORY_CARE_PROVIDER_SITE_OTHER): Payer: 59

## 2013-10-26 DIAGNOSIS — Z954 Presence of other heart-valve replacement: Secondary | ICD-10-CM

## 2013-10-26 DIAGNOSIS — Z952 Presence of prosthetic heart valve: Secondary | ICD-10-CM

## 2013-10-26 DIAGNOSIS — Z5181 Encounter for therapeutic drug level monitoring: Secondary | ICD-10-CM

## 2013-10-26 LAB — POCT INR: INR: 2.9

## 2013-11-23 ENCOUNTER — Ambulatory Visit (INDEPENDENT_AMBULATORY_CARE_PROVIDER_SITE_OTHER): Payer: 59

## 2013-11-23 DIAGNOSIS — Z954 Presence of other heart-valve replacement: Secondary | ICD-10-CM

## 2013-11-23 DIAGNOSIS — Z5181 Encounter for therapeutic drug level monitoring: Secondary | ICD-10-CM

## 2013-11-23 DIAGNOSIS — Z952 Presence of prosthetic heart valve: Secondary | ICD-10-CM

## 2013-11-23 LAB — POCT INR: INR: 5.1

## 2013-11-30 ENCOUNTER — Ambulatory Visit (INDEPENDENT_AMBULATORY_CARE_PROVIDER_SITE_OTHER): Payer: 59

## 2013-11-30 DIAGNOSIS — Z954 Presence of other heart-valve replacement: Secondary | ICD-10-CM

## 2013-11-30 DIAGNOSIS — Z5181 Encounter for therapeutic drug level monitoring: Secondary | ICD-10-CM

## 2013-11-30 DIAGNOSIS — Z952 Presence of prosthetic heart valve: Secondary | ICD-10-CM

## 2013-11-30 LAB — POCT INR: INR: 3.4

## 2013-12-14 ENCOUNTER — Ambulatory Visit (INDEPENDENT_AMBULATORY_CARE_PROVIDER_SITE_OTHER): Payer: 59

## 2013-12-14 DIAGNOSIS — Z954 Presence of other heart-valve replacement: Secondary | ICD-10-CM

## 2013-12-14 DIAGNOSIS — Z952 Presence of prosthetic heart valve: Secondary | ICD-10-CM

## 2013-12-14 DIAGNOSIS — Z5181 Encounter for therapeutic drug level monitoring: Secondary | ICD-10-CM

## 2013-12-14 LAB — POCT INR: INR: 2.8

## 2013-12-22 ENCOUNTER — Telehealth: Payer: Self-pay | Admitting: *Deleted

## 2013-12-22 NOTE — Telephone Encounter (Signed)
LVM to schedule f/u appt with Dr. Kirke CorinArida  Faxed colonoscopy cardiac clearance

## 2013-12-26 NOTE — Telephone Encounter (Signed)
Appt scheduled with Dr. Kirke CorinArida Patient aware

## 2014-01-03 ENCOUNTER — Encounter: Payer: Self-pay | Admitting: Cardiovascular Disease

## 2014-01-03 ENCOUNTER — Ambulatory Visit (INDEPENDENT_AMBULATORY_CARE_PROVIDER_SITE_OTHER): Payer: 59 | Admitting: Cardiovascular Disease

## 2014-01-03 VITALS — BP 140/76 | HR 82 | Ht 73.0 in | Wt 263.4 lb

## 2014-01-03 DIAGNOSIS — I359 Nonrheumatic aortic valve disorder, unspecified: Secondary | ICD-10-CM

## 2014-01-03 DIAGNOSIS — I10 Essential (primary) hypertension: Secondary | ICD-10-CM

## 2014-01-03 DIAGNOSIS — I209 Angina pectoris, unspecified: Secondary | ICD-10-CM

## 2014-01-03 DIAGNOSIS — I208 Other forms of angina pectoris: Secondary | ICD-10-CM

## 2014-01-03 DIAGNOSIS — E785 Hyperlipidemia, unspecified: Secondary | ICD-10-CM

## 2014-01-03 DIAGNOSIS — Z952 Presence of prosthetic heart valve: Secondary | ICD-10-CM

## 2014-01-03 DIAGNOSIS — Z954 Presence of other heart-valve replacement: Secondary | ICD-10-CM

## 2014-01-03 DIAGNOSIS — I2510 Atherosclerotic heart disease of native coronary artery without angina pectoris: Secondary | ICD-10-CM

## 2014-01-03 NOTE — Patient Instructions (Signed)
Your physician has requested that you have an echocardiogram. Echocardiography is a painless test that uses sound waves to create images of your heart. It provides your doctor with information about the size and shape of your heart and how well your heart's chambers and valves are working. This procedure takes approximately one hour. There are no restrictions for this procedure.   Your physician wants you to follow-up in: 1 year with Dr. Arida. You will receive a reminder letter in the mail two months in advance. If you don't receive a letter, please call our office to schedule the follow-up appointment.  

## 2014-01-03 NOTE — Progress Notes (Signed)
Primary care physician: Dr. Beverely RisenFozia Khan  HPI  This is a 63 year old male who is here today for a followup visit regarding coronary artery disease and aortic valve replacement. He is status post coronary artery bypass grafting x3 as well as placement of a St. Jude mechanical aortic valve in December of 2013. He has been on Coumadin since. He is known to have hypertension and hyperlipidemia. He has been having red blood per rectum and is scheduled to have colonoscopy. He denies any chest discomfort. He does complain of some worsening of exertional dyspnea with no orthopnea or PND. he continues to be active.    Allergies  Allergen Reactions  . Ivp Dye [Iodinated Diagnostic Agents] Itching    Can tolerate if takes benadryl     Current Outpatient Prescriptions on File Prior to Visit  Medication Sig Dispense Refill  . amitriptyline (ELAVIL) 25 MG tablet Take 25 mg by mouth at bedtime.     Marland Kitchen. amoxicillin (AMOXIL) 500 MG tablet as needed. 2 Grams 30-60 mins prior to procedure    . aspirin EC 81 MG tablet Take 81 mg by mouth daily.    Marland Kitchen. atorvastatin (LIPITOR) 80 MG tablet TAKE 1 TABLET DAILY. 90 tablet 3  . Canagliflozin (INVOKANA) 100 MG TABS Take 100 mg by mouth daily.    . carvedilol (COREG) 25 MG tablet Take 25 mg by mouth 2 (two) times daily with a meal.    . gabapentin (NEURONTIN) 100 MG capsule Take 100 mg by mouth at bedtime.    Marland Kitchen. glimepiride (AMARYL) 4 MG tablet Take 4 mg by mouth 2 (two) times daily with a meal.     . NON FORMULARY CPAP to be used nightly with oxygen @ 1L.    Marland Kitchen. Phendimetrazine Tartrate 105 MG CP24 Take 1 capsule by mouth daily.    . Saxagliptin-Metformin (KOMBIGLYZE XR) 2.06-998 MG TB24 Take 1 tablet by mouth 2 (two) times daily with a meal.     . warfarin (COUMADIN) 5 MG tablet TAKE AS DIRECTED BY ANTICOAGULATION CLINIC 30 tablet 3   No current facility-administered medications on file prior to visit.     Past Medical History  Diagnosis Date  . Arthritis   .  Hyperlipidemia   . Nephrolithiasis   . Type II diabetes mellitus   . Aortic stenosis     a. 01/2012 s/p AVR with SJM 23mm mechanical valve.  . Obesity   . Coronary artery disease     a. 01/2012 s/p CABG x 3 (LIMA->LAD, VG->OM1,VG->PDA)  . Sleep apnea     a. on CPAP  . Hypertension   . Syncope     a. 01/2012 - presumed to be orthostatic in nature.  . Chronic venous insufficiency 05/24/2012     Past Surgical History  Procedure Laterality Date  . Vasectomy    . Vasectomy reversal    . Appendectomy    . Cataract extraction, bilateral    . Coronary artery bypass graft  02/06/2012    Procedure: CORONARY ARTERY BYPASS GRAFTING (CABG);  Surgeon: Alleen BorneBryan K Bartle, MD;  [facility];  Service: Open Heart Surgery;  Laterality: N/A;  CABG x three,  using left internal mammary artery and right leg greater saphenous vein harvested endoscopically  . Aortic valve replacement  02/06/2012    Procedure: AORTIC VALVE REPLACEMENT (AVR);  Surgeon: Alleen BorneBryan K Bartle, MD;  [facility];  Service: Open Heart Surgery;  Laterality: N/A;  . [facility]  [facility]  Family History  Problem Relation Age of Onset  . Heart disease Mother   . Heart disease Brother      History   Social History  . Marital Status: Married    Spouse Name: N/A    Number of Children: N/A  . Years of Education: N/A   Occupational History  . Not on file.   Social History Main Topics  . Smoking status: Former Smoker -- 3.00 packs/day for 9 years    Types: Cigarettes    Quit date: 02/02/1978  . Smokeless tobacco: Never Used  . Alcohol Use: Yes     Comment: SOCIAL  . Drug Use: No  . Sexual Activity: Not on file   Other Topics Concern  . Not on file   Social History Narrative     PHYSICAL EXAM   BP 140/76 mmHg  Pulse 82  Ht 6\' 1"  (1.854 m)  Wt 263 lb 6.4 oz (119.477 kg)  BMI 34.76 kg/m2 Constitutional: He is oriented to person, place, and time. He appears well-developed and  well-nourished. No distress.  HENT: No nasal discharge.  Head: Normocephalic and atraumatic.  Eyes: Pupils are equal and round. Right eye exhibits no discharge. Left eye exhibits no discharge.  Neck: Normal range of motion. Neck supple. No JVD present. No thyromegaly present.  Cardiovascular: Normal rate, regular rhythm, normal mechanical heart sounds and. Exam reveals no gallop and no friction rub. No murmur heard.  Pulmonary/Chest: Effort normal and breath sounds normal. No stridor. No respiratory distress. He has no wheezes. He has no rales. He exhibits no tenderness.  Abdominal: Soft. Bowel sounds are normal. He exhibits no distension. There is no tenderness. There is no rebound and no guarding.  Musculoskeletal: Normal range of motion. He exhibits +1 edema worse on the right side and no tenderness.  Neurological: He is alert and oriented to person, place, and time. Coordination normal.  Skin: Skin is warm and dry. No rash noted. He is not diaphoretic. No erythema. No pallor.  Psychiatric: He has a normal mood and affect. His behavior is normal. Judgment and thought content normal.     EKG: Normal sinus rhythm with right bundle branch block, left ventricular hypertrophy with repolarization abnormalities  ASSESSMENT AND PLAN

## 2014-01-04 ENCOUNTER — Telehealth: Payer: Self-pay | Admitting: *Deleted

## 2014-01-04 NOTE — Assessment & Plan Note (Signed)
Blood pressure is well controlled on current medications. 

## 2014-01-04 NOTE — Assessment & Plan Note (Signed)
Continue treatment with atorvastatin with a target LDL of less than 70. 

## 2014-01-04 NOTE — Telephone Encounter (Signed)
Per Arline Aspindy at Dr. Shelah LewandowskyElliots office patient has appointment Friday 01/06/14 to set up Lovenox bridge for upcoming coloscopy  Patient aware

## 2014-01-04 NOTE — Assessment & Plan Note (Signed)
He is doing reasonably well with no clear symptoms of angina. Continue medical therapy.

## 2014-01-04 NOTE — Assessment & Plan Note (Signed)
Continued of long anticoagulation with warfarin. Given increased exertional dyspnea, I requested an echocardiogram to evaluate the mechanical aortic valve. He is at low risk for colonoscopy. However, he will need to be bridged with low molecular weight heparin.

## 2014-01-11 ENCOUNTER — Ambulatory Visit (INDEPENDENT_AMBULATORY_CARE_PROVIDER_SITE_OTHER): Payer: 59

## 2014-01-11 DIAGNOSIS — Z952 Presence of prosthetic heart valve: Secondary | ICD-10-CM

## 2014-01-11 DIAGNOSIS — Z5181 Encounter for therapeutic drug level monitoring: Secondary | ICD-10-CM

## 2014-01-11 DIAGNOSIS — Z954 Presence of other heart-valve replacement: Secondary | ICD-10-CM

## 2014-01-11 LAB — POCT INR: INR: 2.9

## 2014-01-18 IMAGING — CR DG CHEST 1V PORT
1 series · 1 of 1 positions shown · non-contrast
Comparison: None.

CLINICAL DATA: Postoperative

PORTABLE CHEST - 1 VIEW

[AP]
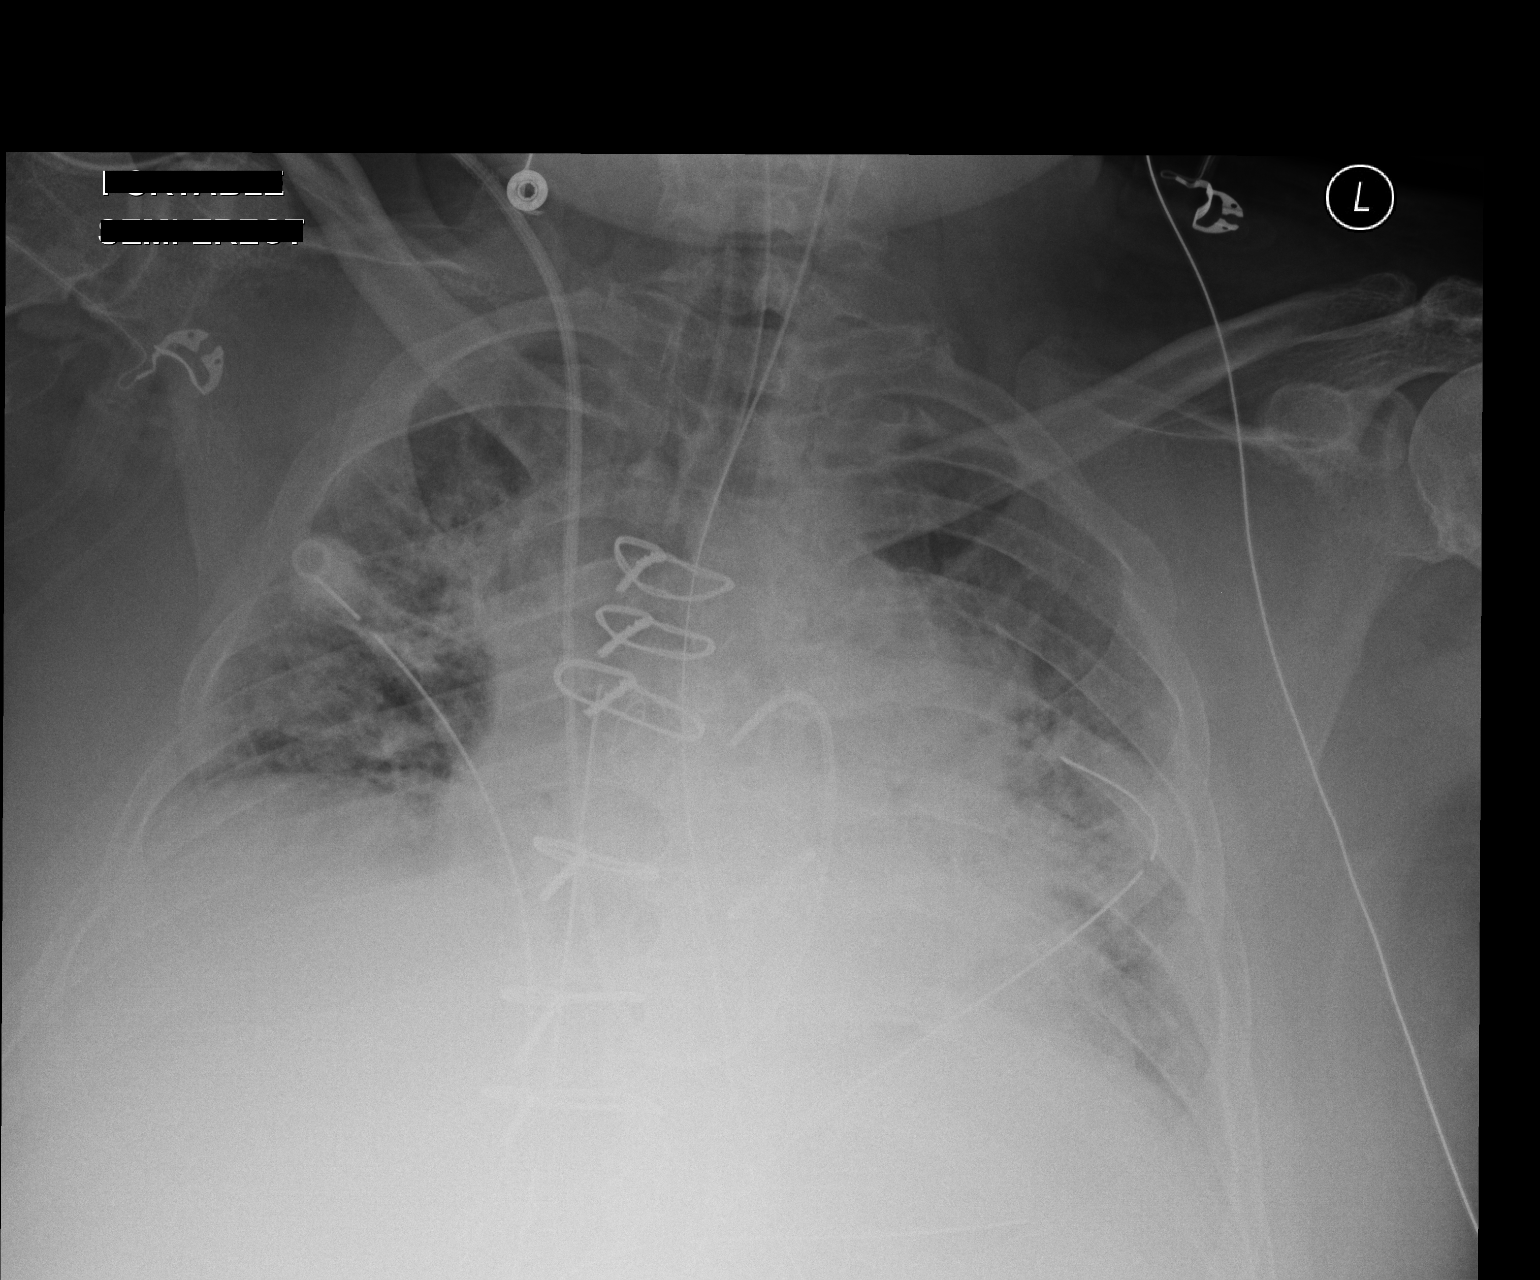

[1 of 1 positions shown; findings below may reference images not displayed]

FINDINGS: Endotracheal tube tip is 2.3 cm from the carina.  NG tube
tip is beyond the gastroesophageal junction.  Right internal
jugular vein introducer sheath and Swan-Ganz catheter tip in the
central right pulmonary artery.  Bilateral chest and mediastinal
tubes in place without pneumothorax.  Lungs markedly under aerated
with scattered atelectasis.  Aortic valve replacement hardware in
place.
IMPRESSION: Postop thoracic surgery.  Very low volumes, scattered atelectasis,
and no pneumothorax.  Tubular structures appropriately positioned.

## 2014-01-26 ENCOUNTER — Other Ambulatory Visit: Payer: Self-pay

## 2014-01-26 ENCOUNTER — Other Ambulatory Visit (INDEPENDENT_AMBULATORY_CARE_PROVIDER_SITE_OTHER): Payer: 59

## 2014-01-26 DIAGNOSIS — I359 Nonrheumatic aortic valve disorder, unspecified: Secondary | ICD-10-CM

## 2014-01-26 DIAGNOSIS — Z954 Presence of other heart-valve replacement: Secondary | ICD-10-CM

## 2014-01-26 DIAGNOSIS — I2510 Atherosclerotic heart disease of native coronary artery without angina pectoris: Secondary | ICD-10-CM

## 2014-01-27 ENCOUNTER — Other Ambulatory Visit: Payer: 59

## 2014-02-03 ENCOUNTER — Ambulatory Visit: Payer: Self-pay | Admitting: Unknown Physician Specialty

## 2014-02-15 ENCOUNTER — Ambulatory Visit (INDEPENDENT_AMBULATORY_CARE_PROVIDER_SITE_OTHER): Payer: 59

## 2014-02-15 DIAGNOSIS — Z954 Presence of other heart-valve replacement: Secondary | ICD-10-CM

## 2014-02-15 DIAGNOSIS — Z5181 Encounter for therapeutic drug level monitoring: Secondary | ICD-10-CM

## 2014-02-15 DIAGNOSIS — Z952 Presence of prosthetic heart valve: Secondary | ICD-10-CM

## 2014-02-15 LAB — POCT INR: INR: 1.8

## 2014-03-08 ENCOUNTER — Ambulatory Visit (INDEPENDENT_AMBULATORY_CARE_PROVIDER_SITE_OTHER): Payer: 59

## 2014-03-08 DIAGNOSIS — Z952 Presence of prosthetic heart valve: Secondary | ICD-10-CM

## 2014-03-08 DIAGNOSIS — Z954 Presence of other heart-valve replacement: Secondary | ICD-10-CM

## 2014-03-08 DIAGNOSIS — Z5181 Encounter for therapeutic drug level monitoring: Secondary | ICD-10-CM

## 2014-03-08 LAB — POCT INR: INR: 2.9

## 2014-04-05 ENCOUNTER — Ambulatory Visit (INDEPENDENT_AMBULATORY_CARE_PROVIDER_SITE_OTHER): Payer: 59

## 2014-04-05 DIAGNOSIS — Z952 Presence of prosthetic heart valve: Secondary | ICD-10-CM

## 2014-04-05 DIAGNOSIS — Z954 Presence of other heart-valve replacement: Secondary | ICD-10-CM

## 2014-04-05 DIAGNOSIS — Z5181 Encounter for therapeutic drug level monitoring: Secondary | ICD-10-CM

## 2014-04-05 LAB — POCT INR: INR: 1.9

## 2014-04-09 ENCOUNTER — Other Ambulatory Visit: Payer: Self-pay | Admitting: Cardiovascular Disease

## 2014-04-10 NOTE — Telephone Encounter (Signed)
Please review for refill, Thank you. 

## 2014-05-10 ENCOUNTER — Ambulatory Visit (INDEPENDENT_AMBULATORY_CARE_PROVIDER_SITE_OTHER): Payer: 59

## 2014-05-10 DIAGNOSIS — Z954 Presence of other heart-valve replacement: Secondary | ICD-10-CM | POA: Diagnosis not present

## 2014-05-10 DIAGNOSIS — Z952 Presence of prosthetic heart valve: Secondary | ICD-10-CM

## 2014-05-10 DIAGNOSIS — Z5181 Encounter for therapeutic drug level monitoring: Secondary | ICD-10-CM

## 2014-05-10 LAB — POCT INR: INR: 2.2

## 2014-05-10 MED ORDER — WARFARIN SODIUM 5 MG PO TABS: ORAL_TABLET | ORAL | Status: DC

## 2014-06-07 ENCOUNTER — Ambulatory Visit (INDEPENDENT_AMBULATORY_CARE_PROVIDER_SITE_OTHER): Payer: 59

## 2014-06-07 DIAGNOSIS — Z954 Presence of other heart-valve replacement: Secondary | ICD-10-CM | POA: Diagnosis not present

## 2014-06-07 DIAGNOSIS — Z5181 Encounter for therapeutic drug level monitoring: Secondary | ICD-10-CM

## 2014-06-07 DIAGNOSIS — Z952 Presence of prosthetic heart valve: Secondary | ICD-10-CM

## 2014-06-07 LAB — POCT INR: INR: 2.7

## 2014-06-12 LAB — SURGICAL PATHOLOGY

## 2014-07-19 ENCOUNTER — Ambulatory Visit (INDEPENDENT_AMBULATORY_CARE_PROVIDER_SITE_OTHER): Payer: 59

## 2014-07-19 DIAGNOSIS — Z954 Presence of other heart-valve replacement: Secondary | ICD-10-CM

## 2014-07-19 DIAGNOSIS — Z5181 Encounter for therapeutic drug level monitoring: Secondary | ICD-10-CM

## 2014-07-19 DIAGNOSIS — Z952 Presence of prosthetic heart valve: Secondary | ICD-10-CM

## 2014-07-19 LAB — POCT INR: INR: 3.4

## 2014-08-09 ENCOUNTER — Other Ambulatory Visit: Payer: Self-pay

## 2014-08-09 MED ORDER — ATORVASTATIN CALCIUM 80 MG PO TABS: 80.0000 mg | ORAL_TABLET | Freq: Every day | ORAL | Status: DC

## 2014-08-09 NOTE — Telephone Encounter (Signed)
Refill sent for atorvastatin. 

## 2014-08-16 ENCOUNTER — Ambulatory Visit (INDEPENDENT_AMBULATORY_CARE_PROVIDER_SITE_OTHER): Payer: 59

## 2014-08-16 DIAGNOSIS — Z5181 Encounter for therapeutic drug level monitoring: Secondary | ICD-10-CM | POA: Diagnosis not present

## 2014-08-16 DIAGNOSIS — Z952 Presence of prosthetic heart valve: Secondary | ICD-10-CM

## 2014-08-16 DIAGNOSIS — Z954 Presence of other heart-valve replacement: Secondary | ICD-10-CM

## 2014-08-16 LAB — POCT INR: INR: 2.3

## 2014-09-13 ENCOUNTER — Ambulatory Visit (INDEPENDENT_AMBULATORY_CARE_PROVIDER_SITE_OTHER): Payer: 59

## 2014-09-13 DIAGNOSIS — Z5181 Encounter for therapeutic drug level monitoring: Secondary | ICD-10-CM | POA: Diagnosis not present

## 2014-09-13 DIAGNOSIS — Z954 Presence of other heart-valve replacement: Secondary | ICD-10-CM | POA: Diagnosis not present

## 2014-09-13 DIAGNOSIS — Z952 Presence of prosthetic heart valve: Secondary | ICD-10-CM

## 2014-09-13 LAB — POCT INR: INR: 1.9

## 2014-10-11 ENCOUNTER — Ambulatory Visit (INDEPENDENT_AMBULATORY_CARE_PROVIDER_SITE_OTHER): Payer: 59 | Admitting: Pharmacist

## 2014-10-11 DIAGNOSIS — Z954 Presence of other heart-valve replacement: Secondary | ICD-10-CM

## 2014-10-11 DIAGNOSIS — Z5181 Encounter for therapeutic drug level monitoring: Secondary | ICD-10-CM | POA: Diagnosis not present

## 2014-10-11 DIAGNOSIS — Z952 Presence of prosthetic heart valve: Secondary | ICD-10-CM

## 2014-10-11 LAB — POCT INR: INR: 2.8

## 2014-11-08 ENCOUNTER — Ambulatory Visit (INDEPENDENT_AMBULATORY_CARE_PROVIDER_SITE_OTHER): Payer: 59

## 2014-11-08 DIAGNOSIS — Z952 Presence of prosthetic heart valve: Secondary | ICD-10-CM

## 2014-11-08 DIAGNOSIS — Z954 Presence of other heart-valve replacement: Secondary | ICD-10-CM | POA: Diagnosis not present

## 2014-11-08 DIAGNOSIS — Z5181 Encounter for therapeutic drug level monitoring: Secondary | ICD-10-CM

## 2014-11-08 LAB — POCT INR: INR: 4.1

## 2014-11-14 ENCOUNTER — Other Ambulatory Visit: Payer: Self-pay

## 2014-11-14 MED ORDER — WARFARIN SODIUM 5 MG PO TABS: ORAL_TABLET | ORAL | Status: DC

## 2014-11-22 ENCOUNTER — Ambulatory Visit (INDEPENDENT_AMBULATORY_CARE_PROVIDER_SITE_OTHER): Payer: 59

## 2014-11-22 DIAGNOSIS — Z5181 Encounter for therapeutic drug level monitoring: Secondary | ICD-10-CM | POA: Diagnosis not present

## 2014-11-22 DIAGNOSIS — Z954 Presence of other heart-valve replacement: Secondary | ICD-10-CM | POA: Diagnosis not present

## 2014-11-22 DIAGNOSIS — Z952 Presence of prosthetic heart valve: Secondary | ICD-10-CM

## 2014-11-22 LAB — POCT INR: INR: 2.6

## 2014-12-20 ENCOUNTER — Ambulatory Visit (INDEPENDENT_AMBULATORY_CARE_PROVIDER_SITE_OTHER): Payer: 59

## 2014-12-20 DIAGNOSIS — Z954 Presence of other heart-valve replacement: Secondary | ICD-10-CM | POA: Diagnosis not present

## 2014-12-20 DIAGNOSIS — Z5181 Encounter for therapeutic drug level monitoring: Secondary | ICD-10-CM | POA: Diagnosis not present

## 2014-12-20 DIAGNOSIS — Z952 Presence of prosthetic heart valve: Secondary | ICD-10-CM

## 2014-12-20 LAB — POCT INR: INR: 2.4

## 2014-12-29 ENCOUNTER — Ambulatory Visit
Admission: RE | Admit: 2014-12-29 | Discharge: 2014-12-29 | Disposition: A | Discharge status: Home / Self Care | Payer: 59 | Source: Ambulatory Visit | Attending: Physician Assistant | Admitting: Physician Assistant

## 2014-12-29 ENCOUNTER — Other Ambulatory Visit: Payer: Self-pay | Admitting: Physician Assistant

## 2014-12-29 DIAGNOSIS — R059 Cough, unspecified: Secondary | ICD-10-CM

## 2014-12-29 DIAGNOSIS — J8410 Pulmonary fibrosis, unspecified: Secondary | ICD-10-CM | POA: Diagnosis not present

## 2014-12-29 DIAGNOSIS — R0602 Shortness of breath: Secondary | ICD-10-CM | POA: Diagnosis present

## 2014-12-29 DIAGNOSIS — R05 Cough: Secondary | ICD-10-CM | POA: Insufficient documentation

## 2015-01-24 ENCOUNTER — Ambulatory Visit (INDEPENDENT_AMBULATORY_CARE_PROVIDER_SITE_OTHER): Payer: 59

## 2015-01-24 DIAGNOSIS — Z5181 Encounter for therapeutic drug level monitoring: Secondary | ICD-10-CM

## 2015-01-24 DIAGNOSIS — Z954 Presence of other heart-valve replacement: Secondary | ICD-10-CM | POA: Diagnosis not present

## 2015-01-24 DIAGNOSIS — Z952 Presence of prosthetic heart valve: Secondary | ICD-10-CM

## 2015-01-24 LAB — POCT INR: INR: 2.4

## 2015-03-07 ENCOUNTER — Ambulatory Visit (INDEPENDENT_AMBULATORY_CARE_PROVIDER_SITE_OTHER): Payer: 59 | Admitting: *Deleted

## 2015-03-07 DIAGNOSIS — Z5181 Encounter for therapeutic drug level monitoring: Secondary | ICD-10-CM | POA: Diagnosis not present

## 2015-03-07 DIAGNOSIS — Z952 Presence of prosthetic heart valve: Secondary | ICD-10-CM

## 2015-03-07 DIAGNOSIS — Z954 Presence of other heart-valve replacement: Secondary | ICD-10-CM

## 2015-03-07 LAB — POCT INR: INR: 2

## 2015-04-18 ENCOUNTER — Ambulatory Visit (INDEPENDENT_AMBULATORY_CARE_PROVIDER_SITE_OTHER): Payer: 59

## 2015-04-18 DIAGNOSIS — Z5181 Encounter for therapeutic drug level monitoring: Secondary | ICD-10-CM | POA: Diagnosis not present

## 2015-04-18 DIAGNOSIS — Z954 Presence of other heart-valve replacement: Secondary | ICD-10-CM | POA: Diagnosis not present

## 2015-04-18 DIAGNOSIS — Z952 Presence of prosthetic heart valve: Secondary | ICD-10-CM

## 2015-04-18 LAB — POCT INR: INR: 3.9

## 2015-05-09 ENCOUNTER — Other Ambulatory Visit: Payer: Self-pay | Admitting: *Deleted

## 2015-05-09 MED ORDER — WARFARIN SODIUM 5 MG PO TABS: ORAL_TABLET | ORAL | Status: DC

## 2015-05-16 ENCOUNTER — Ambulatory Visit (INDEPENDENT_AMBULATORY_CARE_PROVIDER_SITE_OTHER): Payer: 59

## 2015-05-16 DIAGNOSIS — Z952 Presence of prosthetic heart valve: Secondary | ICD-10-CM

## 2015-05-16 DIAGNOSIS — Z5181 Encounter for therapeutic drug level monitoring: Secondary | ICD-10-CM | POA: Diagnosis not present

## 2015-05-16 DIAGNOSIS — Z954 Presence of other heart-valve replacement: Secondary | ICD-10-CM | POA: Diagnosis not present

## 2015-05-16 LAB — POCT INR: INR: 2.7

## 2015-06-13 ENCOUNTER — Ambulatory Visit (INDEPENDENT_AMBULATORY_CARE_PROVIDER_SITE_OTHER): Payer: 59

## 2015-06-13 DIAGNOSIS — Z5181 Encounter for therapeutic drug level monitoring: Secondary | ICD-10-CM

## 2015-06-13 DIAGNOSIS — Z954 Presence of other heart-valve replacement: Secondary | ICD-10-CM | POA: Diagnosis not present

## 2015-06-13 DIAGNOSIS — Z952 Presence of prosthetic heart valve: Secondary | ICD-10-CM

## 2015-06-13 LAB — POCT INR: INR: 2.7

## 2015-07-18 ENCOUNTER — Ambulatory Visit (INDEPENDENT_AMBULATORY_CARE_PROVIDER_SITE_OTHER): Payer: 59

## 2015-07-18 DIAGNOSIS — Z954 Presence of other heart-valve replacement: Secondary | ICD-10-CM

## 2015-07-18 DIAGNOSIS — Z5181 Encounter for therapeutic drug level monitoring: Secondary | ICD-10-CM

## 2015-07-18 DIAGNOSIS — Z952 Presence of prosthetic heart valve: Secondary | ICD-10-CM

## 2015-07-18 LAB — POCT INR: INR: 2.9

## 2015-08-11 ENCOUNTER — Emergency Department: Payer: 59

## 2015-08-11 ENCOUNTER — Encounter: Payer: Self-pay | Admitting: Emergency Medicine

## 2015-08-11 ENCOUNTER — Observation Stay
Admission: EM | Admit: 2015-08-11 | Discharge: 2015-08-13 | DRG: 189 | Disposition: A | Discharge status: Home / Self Care | Payer: 59 | Attending: Internal Medicine | Admitting: Internal Medicine

## 2015-08-11 DIAGNOSIS — Z91041 Radiographic dye allergy status: Secondary | ICD-10-CM | POA: Diagnosis not present

## 2015-08-11 DIAGNOSIS — J441 Chronic obstructive pulmonary disease with (acute) exacerbation: Secondary | ICD-10-CM | POA: Diagnosis not present

## 2015-08-11 DIAGNOSIS — J9621 Acute and chronic respiratory failure with hypoxia: Principal | ICD-10-CM | POA: Diagnosis present

## 2015-08-11 DIAGNOSIS — Z87891 Personal history of nicotine dependence: Secondary | ICD-10-CM | POA: Diagnosis not present

## 2015-08-11 DIAGNOSIS — Z9989 Dependence on other enabling machines and devices: Secondary | ICD-10-CM

## 2015-08-11 DIAGNOSIS — E119 Type 2 diabetes mellitus without complications: Secondary | ICD-10-CM

## 2015-08-11 DIAGNOSIS — M1990 Unspecified osteoarthritis, unspecified site: Secondary | ICD-10-CM | POA: Diagnosis not present

## 2015-08-11 DIAGNOSIS — J9610 Chronic respiratory failure, unspecified whether with hypoxia or hypercapnia: Secondary | ICD-10-CM

## 2015-08-11 DIAGNOSIS — Z8249 Family history of ischemic heart disease and other diseases of the circulatory system: Secondary | ICD-10-CM | POA: Diagnosis not present

## 2015-08-11 DIAGNOSIS — Z7982 Long term (current) use of aspirin: Secondary | ICD-10-CM | POA: Diagnosis not present

## 2015-08-11 DIAGNOSIS — E785 Hyperlipidemia, unspecified: Secondary | ICD-10-CM | POA: Diagnosis not present

## 2015-08-11 DIAGNOSIS — Z951 Presence of aortocoronary bypass graft: Secondary | ICD-10-CM

## 2015-08-11 DIAGNOSIS — E669 Obesity, unspecified: Secondary | ICD-10-CM | POA: Diagnosis not present

## 2015-08-11 DIAGNOSIS — I2510 Atherosclerotic heart disease of native coronary artery without angina pectoris: Secondary | ICD-10-CM | POA: Diagnosis present

## 2015-08-11 DIAGNOSIS — J189 Pneumonia, unspecified organism: Secondary | ICD-10-CM

## 2015-08-11 DIAGNOSIS — Z9049 Acquired absence of other specified parts of digestive tract: Secondary | ICD-10-CM | POA: Diagnosis not present

## 2015-08-11 DIAGNOSIS — A419 Sepsis, unspecified organism: Secondary | ICD-10-CM | POA: Diagnosis present

## 2015-08-11 DIAGNOSIS — J8410 Pulmonary fibrosis, unspecified: Secondary | ICD-10-CM | POA: Diagnosis present

## 2015-08-11 DIAGNOSIS — Z7901 Long term (current) use of anticoagulants: Secondary | ICD-10-CM

## 2015-08-11 DIAGNOSIS — Z952 Presence of prosthetic heart valve: Secondary | ICD-10-CM | POA: Diagnosis not present

## 2015-08-11 DIAGNOSIS — Z87442 Personal history of urinary calculi: Secondary | ICD-10-CM | POA: Diagnosis not present

## 2015-08-11 DIAGNOSIS — Z9842 Cataract extraction status, left eye: Secondary | ICD-10-CM

## 2015-08-11 DIAGNOSIS — Z9889 Other specified postprocedural states: Secondary | ICD-10-CM

## 2015-08-11 DIAGNOSIS — G4733 Obstructive sleep apnea (adult) (pediatric): Secondary | ICD-10-CM | POA: Diagnosis not present

## 2015-08-11 DIAGNOSIS — Z9841 Cataract extraction status, right eye: Secondary | ICD-10-CM | POA: Diagnosis not present

## 2015-08-11 DIAGNOSIS — I10 Essential (primary) hypertension: Secondary | ICD-10-CM | POA: Diagnosis present

## 2015-08-11 DIAGNOSIS — J440 Chronic obstructive pulmonary disease with acute lower respiratory infection: Secondary | ICD-10-CM | POA: Diagnosis present

## 2015-08-11 DIAGNOSIS — J84112 Idiopathic pulmonary fibrosis: Secondary | ICD-10-CM

## 2015-08-11 DIAGNOSIS — Z79899 Other long term (current) drug therapy: Secondary | ICD-10-CM

## 2015-08-11 MED ORDER — VANCOMYCIN HCL IN DEXTROSE 1-5 GM/200ML-% IV SOLN
1000.0000 mg | Freq: Once | INTRAVENOUS | Status: AC
  Administered 2015-08-12: 1000 mg via INTRAVENOUS
  Filled 2015-08-11: qty 200

## 2015-08-11 MED ORDER — SODIUM CHLORIDE 0.9 % IV BOLUS (SEPSIS)
2000.0000 mL | Freq: Once | INTRAVENOUS | Status: AC
  Administered 2015-08-12: 2000 mL via INTRAVENOUS

## 2015-08-11 MED ORDER — HYDRALAZINE HCL 10 MG PO TABS
10.0000 mg | ORAL_TABLET | Freq: Once | ORAL | Status: AC
  Administered 2015-08-12: 10 mg via ORAL
  Filled 2015-08-11: qty 1

## 2015-08-11 MED ORDER — PIPERACILLIN-TAZOBACTAM 3.375 G IVPB 30 MIN
3.3750 g | Freq: Once | INTRAVENOUS | Status: AC
  Administered 2015-08-12: 3.375 g via INTRAVENOUS
  Filled 2015-08-11: qty 50

## 2015-08-11 NOTE — ED Provider Notes (Signed)
[facility] Emergency Department Provider Note  ____________________________________________  Time seen: 11:25 PM  I have reviewed the triage vital signs and the nursing notes.   HISTORY  Chief Complaint Shortness of Breath    HPI Adam Ibarra is a 65 y.o. male brought to the ED by EMS for shortness of breath. Patient states that shortness of breath started 11:00 AM today which he initially thought was a typical episode of shortness of breath for him as he has pulmonary fibrosis and uses nasal cannula oxygen up to 5 L as needed. Or, oxygen did not make him feel better as it normally does and throughout the day his symptoms were gradually worsening. He also has had chills and nonproductive cough. He feels very short of breath but denies any chest pain except for a small twinge in his right lateral chest that hurts when he moves and takes a deep breath.  No recent travel trauma hospitalizations or surgeries. No history of cancer or PE. Last surgery was 2 years ago     Past Medical History  Diagnosis Date  . Arthritis   . Hyperlipidemia   . Nephrolithiasis   . Type II diabetes mellitus (HCC)   . Aortic stenosis     a. 01/2012 s/p AVR with SJM 23mm mechanical valve.  . Obesity   . Coronary artery disease     a. 01/2012 s/p CABG x 3 (LIMA->LAD, VG->OM1,VG->PDA)  . Sleep apnea     a. on CPAP  . Hypertension   . Syncope     a. 01/2012 - presumed to be orthostatic in nature.  . Chronic venous insufficiency 05/24/2012     Patient Active Problem List   Diagnosis Date Noted  . Encounter for therapeutic drug monitoring 03/23/2013  . S/P AVR (aortic valve replacement) 05/24/2012  . Chronic venous insufficiency 05/24/2012  . CAD (coronary artery disease) 02/17/2012  . Hyperlipidemia   . Hypertension   . Diabetes mellitus without complication (HCC)   . Syncope 02/16/2012  . 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 Angina, class III (HCC) 01/20/2012     Past  Surgical History  Procedure Laterality Date  . Vasectomy    . Vasectomy reversal    . Appendectomy    . Cataract extraction, bilateral    . Coronary artery bypass graft  02/06/2012    Procedure: CORONARY ARTERY BYPASS GRAFTING (CABG);  Surgeon: Alleen Borne, MD;  Location: [facility] OR;  Service: Open Heart Surgery;  Laterality: N/A;  CABG x three,  using left internal mammary artery and right leg greater saphenous vein harvested endoscopically  . Aortic valve replacement  02/06/2012    Procedure: AORTIC VALVE REPLACEMENT (AVR);  Surgeon: Alleen Borne, MD;  Location: [facility] OR;  Service: Open Heart Surgery;  Laterality: N/A;  . Cardiac catheterization  01/23/2012    [facility]     Current Outpatient Rx  Name  Route  Sig  Dispense  Refill  . albuterol (PROVENTIL HFA;VENTOLIN HFA) 108 (90 BASE) MCG/ACT inhaler   Inhalation   Inhale 2 puffs into the lungs every 4 (four) hours as needed for wheezing or shortness of breath.         Marland Kitchen amitriptyline (ELAVIL) 25 MG tablet   Oral   Take 75-100 mg by mouth at bedtime.          Marland Kitchen amLODipine (NORVASC) 2.5 MG tablet   Oral   Take 2.5 mg by mouth daily.         Marland Kitchen  aspirin EC 81 MG tablet   Oral   Take 81 mg by mouth daily.         Marland Kitchen. atorvastatin (LIPITOR) 80 MG tablet   Oral   Take 1 tablet (80 mg total) by mouth daily.   90 tablet   3   . carvedilol (COREG) 25 MG tablet   Oral   Take 25 mg by mouth 2 (two) times daily with a meal.         . cyanocobalamin (,VITAMIN B-12,) 1000 MCG/ML injection   Intramuscular   Inject 1,000 mcg into the muscle every 30 (thirty) days.         . Dulaglutide (TRULICITY) 1.5 MG/0.5ML SOPN   Subcutaneous   Inject 1.5 mg into the skin once a week.         . gabapentin (NEURONTIN) 100 MG capsule   Oral   Take 100 mg by mouth at bedtime.         Marland Kitchen. glimepiride (AMARYL) 4 MG tablet   Oral   Take 4 mg by mouth 2 (two) times daily with a meal.          . hydrALAZINE (APRESOLINE) 10 MG tablet    Oral   Take 10 mg by mouth 3 (three) times daily.         . Insulin Glargine (LANTUS SOLOSTAR) 100 UNIT/ML Solostar Pen   Subcutaneous   Inject 5 Units into the skin at bedtime.         Marland Kitchen. warfarin (COUMADIN) 5 MG tablet      TAKE AS DIRECTED BY ANTICOAGULATION CLINIC Patient taking differently: Take 2.5-5 mg by mouth as directed. Take 5 mg qd except Sundays and Weds. Take 2.5 mg   90 tablet   1     90  day supply      Allergies Ivp dye   Family History  Problem Relation Age of Onset  . Heart disease Mother   . Heart disease Brother     Social History Social History  Substance Use Topics  . Smoking status: Former Smoker -- 3.00 packs/day for 9 years    Types: Cigarettes    Quit date: 02/02/1978  . Smokeless tobacco: Never Used  . Alcohol Use: No     Comment: SOCIAL    Review of Systems  Constitutional:   Positive chills Eyes:   No vision changes.  ENT:   No sore throat. No rhinorrhea. Cardiovascular:   No chest pain. Respiratory:   Positive shortness of breath and cough. Gastrointestinal:   Negative for abdominal pain, vomiting and diarrhea.  Genitourinary:   Negative for dysuria or difficulty urinating. Musculoskeletal:   Negative for focal pain or swelling Neurological:   Negative for headaches 10-point ROS otherwise negative.  ____________________________________________   PHYSICAL EXAM:  VITAL SIGNS: ED Triage Vitals  Enc Vitals Group     BP 08/11/15 2330 205/99 mmHg     Pulse Rate 08/11/15 2330 120     Resp 08/11/15 2330 23     Temp 08/11/15 2330 98.5 F (36.9 C)     Temp Source 08/11/15 2330 Oral     SpO2 08/11/15 2330 96 %     Weight 08/11/15 2330 256 lb (116.121 kg)     Height 08/11/15 2330 6' (1.829 m)     Head Cir --      Peak Flow --      Pain Score 08/11/15 2332 3     Pain Loc --  Pain Edu? --      Excl. in GC? --     Vital signs reviewed, nursing assessments reviewed.   Constitutional:   Alert and oriented.  Ill-appearing. Eyes:   No scleral icterus. No conjunctival pallor. PERRL. EOMI.  No nystagmus. ENT   Head:   Normocephalic and atraumatic.   Nose:   No congestion/rhinnorhea. No septal hematoma   Mouth/Throat:   Dry mucous membranes, no pharyngeal erythema. No peritonsillar mass.    Neck:   No stridor. No SubQ emphysema. No meningismus. Hematological/Lymphatic/Immunilogical:   No cervical lymphadenopathy. Cardiovascular:   Tachycardia 120. Symmetric bilateral radial and DP pulses.  No murmurs.  Respiratory:   Tachypnea and increased work of breathing. Crackles at bilateral bases. No wheezing.. Gastrointestinal:   Soft and nontender. Non distended. There is no CVA tenderness.  No rebound, rigidity, or guarding. Genitourinary:   deferred Musculoskeletal:   Nontender with normal range of motion in all extremities. No joint effusions.  No lower extremity tenderness.  No edema. Neurologic:   Normal speech and language.  CN 2-10 normal. Motor grossly intact. No gross focal neurologic deficits are appreciated.  Skin:    Skin is warm, dry and intact. No rash noted.  No petechiae, purpura, or bullae.  ____________________________________________    LABS (pertinent positives/negatives) (all labs ordered are listed, but only abnormal results are displayed) Labs Reviewed  COMPREHENSIVE METABOLIC PANEL - Abnormal; Notable for the following:    Glucose, Bld 178 (*)    Creatinine, Ser 1.34 (*)    Calcium 8.6 (*)    Albumin 3.0 (*)    GFR calc non Af Amer 54 (*)    All other components within normal limits  CBC WITH DIFFERENTIAL/PLATELET - Abnormal; Notable for the following:    WBC 14.6 (*)    Hemoglobin 12.7 (*)    HCT 38.0 (*)    RDW 15.4 (*)    Neutro Abs 12.7 (*)    Lymphs Abs 0.8 (*)    All other components within normal limits  APTT - Abnormal; Notable for the following:    aPTT 68 (*)    All other components within normal limits  PROTIME-INR - Abnormal; Notable for the  following:    Prothrombin Time 29.5 (*)    All other components within normal limits  CULTURE, EXPECTORATED SPUTUM-ASSESSMENT  CULTURE, BLOOD (ROUTINE X 2)  CULTURE, BLOOD (ROUTINE X 2)  URINE CULTURE  LACTIC ACID, PLASMA  LIPASE, BLOOD  TROPONIN I  LACTIC ACID, PLASMA  URINALYSIS COMPLETEWITH MICROSCOPIC ([facility] ONLY)   ____________________________________________   EKG  Interpreted by me Sinus tachycardia rate 112, left axis, normal intervals. Right bundle branch block. Normal ST segments and T waves.  ____________________________________________    RADIOLOGY  Chest x-ray consistent with chronic interstitial lung disease versus pulmonary fibrosis. No acute infiltrate noted. CT chest consistent with a lateral pulmonary fibrotic changes with superimposed groundglass opacities, possible acute infection  ____________________________________________   PROCEDURES CRITICAL CARE Performed by: Scotty Court, Janalyn Higby   Total critical care time: 35 minutes  Critical care time was exclusive of separately billable procedures and treating other patients.  Critical care was necessary to treat or prevent imminent or life-threatening deterioration.  Critical care was time spent personally by me on the following activities: development of treatment plan with patient and/or surrogate as well as nursing, discussions with consultants, evaluation of patient's response to treatment, examination of patient, obtaining history from patient or surrogate, ordering and performing treatments and interventions, ordering and review of  laboratory studies, ordering and review of radiographic studies, pulse oximetry and re-evaluation of patient's condition.   ____________________________________________   INITIAL IMPRESSION / ASSESSMENT AND PLAN / ED COURSE  Pertinent labs & imaging results that were available during my care of the patient were reviewed by me and considered in my medical decision making  (see chart for details).  Patient presents with fever tachycardia and hypoxia is worse than his usual chronic respiratory failure. Code sepsis initiated upon initial assessment. Patient given IV vancomycin and Zosyn empirically along with 2 L normal saline bolus. Due to his severe hypertension, I did give him his evening dose of oral hydralazine as well. With IV fluids his tachycardia improved. He maintained adequate oxygen saturation in the low 90s on 5 L nasal cannula oxygen. Workup was essentially unremarkable although CT chest consistent with a possible atypical pneumonia, and clinically the patient appears to have a pneumonia causing sepsis and acute on chronic respiratory failure with hypoxia. I discussed the case with the hospitalist for admission and further management.     ____________________________________________   FINAL CLINICAL IMPRESSION(S) / ED DIAGNOSES  Final diagnoses:  Acute on chronic respiratory failure with hypoxia (HCC)  Community acquired pneumonia       Portions of this note were generated with Scientist, clinical (histocompatibility and immunogenetics)dragon dictation software. Dictation errors may occur despite best attempts at proofreading.   Sharman CheekPhillip Jamonte Curfman, MD 08/12/15 [phone]

## 2015-08-11 NOTE — ED Notes (Signed)
[facility] reports that pt called out due to SOB. Pt's family reports O2 sat of 84% on room air. EMS reports on nasal cannula 93%, BP 204/100, Temp 101.0, CBG 160. Pt reports normal room sat of upper 80's to low 90's. Pt is alert, oriented, with NAD noted at this time in triage.

## 2015-08-12 ENCOUNTER — Emergency Department: Payer: 59

## 2015-08-12 ENCOUNTER — Other Ambulatory Visit: Payer: Self-pay

## 2015-08-12 DIAGNOSIS — Z9989 Dependence on other enabling machines and devices: Secondary | ICD-10-CM

## 2015-08-12 DIAGNOSIS — A419 Sepsis, unspecified organism: Secondary | ICD-10-CM | POA: Diagnosis present

## 2015-08-12 DIAGNOSIS — J84112 Idiopathic pulmonary fibrosis: Secondary | ICD-10-CM

## 2015-08-12 DIAGNOSIS — G4733 Obstructive sleep apnea (adult) (pediatric): Secondary | ICD-10-CM

## 2015-08-12 DIAGNOSIS — J9610 Chronic respiratory failure, unspecified whether with hypoxia or hypercapnia: Secondary | ICD-10-CM

## 2015-08-12 LAB — GLUCOSE, CAPILLARY
GLUCOSE-CAPILLARY: 144 mg/dL — AB (ref 65–99)
GLUCOSE-CAPILLARY: 193 mg/dL — AB (ref 65–99)
GLUCOSE-CAPILLARY: 163 mg/dL — AB (ref 65–99)
Glucose-Capillary: 274 mg/dL — ABNORMAL HIGH (ref 65–99)

## 2015-08-12 LAB — BASIC METABOLIC PANEL
ANION GAP: 5 (ref 5–15)
BUN: 19 mg/dL (ref 6–20)
CHLORIDE: 108 mmol/L (ref 101–111)
CO2: 25 mmol/L (ref 22–32)
Calcium: 8.1 mg/dL — ABNORMAL LOW (ref 8.9–10.3)
Creatinine, Ser: 1.27 mg/dL — ABNORMAL HIGH (ref 0.61–1.24)
GFR calc non Af Amer: 58 mL/min — ABNORMAL LOW (ref >60)
Glucose, Bld: 134 mg/dL — ABNORMAL HIGH (ref 65–99)
POTASSIUM: 4.1 mmol/L (ref 3.5–5.1)
Sodium: 138 mmol/L (ref 135–145)

## 2015-08-12 LAB — BRAIN NATRIURETIC PEPTIDE: B NATRIURETIC PEPTIDE 5: 156 pg/mL — AB (ref 0.0–100.0)

## 2015-08-12 LAB — PROTIME-INR
INR: 2.86
PROTHROMBIN TIME: 29.5 s — AB (ref 11.4–15.0)

## 2015-08-12 LAB — CBC WITH DIFFERENTIAL/PLATELET
BASOS ABS: 0 10*3/uL (ref 0–0.1)
EOS ABS: 0.2 10*3/uL (ref 0–0.7)
HEMATOCRIT: 38 % — AB (ref 40.0–52.0)
Hemoglobin: 12.7 g/dL — ABNORMAL LOW (ref 13.0–18.0)
Lymphocytes Relative: 5 %
Lymphs Abs: 0.8 10*3/uL — ABNORMAL LOW (ref 1.0–3.6)
MCH: 28.8 pg (ref 26.0–34.0)
MCHC: 33.5 g/dL (ref 32.0–36.0)
MCV: 86.1 fL (ref 80.0–100.0)
MONO ABS: 0.9 10*3/uL (ref 0.2–1.0)
NEUTROS ABS: 12.7 10*3/uL — AB (ref 1.4–6.5)
Neutrophils Relative %: 88 %
Platelets: 267 10*3/uL (ref 150–440)
RBC: 4.41 MIL/uL (ref 4.40–5.90)
RDW: 15.4 % — ABNORMAL HIGH (ref 11.5–14.5)
WBC: 14.6 10*3/uL — ABNORMAL HIGH (ref 3.8–10.6)

## 2015-08-12 LAB — COMPREHENSIVE METABOLIC PANEL
ALT: 18 U/L (ref 17–63)
AST: 29 U/L (ref 15–41)
Albumin: 3 g/dL — ABNORMAL LOW (ref 3.5–5.0)
Alkaline Phosphatase: 64 U/L (ref 38–126)
Anion gap: 9 (ref 5–15)
BILIRUBIN TOTAL: 0.7 mg/dL (ref 0.3–1.2)
BUN: 20 mg/dL (ref 6–20)
CALCIUM: 8.6 mg/dL — AB (ref 8.9–10.3)
CHLORIDE: 102 mmol/L (ref 101–111)
CO2: 25 mmol/L (ref 22–32)
CREATININE: 1.34 mg/dL — AB (ref 0.61–1.24)
GFR, EST NON AFRICAN AMERICAN: 54 mL/min — AB (ref >60)
Glucose, Bld: 178 mg/dL — ABNORMAL HIGH (ref 65–99)
Potassium: 4.7 mmol/L (ref 3.5–5.1)
Sodium: 136 mmol/L (ref 135–145)
TOTAL PROTEIN: 7.4 g/dL (ref 6.5–8.1)

## 2015-08-12 LAB — EXPECTORATED SPUTUM ASSESSMENT W REFEX TO RESP CULTURE

## 2015-08-12 LAB — LACTIC ACID, PLASMA
LACTIC ACID, VENOUS: 0.9 mmol/L (ref 0.5–2.0)
LACTIC ACID, VENOUS: 1.8 mmol/L (ref 0.5–2.0)

## 2015-08-12 LAB — CBC
HEMATOCRIT: 34.6 % — AB (ref 40.0–52.0)
HEMOGLOBIN: 11.6 g/dL — AB (ref 13.0–18.0)
MCH: 29 pg (ref 26.0–34.0)
MCHC: 33.6 g/dL (ref 32.0–36.0)
MCV: 86.4 fL (ref 80.0–100.0)
Platelets: 240 10*3/uL (ref 150–440)
RBC: 4.01 MIL/uL — AB (ref 4.40–5.90)
RDW: 14.9 % — ABNORMAL HIGH (ref 11.5–14.5)
WBC: 12.7 10*3/uL — ABNORMAL HIGH (ref 3.8–10.6)

## 2015-08-12 LAB — URINALYSIS COMPLETE WITH MICROSCOPIC (ARMC ONLY)
BILIRUBIN URINE: NEGATIVE
Glucose, UA: 150 mg/dL — AB
KETONES UR: NEGATIVE mg/dL
Leukocytes, UA: NEGATIVE
NITRITE: NEGATIVE
PH: 6 (ref 5.0–8.0)
Protein, ur: >500 mg/dL — AB
Specific Gravity, Urine: 1.022 (ref 1.005–1.030)

## 2015-08-12 LAB — APTT: aPTT: 68 seconds — ABNORMAL HIGH (ref 24–36)

## 2015-08-12 LAB — EXPECTORATED SPUTUM ASSESSMENT W GRAM STAIN, RFLX TO RESP C

## 2015-08-12 LAB — LIPASE, BLOOD: LIPASE: 28 U/L (ref 11–51)

## 2015-08-12 LAB — TROPONIN I

## 2015-08-12 MED ORDER — GLIMEPIRIDE 2 MG PO TABS
4.0000 mg | ORAL_TABLET | Freq: Two times a day (BID) | ORAL | Status: DC
  Administered 2015-08-12 – 2015-08-13 (×3): 4 mg via ORAL
  Filled 2015-08-12 (×3): qty 2

## 2015-08-12 MED ORDER — CARVEDILOL 6.25 MG PO TABS
25.0000 mg | ORAL_TABLET | Freq: Two times a day (BID) | ORAL | Status: DC
  Administered 2015-08-12 – 2015-08-13 (×3): 25 mg via ORAL
  Filled 2015-08-12 (×4): qty 4

## 2015-08-12 MED ORDER — WARFARIN SODIUM 5 MG PO TABS: 5.0000 mg | ORAL_TABLET | ORAL | Status: DC

## 2015-08-12 MED ORDER — ONDANSETRON HCL 4 MG PO TABS: 4.0000 mg | ORAL_TABLET | Freq: Four times a day (QID) | ORAL | Status: DC | PRN

## 2015-08-12 MED ORDER — GABAPENTIN 100 MG PO CAPS
100.0000 mg | ORAL_CAPSULE | Freq: Every day | ORAL | Status: DC
  Administered 2015-08-12: 100 mg via ORAL
  Filled 2015-08-12: qty 1

## 2015-08-12 MED ORDER — ATORVASTATIN CALCIUM 20 MG PO TABS
80.0000 mg | ORAL_TABLET | Freq: Every day | ORAL | Status: DC
  Administered 2015-08-12 – 2015-08-13 (×2): 80 mg via ORAL
  Filled 2015-08-12 (×2): qty 4

## 2015-08-12 MED ORDER — SENNOSIDES-DOCUSATE SODIUM 8.6-50 MG PO TABS: 1.0000 | ORAL_TABLET | Freq: Every evening | ORAL | Status: DC | PRN

## 2015-08-12 MED ORDER — SODIUM CHLORIDE 0.9% FLUSH
3.0000 mL | Freq: Two times a day (BID) | INTRAVENOUS | Status: DC
  Administered 2015-08-12 – 2015-08-13 (×4): 3 mL via INTRAVENOUS

## 2015-08-12 MED ORDER — ACETAMINOPHEN 325 MG PO TABS: 650.0000 mg | ORAL_TABLET | Freq: Four times a day (QID) | ORAL | Status: DC | PRN

## 2015-08-12 MED ORDER — INSULIN ASPART 100 UNIT/ML ~~LOC~~ SOLN: 0.0000 [IU] | Freq: Every day | SUBCUTANEOUS | Status: DC

## 2015-08-12 MED ORDER — ASPIRIN EC 81 MG PO TBEC
81.0000 mg | DELAYED_RELEASE_TABLET | Freq: Every day | ORAL | Status: DC
  Administered 2015-08-12 – 2015-08-13 (×2): 81 mg via ORAL
  Filled 2015-08-12 (×2): qty 1

## 2015-08-12 MED ORDER — AMITRIPTYLINE HCL 50 MG PO TABS
75.0000 mg | ORAL_TABLET | Freq: Every day | ORAL | Status: DC
  Administered 2015-08-12: 100 mg via ORAL
  Filled 2015-08-12: qty 2

## 2015-08-12 MED ORDER — WARFARIN - PHARMACIST DOSING INPATIENT: Freq: Every day | Status: DC

## 2015-08-12 MED ORDER — METOPROLOL TARTRATE 5 MG/5ML IV SOLN: 5.0000 mg | INTRAVENOUS | Status: DC | PRN

## 2015-08-12 MED ORDER — BUDESONIDE 0.25 MG/2ML IN SUSP
0.2500 mg | Freq: Two times a day (BID) | RESPIRATORY_TRACT | Status: DC
  Administered 2015-08-12 – 2015-08-13 (×2): 0.25 mg via RESPIRATORY_TRACT
  Filled 2015-08-12 (×2): qty 2

## 2015-08-12 MED ORDER — ONDANSETRON HCL 4 MG/2ML IJ SOLN: 4.0000 mg | Freq: Four times a day (QID) | INTRAMUSCULAR | Status: DC | PRN

## 2015-08-12 MED ORDER — AMLODIPINE BESYLATE 5 MG PO TABS
5.0000 mg | ORAL_TABLET | Freq: Every day | ORAL | Status: DC
  Administered 2015-08-12 – 2015-08-13 (×2): 5 mg via ORAL
  Filled 2015-08-12 (×2): qty 1

## 2015-08-12 MED ORDER — IPRATROPIUM-ALBUTEROL 0.5-2.5 (3) MG/3ML IN SOLN
3.0000 mL | RESPIRATORY_TRACT | Status: DC | PRN
  Administered 2015-08-12: 3 mL via RESPIRATORY_TRACT
  Filled 2015-08-12: qty 3

## 2015-08-12 MED ORDER — ACETAMINOPHEN 650 MG RE SUPP: 650.0000 mg | Freq: Four times a day (QID) | RECTAL | Status: DC | PRN

## 2015-08-12 MED ORDER — CLONIDINE HCL 0.1 MG PO TABS
0.1000 mg | ORAL_TABLET | Freq: Once | ORAL | Status: AC
  Administered 2015-08-12: 0.1 mg via ORAL
  Filled 2015-08-12: qty 1

## 2015-08-12 MED ORDER — INSULIN ASPART 100 UNIT/ML ~~LOC~~ SOLN
0.0000 [IU] | Freq: Three times a day (TID) | SUBCUTANEOUS | Status: DC
  Administered 2015-08-12: 8 [IU] via SUBCUTANEOUS
  Administered 2015-08-12: 3 [IU] via SUBCUTANEOUS
  Administered 2015-08-12: 2 [IU] via SUBCUTANEOUS
  Administered 2015-08-13: 3 [IU] via SUBCUTANEOUS
  Filled 2015-08-12: qty 8
  Filled 2015-08-12: qty 2
  Filled 2015-08-12 (×2): qty 3

## 2015-08-12 MED ORDER — PREDNISONE 20 MG PO TABS
40.0000 mg | ORAL_TABLET | Freq: Every day | ORAL | Status: DC
  Administered 2015-08-13: 40 mg via ORAL
  Filled 2015-08-12: qty 2

## 2015-08-12 MED ORDER — AZITHROMYCIN 500 MG PO TABS
500.0000 mg | ORAL_TABLET | Freq: Every day | ORAL | Status: AC
  Administered 2015-08-12: 500 mg via ORAL
  Filled 2015-08-12: qty 1

## 2015-08-12 MED ORDER — FUROSEMIDE 20 MG PO TABS
20.0000 mg | ORAL_TABLET | Freq: Every day | ORAL | Status: DC
  Administered 2015-08-12 – 2015-08-13 (×2): 20 mg via ORAL
  Filled 2015-08-12 (×2): qty 1

## 2015-08-12 MED ORDER — VANCOMYCIN HCL 10 G IV SOLR
1250.0000 mg | Freq: Two times a day (BID) | INTRAVENOUS | Status: DC
  Filled 2015-08-12: qty 1250

## 2015-08-12 MED ORDER — PREDNISONE 20 MG PO TABS
50.0000 mg | ORAL_TABLET | Freq: Four times a day (QID) | ORAL | Status: DC
  Administered 2015-08-12 (×2): 50 mg via ORAL
  Filled 2015-08-12 (×2): qty 1

## 2015-08-12 MED ORDER — WARFARIN SODIUM 5 MG PO TABS
2.5000 mg | ORAL_TABLET | ORAL | Status: DC
  Administered 2015-08-12: 2.5 mg via ORAL
  Filled 2015-08-12: qty 1

## 2015-08-12 MED ORDER — VANCOMYCIN HCL 500 MG IV SOLR
500.0000 mg | Freq: Once | INTRAVENOUS | Status: AC
  Administered 2015-08-12: 500 mg via INTRAVENOUS
  Filled 2015-08-12: qty 500

## 2015-08-12 MED ORDER — PIPERACILLIN-TAZOBACTAM 3.375 G IVPB
3.3750 g | Freq: Three times a day (TID) | INTRAVENOUS | Status: DC
  Administered 2015-08-12 – 2015-08-13 (×4): 3.375 g via INTRAVENOUS
  Filled 2015-08-12 (×7): qty 50

## 2015-08-12 MED ORDER — AZITHROMYCIN 250 MG PO TABS
250.0000 mg | ORAL_TABLET | Freq: Every day | ORAL | Status: DC
  Administered 2015-08-13: 250 mg via ORAL
  Filled 2015-08-12: qty 1

## 2015-08-12 MED ORDER — DIPHENHYDRAMINE HCL 25 MG PO CAPS
50.0000 mg | ORAL_CAPSULE | Freq: Once | ORAL | Status: AC
  Administered 2015-08-12: 50 mg via ORAL
  Filled 2015-08-12: qty 2

## 2015-08-12 MED ORDER — HYDRALAZINE HCL 10 MG PO TABS
10.0000 mg | ORAL_TABLET | Freq: Three times a day (TID) | ORAL | Status: DC
  Administered 2015-08-12 – 2015-08-13 (×4): 10 mg via ORAL
  Filled 2015-08-12 (×4): qty 1

## 2015-08-12 MED ORDER — INSULIN GLARGINE 100 UNIT/ML ~~LOC~~ SOLN
5.0000 [IU] | Freq: Every day | SUBCUTANEOUS | Status: DC
  Administered 2015-08-12: 5 [IU] via SUBCUTANEOUS
  Filled 2015-08-12 (×2): qty 0.05

## 2015-08-12 NOTE — H&P (Addendum)
PCP:   Lyndon Code, MD   Chief Complaint:  Shortness of breath  HPI: This is a 65 year old gentleman with known history of pulmonary fibrosis, obstructive sleep apnea on CPAP and chronic respiratory failure. He states that this evening at approximately 11 PM he developed shortness of breath that became worse as the evening wore on. He has had a cough that is productive of yellowish sputum. Patient denies fever, chills, nausea, vomiting, diarrhea, wheezes. He denies any chest pain, palpitation. He denies any lower extremity edema. His wife brought him to the ER mainly due to shortness of breath.  Review of Systems:  The patient denies anorexia, fever, weight loss,, vision loss, decreased hearing, hoarseness, chest pain, syncope, shortness of breath, dyspnea on exertion, peripheral edema, balance deficits, hemoptysis, abdominal pain, melena, hematochezia, severe indigestion/heartburn, hematuria, incontinence, genital sores, muscle weakness, suspicious skin lesions, transient blindness, difficulty walking, depression, unusual weight change, abnormal bleeding, enlarged lymph nodes, angioedema, and breast masses.  Past Medical History: Past Medical History  Diagnosis Date  . Arthritis   . Hyperlipidemia   . Nephrolithiasis   . Type II diabetes mellitus (HCC)   . Aortic stenosis     a. 01/2012 s/p AVR with SJM 23mm mechanical valve.  . Obesity   . Coronary artery disease     a. 01/2012 s/p CABG x 3 (LIMA->LAD, VG->OM1,VG->PDA)  . Sleep apnea     a. on CPAP  . Hypertension   . Syncope     a. 01/2012 - presumed to be orthostatic in nature.  . Chronic venous insufficiency 05/24/2012   Past Surgical History  Procedure Laterality Date  . Vasectomy    . Vasectomy reversal    . Appendectomy    . Cataract extraction, bilateral    . Coronary artery bypass graft  02/06/2012    Procedure: CORONARY ARTERY BYPASS GRAFTING (CABG);  Surgeon: Alleen Borne, MD;  Location: [facility] OR;  Service: Open Heart  Surgery;  Laterality: N/A;  CABG x three,  using left internal mammary artery and right leg greater saphenous vein harvested endoscopically  . Aortic valve replacement  02/06/2012    Procedure: AORTIC VALVE REPLACEMENT (AVR);  Surgeon: Alleen Borne, MD;  Location: [facility] OR;  Service: Open Heart Surgery;  Laterality: N/A;  . Cardiac catheterization  01/23/2012    [facility]    Medications: Prior to Admission medications   Medication Sig Start Date End Date Taking? Authorizing Provider  albuterol (PROVENTIL HFA;VENTOLIN HFA) 108 (90 BASE) MCG/ACT inhaler Inhale 2 puffs into the lungs every 4 (four) hours as needed for wheezing or shortness of breath.   Yes Historical Provider, MD  amitriptyline (ELAVIL) 25 MG tablet Take 75-100 mg by mouth at bedtime.    Yes Historical Provider, MD  amLODipine (NORVASC) 2.5 MG tablet Take 2.5 mg by mouth daily. 10/09/13  Yes Historical Provider, MD  aspirin EC 81 MG tablet Take 81 mg by mouth daily.   Yes Historical Provider, MD  atorvastatin (LIPITOR) 80 MG tablet Take 1 tablet (80 mg total) by mouth daily. 08/09/14  Yes Iran Ouch, MD  carvedilol (COREG) 25 MG tablet Take 25 mg by mouth 2 (two) times daily with a meal.   Yes Historical Provider, MD  cyanocobalamin (,VITAMIN B-12,) 1000 MCG/ML injection Inject 1,000 mcg into the muscle every 30 (thirty) days.   Yes Historical Provider, MD  Dulaglutide (TRULICITY) 1.5 MG/0.5ML SOPN Inject 1.5 mg into the skin once a week.   Yes Historical Provider, MD  gabapentin (NEURONTIN) 100 MG capsule Take 100 mg by mouth at bedtime.   Yes Historical Provider, MD  glimepiride (AMARYL) 4 MG tablet Take 4 mg by mouth 2 (two) times daily with a meal.    Yes Historical Provider, MD  hydrALAZINE (APRESOLINE) 10 MG tablet Take 10 mg by mouth 3 (three) times daily.   Yes Historical Provider, MD  Insulin Glargine (LANTUS SOLOSTAR) 100 UNIT/ML Solostar Pen Inject 5 Units into the skin at bedtime.   Yes Historical Provider, MD  warfarin  (COUMADIN) 5 MG tablet TAKE AS DIRECTED BY ANTICOAGULATION CLINIC Patient taking differently: Take 2.5-5 mg by mouth as directed. Take 5 mg qd except Sundays and Weds. Take 2.5 mg 05/09/15  Yes Iran OuchMuhammad A Arida, MD    Allergies:   Allergies  Allergen Reactions  . Ivp Dye [Iodinated Diagnostic Agents] Itching    Can tolerate if takes benadryl    Social History:  reports that he quit smoking about 37 years ago. His smoking use included Cigarettes. He has a 27 pack-year smoking history. He has never used smokeless tobacco. He reports that he does not drink alcohol or use illicit drugs.  Family History: Family History  Problem Relation Age of Onset  . Heart disease Mother   . Heart disease Brother     Physical Exam: Filed Vitals:   08/11/15 2330 08/12/15 0100 08/12/15 0200  BP: 180/94 170/79 190/79  Pulse: 120 107 110  Temp: 98.5 F (36.9 C)    TempSrc: Oral    Resp: 22 18 25   Height: 6' (1.829 m)    Weight: 116.121 kg (256 lb)    SpO2: 96% 96% 92%    General:  Alert and oriented times three, well developed and nourished, no acute distress Eyes: PERRLA, pink conjunctiva, no scleral icterus ENT: Moist oral mucosa, neck supple, no thyromegaly Lungs: clear to ascultation, no wheeze, mild crackles, no use of accessory muscles Cardiovascular: regular rate and rhythm, no regurgitation, no gallops, no murmurs. No carotid bruits, no JVD Abdomen: soft, positive BS, non-tender, non-distended, no organomegaly, not an acute abdomen GU: not examined Neuro: CN II - XII grossly intact, sensation intact Musculoskeletal: strength 5/5 all extremities, no clubbing, cyanosis or edema Skin: no rash, no subcutaneous crepitation, no decubitus Psych: appropriate patient   Labs on Admission:   Recent Labs  08/12/15 0025  NA 136  K 4.7  CL 102  CO2 25  GLUCOSE 178*  BUN 20  CREATININE 1.34*  CALCIUM 8.6*    Recent Labs  08/12/15 0025  AST 29  ALT 18  ALKPHOS 64  BILITOT 0.7   PROT 7.4  ALBUMIN 3.0*    Recent Labs  08/12/15 0025  LIPASE 28    Recent Labs  08/12/15 0025  WBC 14.6*  NEUTROABS 12.7*  HGB 12.7*  HCT 38.0*  MCV 86.1  PLT 267    Recent Labs  08/12/15 0025  TROPONINI <0.03   Invalid input(s): POCBNP No results for input(s): DDIMER in the last 72 hours. No results for input(s): HGBA1C in the last 72 hours. No results for input(s): CHOL, HDL, LDLCALC, TRIG, CHOLHDL, LDLDIRECT in the last 72 hours. No results for input(s): TSH, T4TOTAL, T3FREE, THYROIDAB in the last 72 hours.  Invalid input(s): FREET3 No results for input(s): VITAMINB12, FOLATE, FERRITIN, TIBC, IRON, RETICCTPCT in the last 72 hours.  Micro Results: Recent Results (from the past 240 hour(s))  Culture, sputum-assessment     Status: None   Collection Time: 08/12/15 12:25 AM  Result Value Ref Range Status   Specimen Description EXPECTORATED SPUTUM  Final   Special Requests NONE  Final   Sputum evaluation   Final    Sputum specimen not acceptable for testing.  Please recollect.   C/REBECCA LYNN AT [id number] 08/12/15.PMH   Report Status 08/12/2015 FINAL  Final     Radiological Exams on Admission: Ct Chest Wo Contrast  08/12/2015  CLINICAL DATA:  Acute onset of dyspnea, generalized chest pain and fever. Recently diagnosed with idiopathic pulmonary fibrosis. Initial encounter. EXAM: CT CHEST WITHOUT CONTRAST TECHNIQUE: Multidetector CT imaging of the chest was performed following the standard protocol without IV contrast. COMPARISON:  Chest radiograph performed 08/11/2015 FINDINGS: Cardiovascular: An aortic valve replacement is noted. Diffuse coronary artery calcifications are seen. The patient is status post median sternotomy. Scattered calcification is noted along the thoracic aorta and great vessels. Mediastinum: The mediastinum is grossly unremarkable in appearance. No definite mediastinal lymphadenopathy is seen, though this is difficult to fully assess without contrast.  No pericardial effusion is identified. The patient is status post median sternotomy. Lungs/Pleura: Diffuse bilateral pulmonary fibrotic change is noted, with hazy ground-glass opacity and underlying extensive bronchiectasis. Findings are compatible with the patient's diagnosis of idiopathic pulmonary fibrosis. No definite superimposed focal airspace consolidation is seen, though without prior CT for comparison, it cannot be excluded. Upper Abdomen: The liver and spleen are unremarkable in appearance. The gallbladder is within normal limits. The visualized portions of the pancreas, adrenal glands and kidneys are grossly unremarkable, aside from a 4 mm nonobstructing stone at the upper pole of the left kidney. Scattered calcification is noted along the proximal abdominal aorta and its branches, including along the superior mesenteric artery. Musculoskeletal: No acute osseous abnormalities are identified. IMPRESSION: 1. Diffuse bilateral pulmonary fibrotic change, with hazy ground-glass opacity and underlying extensive bronchiectasis. Findings compatible with the patient's diagnosis of idiopathic pulmonary fibrosis. No definite superimposed focal airspace consolidation seen, though without prior CT for comparison, it cannot be excluded. 2. Diffuse coronary artery calcifications seen. 3. 4 mm nonobstructing stone at the upper pole of the left kidney. 4. Scattered calcification along the thoracic and proximal abdominal aorta and its branches, including along the superior mesenteric artery. Electronically Signed   By: Roanna RaiderJeffery  Chang M.D.   On: 08/12/2015 02:09   Dg Chest Port 1 View  08/11/2015  CLINICAL DATA:  65 year old male with shortness of breath EXAM: PORTABLE CHEST 1 VIEW COMPARISON:  Chest radiograph dated 12/29/2014 FINDINGS: Single portable view of the chest demonstrate emphysematous changes of the lungs with diffuse interstitial coarsening likely related to underlying interstitial lung disease or pulmonary  fibrosis. An area of increased airspace density and the left lung base appears grossly similar to prior study and likely chronic. Pneumonia is less likely but not excluded. There is no significant pleural effusion. No pneumothorax. Median sternotomy wires and CABG vascular clips noted. The cardiac silhouette is within normal limits. No acute osseous pathology identified. IMPRESSION: Diffuse chronic interstitial lung disease or pulmonary fibrosis. No definite focal consolidation. Electronically Signed   By: Elgie CollardArash  Radparvar M.D.   On: 08/11/2015 23:59    Assessment/Plan Present on Admission:  . Sepsis (HCC)/pneumonia Acute on Chronic respiratory failure  -Bring in for 23 hour observation -Blood cultures collected, and urine cultures. -IV antibiotics Zosyn and vancomycin pharmacy to dose history clinical pneumonia -Duonebs and oxygen to keep sats greater than 88%. Currently the patient is on 7 L of oxygen -Doubt PE, patient on Coumadin with therapeutic INR 2.86  Pulmonary fibrosis -Stable, home medications resumed  Obstructive sleep apnea -Stable.  on CPAP  . CAD (coronary artery disease) -Stable, resume home medications  Aortic valve replacement -Stable, resume Coumadin, pharmacy to dose  . Hyperlipidemia -Stable, resume home medications  . Hypertension -Home medications resumed his, when necessary blood pressure medications ordered    Aleecia Tapia 08/12/2015, 2:38 AM

## 2015-08-12 NOTE — Progress Notes (Signed)
ANTICOAGULATION CONSULT NOTE - Initial Consult  Pharmacy Consult for warfarin Indication: mechanical aortic valve  Allergies  Allergen Reactions  . Ivp Dye [Iodinated Diagnostic Agents] Itching    Can tolerate if takes benadryl    Patient Measurements: Height: 6' (182.9 cm) Weight: 256 lb (116.121 kg) IBW/kg (Calculated) : 77.6 Heparin Dosing Weight:   Vital Signs: Temp: 98.5 F (36.9 C) (06/24 2330) Temp Source: Oral (06/24 2330) BP: 153/78 mmHg (06/25 0300) Pulse Rate: 102 (06/25 0300)  Labs:  Recent Labs  08/12/15 0025  HGB 12.7*  HCT 38.0*  PLT 267  APTT 68*  LABPROT 29.5*  INR 2.86  CREATININE 1.34*  TROPONINI <0.03    Estimated Creatinine Clearance: 72.3 mL/min (by C-G formula based on Cr of 1.34).   Medical History: Past Medical History  Diagnosis Date  . Arthritis   . Hyperlipidemia   . Nephrolithiasis   . Type II diabetes mellitus (HCC)   . Aortic stenosis     a. 01/2012 s/p AVR with SJM 23mm mechanical valve.  . Obesity   . Coronary artery disease     a. 01/2012 s/p CABG x 3 (LIMA->LAD, VG->OM1,VG->PDA)  . Sleep apnea     a. on CPAP  . Hypertension   . Syncope     a. 01/2012 - presumed to be orthostatic in nature.  . Chronic venous insufficiency 05/24/2012    Medications:  Infusions:    Assessment: 64 yom cc SOB with hx pulmonary fibrosis. Tachy/hypoxia and chronic respiratory failure. ED prescriber called code sepsis, on vancomycin and Zosyn. Pharmacy consulted to continue VKA for mechanical aortic valve. On admission, INR therapeutic 2.86.  Goal of Therapy:  INR 2.5 to 3.5 Monitor platelets by anticoagulation protocol: Yes   Plan:  Warfarin 5 mg po on Mondays, Tuesdays, Thursdays, Fridays and Saturdays, and warfarin 2.5 mg po on Sundays and Wednesdays (home regimen). Pharmacy will continue to follow and adjust as needed t maintain target INR.  Carola FrostNathan A Kimisha Eunice, Pharm.D., BCPS Clinical Pharmacist 08/12/2015,3:37 AM

## 2015-08-12 NOTE — Progress Notes (Signed)
Pharmacy Antibiotic Note  Adam Ibarra is a 65 y.o. male admitted on 08/11/2015 with sepsis.  Pharmacy has been consulted for Zosyn and vancomycin dosing.  Plan: 1. Zosyn 3.375 gm IV Q8H EI 2. Vancomycin 1 gm IV x 1 in ED, will give additional 500 mg IV x 1 now for total of 1.5 gm first dose, followed in approximately 7 hours (stacked dosing) by vancomycin 1.25 gm IV Q12H, predicted trough 17 mcg/mL. Pharmacy will continue to follow and adjust as needed to maintain trough 15 to 20 mcg/mL.   Vd 65.1 L, Ke 0.064 hr-1, T1/2 10.8 hr  Height: 6' (182.9 cm) Weight: 256 lb (116.121 kg) IBW/kg (Calculated) : 77.6  Temp (24hrs), Avg:98.5 F (36.9 C), Min:98.5 F (36.9 C), Max:98.5 F (36.9 C)   Recent Labs Lab 08/12/15 0025  WBC 14.6*  CREATININE 1.34*  LATICACIDVEN 1.8    Estimated Creatinine Clearance: 72.3 mL/min (by C-G formula based on Cr of 1.34).    Allergies  Allergen Reactions  . Ivp Dye [Iodinated Diagnostic Agents] Itching    Can tolerate if takes benadryl   Thank you for allowing pharmacy to be a part of this patient's care.  Adam Ibarra, Pharm.D., BCPS Clinical Pharmacist 08/12/2015 1:13 AM

## 2015-08-12 NOTE — Progress Notes (Signed)
Patient ID: Adam Ibarra, male   DOB: [birth year], 65 y.o.   MRN: [id number] [facility] PROGRESS NOTE  Adam Ibarra WUJ:[id number] DOB: September 28, 1950 DOA: 08/11/2015 PCP: Lyndon Code, MD  HPI/Subjective: Patient had shortness of breath starting yesterday. He is feeling better today and breathing more comfortably. At home, normally he wears no oxygen at rest and 3-5 L with ambulation.  Objective: Filed Vitals:   08/12/15 0837 08/12/15 1123  BP: 144/80 120/56  Pulse:  85  Temp:  97.6 F (36.4 C)  Resp:  18    Filed Weights   08/11/15 2330  Weight: 116.121 kg (256 lb)    ROS: Review of Systems  Constitutional: Negative for fever and chills.  Eyes: Negative for blurred vision.  Respiratory: Positive for shortness of breath. Negative for cough.   Cardiovascular: Negative for chest pain.  Gastrointestinal: Negative for nausea, vomiting, abdominal pain, diarrhea and constipation.  Genitourinary: Negative for dysuria.  Musculoskeletal: Negative for joint pain.  Neurological: Negative for dizziness and headaches.   Exam: Physical Exam  Constitutional: He is oriented to person, place, and time.  HENT:  Nose: No mucosal edema.  Mouth/Throat: No oropharyngeal exudate or posterior oropharyngeal edema.  Eyes: Conjunctivae, EOM and lids are normal. Pupils are equal, round, and reactive to light.  Neck: No JVD present. Carotid bruit is not present. No edema present. No thyroid mass and no thyromegaly present.  Cardiovascular: S1 normal and S2 normal.  Exam reveals no gallop.   No murmur heard. Pulses:      Dorsalis pedis pulses are 2+ on the right side, and 2+ on the left side.  Respiratory: No respiratory distress. He has decreased breath sounds in the right middle field, the right lower field, the left middle field and the left lower field. He has no wheezes. He has no rhonchi. He has no rales.  GI: Soft. Bowel sounds are normal. There is no tenderness.  Musculoskeletal:        Right ankle: He exhibits swelling.       Left ankle: He exhibits swelling.  Lymphadenopathy:    He has no cervical adenopathy.  Neurological: He is alert and oriented to person, place, and time. No cranial nerve deficit.  Skin: Skin is warm. No rash noted. Nails show no clubbing.  Psychiatric: He has a normal mood and affect.      Data Reviewed: Basic Metabolic Panel:  Recent Labs Lab 08/12/15 0025 08/12/15 0500  NA 136 138  K 4.7 4.1  CL 102 108  CO2 25 25  GLUCOSE 178* 134*  BUN 20 19  CREATININE 1.34* 1.27*  CALCIUM 8.6* 8.1*   Liver Function Tests:  Recent Labs Lab 08/12/15 0025  AST 29  ALT 18  ALKPHOS 64  BILITOT 0.7  PROT 7.4  ALBUMIN 3.0*    Recent Labs Lab 08/12/15 0025  LIPASE 28   CBC:  Recent Labs Lab 08/12/15 0025 08/12/15 0500  WBC 14.6* 12.7*  NEUTROABS 12.7*  --   HGB 12.7* 11.6*  HCT 38.0* 34.6*  MCV 86.1 86.4  PLT 267 240   Cardiac Enzymes:  Recent Labs Lab 08/12/15 0025  TROPONINI <0.03   BNP (last 3 results)  Recent Labs  08/12/15 0500  BNP 156.0*     CBG:  Recent Labs Lab 08/12/15 0812 08/12/15 1124  GLUCAP 144* 274*    Recent Results (from the past 240 hour(s))  Blood Culture (routine x 2)     Status: None (Preliminary result)  Collection Time: 08/12/15 12:25 AM  Result Value Ref Range Status   Specimen Description BLOOD RIGHT ANTECUBITAL  Final   Special Requests BOTTLES DRAWN AEROBIC AND ANAEROBIC 8CC  Final   Culture NO GROWTH < 12 HOURS  Final   Report Status PENDING  Incomplete  Culture, sputum-assessment     Status: None   Collection Time: 08/12/15 12:25 AM  Result Value Ref Range Status   Specimen Description EXPECTORATED SPUTUM  Final   Special Requests NONE  Final   Sputum evaluation   Final    Sputum specimen not acceptable for testing.  Please recollect.   C/REBECCA LYNN AT [id number] 08/12/15.PMH   Report Status 08/12/2015 FINAL  Final  Blood Culture (routine x 2)     Status: None  (Preliminary result)   Collection Time: 08/12/15 12:30 AM  Result Value Ref Range Status   Specimen Description BLOOD BLOOD LEFT HAND  Final   Special Requests BOTTLES DRAWN AEROBIC AND ANAEROBIC 8CC  Final   Culture NO GROWTH < 12 HOURS  Final   Report Status PENDING  Incomplete     Studies: Ct Chest Wo Contrast  08/12/2015  CLINICAL DATA:  Acute onset of dyspnea, generalized chest pain and fever. Recently diagnosed with idiopathic pulmonary fibrosis. Initial encounter. EXAM: CT CHEST WITHOUT CONTRAST TECHNIQUE: Multidetector CT imaging of the chest was performed following the standard protocol without IV contrast. COMPARISON:  Chest radiograph performed 08/11/2015 FINDINGS: Cardiovascular: An aortic valve replacement is noted. Diffuse coronary artery calcifications are seen. The patient is status post median sternotomy. Scattered calcification is noted along the thoracic aorta and great vessels. Mediastinum: The mediastinum is grossly unremarkable in appearance. No definite mediastinal lymphadenopathy is seen, though this is difficult to fully assess without contrast. No pericardial effusion is identified. The patient is status post median sternotomy. Lungs/Pleura: Diffuse bilateral pulmonary fibrotic change is noted, with hazy ground-glass opacity and underlying extensive bronchiectasis. Findings are compatible with the patient's diagnosis of idiopathic pulmonary fibrosis. No definite superimposed focal airspace consolidation is seen, though without prior CT for comparison, it cannot be excluded. Upper Abdomen: The liver and spleen are unremarkable in appearance. The gallbladder is within normal limits. The visualized portions of the pancreas, adrenal glands and kidneys are grossly unremarkable, aside from a 4 mm nonobstructing stone at the upper pole of the left kidney. Scattered calcification is noted along the proximal abdominal aorta and its branches, including along the superior mesenteric artery.  Musculoskeletal: No acute osseous abnormalities are identified. IMPRESSION: 1. Diffuse bilateral pulmonary fibrotic change, with hazy ground-glass opacity and underlying extensive bronchiectasis. Findings compatible with the patient's diagnosis of idiopathic pulmonary fibrosis. No definite superimposed focal airspace consolidation seen, though without prior CT for comparison, it cannot be excluded. 2. Diffuse coronary artery calcifications seen. 3. 4 mm nonobstructing stone at the upper pole of the left kidney. 4. Scattered calcification along the thoracic and proximal abdominal aorta and its branches, including along the superior mesenteric artery. Electronically Signed   By: Roanna RaiderJeffery  Chang M.D.   On: 08/12/2015 02:09   Dg Chest Port 1 View  08/11/2015  CLINICAL DATA:  65 year old male with shortness of breath EXAM: PORTABLE CHEST 1 VIEW COMPARISON:  Chest radiograph dated 12/29/2014 FINDINGS: Single portable view of the chest demonstrate emphysematous changes of the lungs with diffuse interstitial coarsening likely related to underlying interstitial lung disease or pulmonary fibrosis. An area of increased airspace density and the left lung base appears grossly similar to prior study and likely chronic. Pneumonia  is less likely but not excluded. There is no significant pleural effusion. No pneumothorax. Median sternotomy wires and CABG vascular clips noted. The cardiac silhouette is within normal limits. No acute osseous pathology identified. IMPRESSION: Diffuse chronic interstitial lung disease or pulmonary fibrosis. No definite focal consolidation. Electronically Signed   By: Elgie CollardArash  Radparvar M.D.   On: 08/11/2015 23:59    Scheduled Meds: . amitriptyline  75-100 mg Oral QHS  . amLODipine  5 mg Oral Daily  . aspirin EC  81 mg Oral Daily  . atorvastatin  80 mg Oral Daily  . [START ON 08/13/2015] azithromycin  250 mg Oral Daily  . budesonide (PULMICORT) nebulizer solution  0.25 mg Nebulization BID  .  carvedilol  25 mg Oral BID WC  . diphenhydrAMINE  50 mg Oral Once  . furosemide  20 mg Oral Daily  . gabapentin  100 mg Oral QHS  . glimepiride  4 mg Oral BID WC  . hydrALAZINE  10 mg Oral TID  . insulin aspart  0-15 Units Subcutaneous TID WC  . insulin aspart  0-5 Units Subcutaneous QHS  . insulin glargine  5 Units Subcutaneous QHS  . piperacillin-tazobactam (ZOSYN)  IV  3.375 g Intravenous Q8H  . [START ON 08/13/2015] predniSONE  40 mg Oral Q breakfast  . sodium chloride flush  3 mL Intravenous Q12H  . [START ON 08/13/2015] warfarin  5 mg Oral Once per day on Mon Tue Thu Fri Sat   And  . warfarin  2.5 mg Oral Once per day on Sun Wed  . Warfarin - Pharmacist Dosing Inpatient   Does not apply q1800    Assessment/Plan:  1. Acute on chronic respiratory failure. Patient normally wears 3-5 L of oxygen with ambulation and no oxygen at rest. I dialed down the oxygen to 3 L. I spoke with the nursing staff to not have them higher than 3 L. I will check a pulse ox on room air at home. 2. COPD exacerbation with pulmonary fibrosis. Continue nebulizers and budesonide. Antibiotics for COPD exacerbation. Prednisone taper. 3. Sepsis ruled out. Pneumonia ruled out. 4. Aortic valve replacement on Coumadin. 5. I added on a BNP which was slightly elevated I added low-dose Lasix 6. Hyperlipidemia unspecified on atorvastatin 7. Type 2 diabetes. Sugars will be high on steroids continue Lantus and sliding scale. 8. History of sleep apnea  Code Status:     Code Status Orders        Start     Ordered   08/12/15 0333  Full code   Continuous     08/12/15 0332    Code Status History    Date Active Date Inactive Code Status Order ID Comments User Context   02/06/2012  2:25 PM 02/08/2012 12:12 PM Full Code [id number]  Dell PontoSteven Michael Shaw, RN Inpatient     Disposition Plan: Potentially home tomorrow if breathing improves  Antibiotics:  Zosyn  Zithromax  Time spent: 35 minutes  Alford HighlandWIETING,  Henryk Ursin  [employer]

## 2015-08-13 LAB — GLUCOSE, CAPILLARY
Glucose-Capillary: 81 mg/dL (ref 65–99)
Glucose-Capillary: 167 mg/dL — ABNORMAL HIGH (ref 65–99)

## 2015-08-13 LAB — URINE CULTURE: Culture: NO GROWTH

## 2015-08-13 LAB — PROTIME-INR
INR: 2.7
Prothrombin Time: 28.3 s — ABNORMAL HIGH (ref 11.4–15.0)

## 2015-08-13 MED ORDER — AZITHROMYCIN 500 MG PO TABS: 500.0000 mg | ORAL_TABLET | Freq: Every day | ORAL | Status: DC

## 2015-08-13 MED ORDER — FUROSEMIDE 20 MG PO TABS: 20.0000 mg | ORAL_TABLET | Freq: Every day | ORAL | Status: DC

## 2015-08-13 MED ORDER — PREDNISONE 10 MG (21) PO TBPK: 10.0000 mg | ORAL_TABLET | Freq: Every day | ORAL | Status: DC

## 2015-08-13 NOTE — Care Management Note (Signed)
Case Management Note  Patient Details  Name: Adam Ibarra MRN: [id number] Date of Birth: Jun 12, 1950  Subjective/Objective:                  Met with patient to discuss discharge planning. Patient's PCP is Dr. Clayborn Bigness. He has an O2 concentrator he purchased on his own but no order for home O2 (until now); No portable O2 tanks. [facility]. He does not requiring DME for ambulation. He uses [facility] on [address] For Rx. He denies problems with meds. He does not have a nebulizer at home.  Action/Plan: [facility]erral to [facility] for home O2- to be delivered today prior to discharge. . No further RNCM needs.   Expected Discharge Date:                  Expected Discharge Plan:     In-House Referral:     Discharge planning Services  CM Consult  Post Acute Care Choice:  Durable Medical Equipment Choice offered to:  Patient  DME Arranged:  Oxygen DME Agency:  [place]:    [place] Agency:     Status of Service:  In process, will continue to follow  If discussed at Long Length of Stay Meetings, dates discussed:    Additional Comments:  Marshell Garfinkel, RN 08/13/2015, 2:01 PM

## 2015-08-13 NOTE — Progress Notes (Signed)
ANTICOAGULATION CONSULT NOTE - Follow up Consult  Pharmacy Consult for warfarin Indication: mechanical aortic valve  Allergies  Allergen Reactions  . Ivp Dye [Iodinated Diagnostic Agents] Itching    Can tolerate if takes benadryl    Patient Measurements: Height: 6' (182.9 cm) Weight: 256 lb (116.121 kg) IBW/kg (Calculated) : 77.6  Vital Signs: Temp: 97.4 F (36.3 C) (06/26 0745) Temp Source: Oral (06/26 0745) BP: 147/66 mmHg (06/26 0745) Pulse Rate: 78 (06/26 0745)  Labs:  Recent Labs  08/12/15 0025 08/12/15 0500 08/13/15 0414  HGB 12.7* 11.6*  --   HCT 38.0* 34.6*  --   PLT 267 240  --   APTT 68*  --   --   LABPROT 29.5*  --  28.3*  INR 2.86  --  2.70  CREATININE 1.34* 1.27*  --   TROPONINI <0.03  --   --     Estimated Creatinine Clearance: 76.3 mL/min (by C-G formula based on Cr of 1.27).   Assessment: 64 yom cc SOB with hx pulmonary fibrosis, treating for PNA.  Pharmacy consulted to continue VKA for mechanical aortic valve. On admission, INR therapeutic 2.86 on 6/25. Currently on Zosyn and azithromycin. Home regimen noted to be warfarin 5 mg daily except 2.5 mg on Sun/Wed per PTA med list. Of note cards note 05/24/12 states INR goal 2-3.  6/25 2.86  Warfarin 2.5 mg 6/26 2.70  Goal of Therapy:  INR 2-3 Monitor platelets by anticoagulation protocol: Yes   Plan:  INR remains within goal range. Continue home regimen of Warfarin 5 mg po on Mondays, Tuesdays, Thursdays, Fridays and Saturdays, and warfarin 2.5 mg po on Sundays and Wednesdays. Pt on abx that may interact with warfarin. Follow up daily INR.   Pharmacy will continue to follow and adjust as needed to maintain target INR.  Marty HeckWang, Dora Clauss L, Pharm.D., BCPS Clinical Pharmacist 08/13/2015,10:34 AM

## 2015-08-13 NOTE — Progress Notes (Signed)
   08/13/15 0930  Clinical Encounter Type  Visited With Patient  Visit Type Initial  Consult/Referral To Chaplain  Stress Factors  Patient Stress Factors Not reviewed  Family Stress Factors Not reviewed  pt was optimistic of being discharged soon.

## 2015-08-13 NOTE — Progress Notes (Addendum)
SATURATION QUALIFICATIONS: (This note is used to comply with regulatory documentation for home oxygen)  Patient Saturations on Room Air at Rest = 80%   Please briefly explain why patient needs home oxygen: COPD

## 2015-08-13 NOTE — Progress Notes (Signed)
Patient is alert and oriented x 4, ambulatory in room, denies pain, qualifies for oxygen, pt d/c to home with oxygen using [facility]. Portable tank delivered to room, concentrator to be delivered to house. Pt has an appt scheduled for f/u, RX faxed to pharmacy, patient reports understanding of d/c instructions and has no further questions at this time. Wife at bedside and updated on d/c plan.

## 2015-08-13 NOTE — Progress Notes (Signed)
Pharmacy Antibiotic Note  Adam Ibarra is a 65 y.o. male admitted on 08/11/2015 with sepsis.  Pharmacy has been consulted for Zosyn dosing.  Plan: Will continue Zosyn 3.375 gm IV Q8H EI  Height: 6' (182.9 cm) Weight: 256 lb (116.121 kg) IBW/kg (Calculated) : 77.6  Temp (24hrs), Avg:97.7 F (36.5 C), Min:97.4 F (36.3 C), Max:98 F (36.7 C)   Recent Labs Lab 08/12/15 0025 08/12/15 0324 08/12/15 0500  WBC 14.6*  --  12.7*  CREATININE 1.34*  --  1.27*  LATICACIDVEN 1.8 0.9  --     Estimated Creatinine Clearance: 76.3 mL/min (by C-G formula based on Cr of 1.27).    Allergies  Allergen Reactions  . Ivp Dye [Iodinated Diagnostic Agents] Itching    Can tolerate if takes benadryl   Thank you for allowing pharmacy to be a part of this patient's care.  Marty HeckWang, Rawson Minix L, Pharm.D., BCPS Clinical Pharmacist 08/13/2015 10:52 AM

## 2015-08-13 NOTE — Discharge Instructions (Signed)
Chronic Obstructive Pulmonary Disease °Chronic obstructive pulmonary disease (COPD) is a common lung problem. In COPD, the flow of air from the lungs is limited. The way your lungs work will probably never return to normal, but there are things you can do to improve your lungs and make yourself feel better. Your doctor may treat your condition with: °· Medicines. °· Oxygen. °· Lung surgery. °· Changes to your diet. °· Rehabilitation. This may involve a team of specialists. °HOME CARE °· Take all medicines as told by your doctor. °· Avoid medicines or cough syrups that dry up your airway (such as antihistamines) and do not allow you to get rid of thick spit. You do not need to avoid them if told differently by your doctor. °· If you smoke, stop. Smoking makes the problem worse. °· Avoid being around things that make your breathing worse (like smoke, chemicals, and fumes). °· Use oxygen therapy and therapy to help improve your lungs (pulmonary rehabilitation) if told by your doctor. If you need home oxygen therapy, ask your doctor if you should buy a tool to measure your oxygen level (oximeter). °· Avoid people who have a sickness you can catch (contagious). °· Avoid going outside when it is very hot, cold, or humid. °· Eat healthy foods. Eat smaller meals more often. Rest before meals. °· Stay active, but remember to also rest. °· Make sure to get all the shots (vaccines) your doctor recommends. Ask your doctor if you need a pneumonia shot. °· Learn and use tips on how to relax. °· Learn and use tips on how to control your breathing as told by your doctor. Try: °¨ Breathing in (inhaling) through your nose for 1 second. Then, pucker your lips and breath out (exhale) through your lips for 2 seconds. °¨ Putting one hand on your belly (abdomen). Breathe in slowly through your nose for 1 second. Your hand on your belly should move out. Pucker your lips and breathe out slowly through your lips. Your hand on your belly  should move in as you breathe out. °· Learn and use controlled coughing to clear thick spit from your lungs. The steps are: °1. Lean your head a little forward. °2. Breathe in deeply. °3. Try to hold your breath for 3 seconds. °4. Keep your mouth slightly open while coughing 2 times. °5. Spit any thick spit out into a tissue. °6. Rest and do the steps again 1 or 2 times as needed. °GET HELP IF: °· You cough up more thick spit than usual. °· There is a change in the color or thickness of the spit. °· It is harder to breathe than usual. °· Your breathing is faster than usual. °GET HELP RIGHT AWAY IF: °· You have shortness of breath while resting. °· You have shortness of breath that stops you from: °¨ Being able to talk. °¨ Doing normal activities. °· You chest hurts for longer than 5 minutes. °· Your skin color is more blue than usual. °· Your pulse oximeter shows that you have low oxygen for longer than 5 minutes. °MAKE SURE YOU: °· Understand these instructions. °· Will watch your condition. °· Will get help right away if you are not doing well or get worse. °  °This information is not intended to replace advice given to you by your health care provider. Make sure you discuss any questions you have with your health care provider. °  °Document Released: 07/23/2007 Document Revised: 02/24/2014 Document Reviewed: 09/30/2012 °Elsevier Interactive Patient   Education ©2016 [employer] ° °

## 2015-08-14 ENCOUNTER — Telehealth: Payer: Self-pay | Admitting: Cardiovascular Disease

## 2015-08-14 ENCOUNTER — Other Ambulatory Visit: Payer: Self-pay | Admitting: *Deleted

## 2015-08-14 NOTE — Telephone Encounter (Signed)
Contacted patient to schedule overdue fu.  [facility] and patient has not been seen by Dr. Kirke CorinArida since 2015.  Patient does not wish to be seen and says he does not need an appt at this time.  Patient says the med requested was for pre dental work and pcp has ordered for him.

## 2015-08-16 NOTE — Discharge Summary (Signed)
[facility]  at [facility]   PATIENT NAME: Adam PeakGeorge Ibarra    MR#:  [id number]  DATE OF BIRTH:  02/03/1951  DATE OF ADMISSION:  08/11/2015 ADMITTING PHYSICIAN: Gery Prayebby Crosley, MD  DATE OF DISCHARGE: 08/13/2015  4:34 PM  PRIMARY CARE PHYSICIAN: Lyndon CodeKHAN, FOZIA M, MD    ADMISSION DIAGNOSIS:  Community acquired pneumonia [J18.9] Acute on chronic respiratory failure with hypoxia (HCC) [J96.21]  DISCHARGE DIAGNOSIS:  Active Problems:   CAD (coronary artery disease)   Hyperlipidemia   Hypertension   Diabetes mellitus without complication (HCC)   S/P AVR (aortic valve replacement)   Chronic respiratory failure (HCC)   Pulmonary fibrosis (HCC)   OSA on CPAP   Sepsis (HCC)  SECONDARY DIAGNOSIS:   Past Medical History  Diagnosis Date  . Arthritis   . Hyperlipidemia   . Nephrolithiasis   . Type II diabetes mellitus (HCC)   . Aortic stenosis     a. 01/2012 s/p AVR with SJM 23mm mechanical valve.  . Obesity   . Coronary artery disease     a. 01/2012 s/p CABG x 3 (LIMA->LAD, VG->OM1,VG->PDA)  . Sleep apnea     a. on CPAP  . Hypertension   . Syncope     a. 01/2012 - presumed to be orthostatic in nature.  . Chronic venous insufficiency 05/24/2012    HOSPITAL COURSE:  65 year old gentleman with known history of pulmonary fibrosis, obstructive sleep apnea on CPAP and chronic respiratory failure admitted for worsening SOB.  1. Acute on chronic respiratory failure: due to COPD exacerbation. ,  Now improved.  We will need to 2 L oxygen at rest and on ambulation for now.   2. COPD exacerbation with pulmonary fibrosis 3. Sepsis ruled out. Pneumonia ruled out. 4. Aortic valve replacement on Coumadin. therapeutic INR  DISCHARGE CONDITIONS:  good CONSULTS OBTAINED:     DRUG ALLERGIES:   Allergies  Allergen Reactions  . Ivp Dye [Iodinated Diagnostic Agents] Itching    Can tolerate if takes benadryl    DISCHARGE MEDICATIONS:   Discharge  Medication List as of 08/13/2015  3:48 PM    START taking these medications   Details  azithromycin (ZITHROMAX) 500 MG tablet Take 1 tablet (500 mg total) by mouth daily., Starting 08/13/2015, Until Discontinued, Normal    furosemide (LASIX) 20 MG tablet Take 1 tablet (20 mg total) by mouth daily., Starting 08/13/2015, Until Discontinued, Normal    predniSONE (STERAPRED UNI-PAK 21 TAB) 10 MG (21) TBPK tablet Take 1 tablet (10 mg total) by mouth daily. Start 60 mg po daily, taper 10 mg daily until done, Starting 08/13/2015, Until Discontinued, Normal      CONTINUE these medications which have NOT CHANGED   Details  albuterol (PROVENTIL HFA;VENTOLIN HFA) 108 (90 BASE) MCG/ACT inhaler Inhale 2 puffs into the lungs every 4 (four) hours as needed for wheezing or shortness of breath., Until Discontinued, Historical Med    amitriptyline (ELAVIL) 25 MG tablet Take 75-100 mg by mouth at bedtime. , Until Discontinued, Historical Med    amLODipine (NORVASC) 2.5 MG tablet Take 2.5 mg by mouth daily., Starting 10/09/2013, Until Discontinued, Historical Med    aspirin EC 81 MG tablet Take 81 mg by mouth daily., Until Discontinued, Historical Med    atorvastatin (LIPITOR) 80 MG tablet Take 1 tablet (80 mg total) by mouth daily., Starting 08/09/2014, Until Discontinued, Normal    carvedilol (COREG) 25 MG tablet Take 25 mg by mouth 2 (two) times daily with a  meal., Until Discontinued, Historical Med    cyanocobalamin (,VITAMIN B-12,) 1000 MCG/ML injection Inject 1,000 mcg into the muscle every 30 (thirty) days., Until Discontinued, Historical Med    Dulaglutide (TRULICITY) 1.5 MG/0.5ML SOPN Inject 1.5 mg into the skin once a week., Until Discontinued, Historical Med    gabapentin (NEURONTIN) 100 MG capsule Take 100 mg by mouth at bedtime., Until Discontinued, Historical Med    glimepiride (AMARYL) 4 MG tablet Take 4 mg by mouth 2 (two) times daily with a meal. , Until Discontinued, Historical Med      hydrALAZINE (APRESOLINE) 10 MG tablet Take 10 mg by mouth 3 (three) times daily., Until Discontinued, Historical Med    Insulin Glargine (LANTUS SOLOSTAR) 100 UNIT/ML Solostar Pen Inject 5 Units into the skin at bedtime., Until Discontinued, Historical Med    warfarin (COUMADIN) 5 MG tablet TAKE AS DIRECTED BY ANTICOAGULATION CLINIC, Normal         DISCHARGE INSTRUCTIONS:    DIET:  Regular diet  DISCHARGE CONDITION:  Good  ACTIVITY:  Activity as tolerated  OXYGEN:  Home Oxygen: Yes.     Oxygen Delivery: 2-3 L oxygen at rest and also on ambulation for now  DISCHARGE LOCATION:  home   If you experience worsening of your admission symptoms, develop shortness of breath, life threatening emergency, suicidal or homicidal thoughts you must seek medical attention immediately by calling 911 or calling your MD immediately  if symptoms less severe.  You Must read complete instructions/literature along with all the possible adverse reactions/side effects for all the Medicines you take and that have been prescribed to you. Take any new Medicines after you have completely understood and accpet all the possible adverse reactions/side effects.   Please note  You were cared for by a hospitalist during your hospital stay. If you have any questions about your discharge medications or the care you received while you were in the hospital after you are discharged, you can call the unit and asked to speak with the hospitalist on call if the hospitalist that took care of you is not available. Once you are discharged, your primary care physician will handle any further medical issues. Please note that NO REFILLS for any discharge medications will be authorized once you are discharged, as it is imperative that you return to your primary care physician (or establish a relationship with a primary care physician if you do not have one) for your aftercare needs so that they can reassess your need for  medications and monitor your lab values.    On the day of Discharge:  VITAL SIGNS:  Blood pressure 117/60, pulse 76, temperature 97.5 F (36.4 C), temperature source Oral, resp. rate 20, height 6' (1.829 m), weight 116.121 kg (256 lb), SpO2 98 %. PHYSICAL EXAMINATION:  GENERAL:  65 y.o.-year-old patient lying in the bed with no acute distress.  EYES: Pupils equal, round, reactive to light and accommodation. No scleral icterus. Extraocular muscles intact.  HEENT: Head atraumatic, normocephalic. Oropharynx and nasopharynx clear.  NECK:  Supple, no jugular venous distention. No thyroid enlargement, no tenderness.  LUNGS: Normal breath sounds bilaterally, no wheezing, rales,rhonchi or crepitation. No use of accessory muscles of respiration.  CARDIOVASCULAR: S1, S2 normal. No murmurs, rubs, or gallops.  ABDOMEN: Soft, non-tender, non-distended. Bowel sounds present. No organomegaly or mass.  EXTREMITIES: No pedal edema, cyanosis, or clubbing.  NEUROLOGIC: Cranial nerves II through XII are intact. Muscle strength 5/5 in all extremities. Sensation intact. Gait not checked.  PSYCHIATRIC: The  patient is alert and oriented x 3.  SKIN: No obvious rash, lesion, or ulcer.  DATA REVIEW:   CBC  Recent Labs Lab 08/12/15 0500  WBC 12.7*  HGB 11.6*  HCT 34.6*  PLT 240    Chemistries   Recent Labs Lab 08/12/15 0025 08/12/15 0500  NA 136 138  K 4.7 4.1  CL 102 108  CO2 25 25  GLUCOSE 178* 134*  BUN 20 19  CREATININE 1.34* 1.27*  CALCIUM 8.6* 8.1*  AST 29  --   ALT 18  --   ALKPHOS 64  --   BILITOT 0.7  --    Management plans discussed with the patient, family and they are in agreement.  CODE STATUS: full code  TOTAL TIME TAKING CARE OF THIS PATIENT: 45 minutes.    [facility], Castulo Scarpelli M.D on 08/16/2015 at 5:49 PM  Between 7am to 6pm - Pager - [phone]  After 6pm go to [email or web address] - password [email or web address] [facility]  [facility]  Office  [phone]  CC: Primary care  physician; Lyndon Code, MD   Note: This dictation was prepared with Dragon dictation along with smaller phrase technology. Any transcriptional errors that result from this process are unintentional.

## 2015-08-17 ENCOUNTER — Telehealth: Payer: Self-pay | Admitting: Cardiovascular Disease

## 2015-08-17 LAB — CULTURE, BLOOD (ROUTINE X 2)
CULTURE: NO GROWTH
Culture: NO GROWTH

## 2015-08-17 NOTE — Telephone Encounter (Signed)
Called pt to schedule an appt that was past due, so his Atorvastatin could be refilled. Patient states he has a lot of Dr. Algie CofferAppointments due to being in the hospital, and he will let his PCP refill this.

## 2015-08-29 ENCOUNTER — Ambulatory Visit (INDEPENDENT_AMBULATORY_CARE_PROVIDER_SITE_OTHER): Payer: 59 | Admitting: *Deleted

## 2015-08-29 DIAGNOSIS — Z954 Presence of other heart-valve replacement: Secondary | ICD-10-CM

## 2015-08-29 DIAGNOSIS — Z5181 Encounter for therapeutic drug level monitoring: Secondary | ICD-10-CM

## 2015-08-29 DIAGNOSIS — Z952 Presence of prosthetic heart valve: Secondary | ICD-10-CM

## 2015-08-29 LAB — POCT INR: INR: 3.4

## 2015-08-31 ENCOUNTER — Encounter: Payer: Self-pay | Admitting: Cardiovascular Disease

## 2015-08-31 ENCOUNTER — Ambulatory Visit (INDEPENDENT_AMBULATORY_CARE_PROVIDER_SITE_OTHER): Payer: 59 | Admitting: Cardiovascular Disease

## 2015-08-31 VITALS — BP 160/70 | HR 72 | Ht 72.0 in | Wt 260.5 lb

## 2015-08-31 DIAGNOSIS — I359 Nonrheumatic aortic valve disorder, unspecified: Secondary | ICD-10-CM | POA: Diagnosis not present

## 2015-08-31 DIAGNOSIS — I2510 Atherosclerotic heart disease of native coronary artery without angina pectoris: Secondary | ICD-10-CM | POA: Diagnosis not present

## 2015-08-31 DIAGNOSIS — E785 Hyperlipidemia, unspecified: Secondary | ICD-10-CM

## 2015-08-31 NOTE — Progress Notes (Signed)
Cardiology Office Note   Date:  08/31/2015   ID:  Adam Ibarra, DOB [birth year], MRN [id number]  PCP:  Lyndon Code, MD  Cardiologist:   Lorine Bears, MD   Chief Complaint  Patient presents with  . other    follow up from [facility]; shortness of breath. Meds reviewed by the patient's medication list. "doing well."       History of Present Illness: Adam Ibarra is a 65 y.o. male who presents for a followup visit regarding coronary artery disease and aortic valve replacement. He is status post coronary artery bypass grafting x3 as well as placement of a [employer] mechanical aortic valve in December of 2013. He has been on Coumadin since and followed by our anticoagulation clinic. He is known to have hypertension and hyperlipidemia. Most recent echocardiogram in December 2015 showed an ejection fraction of 60-65%, normal mechanical aortic valve with a mean gradient of 8 mmHg. Pulmonary pressure was normal. He was hospitalized in June at [facility] for COPD exacerbation. He was discharged home on a course of prednisone, azithromycin and small dose furosemide. He reported fevers and chills at that time after dental cleaning. His BNP was 156. He was diagnosed with idiopathic pulmonary fibrosis 6 months ago and he has been on oxygen since then. He reports that his lung capacity was down to 30%. His blood pressure is elevated today but he reports normal readings at home. He denies any chest pain. He reports that the shortness of breath is improving.     Past Medical History  Diagnosis Date  . Arthritis   . Hyperlipidemia   . Nephrolithiasis   . Type II diabetes mellitus (HCC)   . Aortic stenosis     a. 01/2012 s/p AVR with SJM 23mm mechanical valve.  . Obesity   . Coronary artery disease     a. 01/2012 s/p CABG x 3 (LIMA->LAD, VG->OM1,VG->PDA)  . Sleep apnea     a. on CPAP  . Hypertension   . Syncope     a. 01/2012 - presumed to be orthostatic in nature.  . Chronic venous insufficiency  05/24/2012    Past Surgical History  Procedure Laterality Date  . Vasectomy    . Vasectomy reversal    . Appendectomy    . Cataract extraction, bilateral    . Coronary artery bypass graft  02/06/2012    Procedure: CORONARY ARTERY BYPASS GRAFTING (CABG);  Surgeon: Alleen Borne, MD;  Location: [facility] OR;  Service: Open Heart Surgery;  Laterality: N/A;  CABG x three,  using left internal mammary artery and right leg greater saphenous vein harvested endoscopically  . Aortic valve replacement  02/06/2012    Procedure: AORTIC VALVE REPLACEMENT (AVR);  Surgeon: Alleen Borne, MD;  Location: [facility] OR;  Service: Open Heart Surgery;  Laterality: N/A;  . Cardiac catheterization  01/23/2012    [facility]     Current Outpatient Prescriptions  Medication Sig Dispense Refill  . albuterol (PROVENTIL HFA;VENTOLIN HFA) 108 (90 BASE) MCG/ACT inhaler Inhale 2 puffs into the lungs every 4 (four) hours as needed for wheezing or shortness of breath.    Marland Kitchen amitriptyline (ELAVIL) 25 MG tablet Take 75-100 mg by mouth at bedtime.     Marland Kitchen amLODipine (NORVASC) 2.5 MG tablet Take 2.5 mg by mouth daily.    Marland Kitchen aspirin EC 81 MG tablet Take 81 mg by mouth daily.    Marland Kitchen atorvastatin (LIPITOR) 80 MG tablet Take 1 tablet (80 mg total)  by mouth daily. 90 tablet 3  . carvedilol (COREG) 25 MG tablet Take 25 mg by mouth 2 (two) times daily with a meal.    . cyanocobalamin (,VITAMIN B-12,) 1000 MCG/ML injection Inject 1,000 mcg into the muscle every 30 (thirty) days.    . Dulaglutide (TRULICITY) 1.5 MG/0.5ML SOPN Inject 1.5 mg into the skin once a week.    . furosemide (LASIX) 20 MG tablet Take 1 tablet (20 mg total) by mouth daily. 30 tablet 0  . gabapentin (NEURONTIN) 100 MG capsule Take 100 mg by mouth at bedtime.    Marland Kitchen glimepiride (AMARYL) 4 MG tablet Take 4 mg by mouth 2 (two) times daily with a meal.     . hydrALAZINE (APRESOLINE) 10 MG tablet Take 10 mg by mouth 2 (two) times daily.     . Insulin Glargine (LANTUS SOLOSTAR) 100 UNIT/ML  Solostar Pen Inject 5 Units into the skin at bedtime.    . potassium chloride (K-DUR) 10 MEQ tablet Take 10 mEq by mouth daily.    Marland Kitchen warfarin (COUMADIN) 5 MG tablet TAKE AS DIRECTED BY ANTICOAGULATION CLINIC (Patient taking differently: Take 2.5-5 mg by mouth as directed. Take 5 mg qd except Sundays and Weds. Take 2.5 mg) 90 tablet 1   No current facility-administered medications for this visit.    Allergies:   Ivp dye    Social History:  The patient  reports that he quit smoking about 37 years ago. His smoking use included Cigarettes. He has a 27 pack-year smoking history. He has never used smokeless tobacco. He reports that he does not drink alcohol or use illicit drugs.   Family History:  The patient's family history includes Heart disease in his brother and mother.    ROS:  Please see the history of present illness.   Otherwise, review of systems are positive for none.   All other systems are reviewed and negative.    PHYSICAL EXAM: VS:  BP 160/70 mmHg  Pulse 72  Ht 6' (1.829 m)  Wt 260 lb 8 oz (118.162 kg)  BMI 35.32 kg/m2 , BMI Body mass index is 35.32 kg/(m^2). GEN: Well nourished, well developed, in no acute distress HEENT: normal Neck: no JVD, carotid bruits, or masses Cardiac: RRR; no murmurs, rubs, or gallops,no edema Normal mechanical heart sounds  Respiratory:  clear to auscultation bilaterally with diminished breath sounds, normal work of breathing GI: soft, nontender, nondistended, + BS MS: no deformity or atrophy Skin: warm and dry, no rash Neuro:  Strength and sensation are intact Psych: euthymic mood, full affect   EKG:  EKG is  not ordered today.Recent EKG in the hospital showed sinus tachycardia with right bundle branch block and left anterior fascicular block  Recent Labs: 08/12/2015: ALT 18; B Natriuretic Peptide 156.0*; BUN 19; Creatinine, Ser 1.27*; Hemoglobin 11.6*; Platelets 240; Potassium 4.1; Sodium 138    Lipid Panel    Component Value Date/Time    CHOL 161 07/02/2012 0951   TRIG 116 07/02/2012 0951   HDL 37* 07/02/2012 0951   CHOLHDL 4.4 07/02/2012 0951   LDLCALC 101* 07/02/2012 0951      Wt Readings from Last 3 Encounters:  08/31/15 260 lb 8 oz (118.162 kg)  08/11/15 256 lb (116.121 kg)  01/03/14 263 lb 6.4 oz (119.477 kg)         ASSESSMENT AND PLAN:  1.  Coronary artery disease involving native coronary arteries without angina:Recent shortness of breath was likely due to flareup of his lung disease.  He denies any chest pain. Continue medical therapy.  2. Status post aortic valve replacement with a mechanical valve: Given his recent fevers and chills after dental cleaning and the fact that he has a mechanical heart valve, I requested an echocardiogram to ensure no vegetations. Most recent echocardiogram was in 2015. Continue anticoagulation with warfarin.  3. Essential hypertension: Blood pressure is elevated today but he reports normal readings at home. Continue to monitor.  4. Hyperlipidemia: He is currently on high dose atorvastatin. Recommend a target LDL of less than 70.      Disposition:   FU with me in 1 year  Signed,  Lorine BearsMuhammad Arida, MD  08/31/2015 4:35 PM    [facility]

## 2015-08-31 NOTE — Patient Instructions (Addendum)
Medication Instructions:  Your physician recommends that you continue on your current medications as directed. Please refer to the Current Medication list given to you today.   Labwork: none  Testing/Procedures: Your physician has requested that you have an echocardiogram. Echocardiography is a painless test that uses sound waves to create images of your heart. It provides your doctor with information about the size and shape of your heart and how well your heart's chambers and valves are working. This procedure takes approximately one hour. There are no restrictions for this procedure.    Follow-Up: Your physician wants you to follow-up in: one year with Dr. Arida.  You will receive a reminder letter in the mail two months in advance. If you don't receive a letter, please call our office to schedule the follow-up appointment.   Any Other Special Instructions Will Be Listed Below (If Applicable).     If you need a refill on your cardiac medications before your next appointment, please call your pharmacy.  Echocardiogram An echocardiogram, or echocardiography, uses sound waves (ultrasound) to produce an image of your heart. The echocardiogram is simple, painless, obtained within a short period of time, and offers valuable information to your health care provider. The images from an echocardiogram can provide information such as:  Evidence of coronary artery disease (CAD).  Heart size.  Heart muscle function.  Heart valve function.  Aneurysm detection.  Evidence of a past heart attack.  Fluid buildup around the heart.  Heart muscle thickening.  Assess heart valve function. LET YOUR HEALTH CARE PROVIDER KNOW ABOUT:  Any allergies you have.  All medicines you are taking, including vitamins, herbs, eye drops, creams, and over-the-counter medicines.  Previous problems you or members of your family have had with the use of anesthetics.  Any blood disorders you  have.  Previous surgeries you have had.  Medical conditions you have.  Possibility of pregnancy, if this applies. BEFORE THE PROCEDURE  No special preparation is needed. Eat and drink normally.  PROCEDURE   In order to produce an image of your heart, gel will be applied to your chest and a wand-like tool (transducer) will be moved over your chest. The gel will help transmit the sound waves from the transducer. The sound waves will harmlessly bounce off your heart to allow the heart images to be captured in real-time motion. These images will then be recorded.  You may need an IV to receive a medicine that improves the quality of the pictures. AFTER THE PROCEDURE You may return to your normal schedule including diet, activities, and medicines, unless your health care provider tells you otherwise.   This information is not intended to replace advice given to you by your health care provider. Make sure you discuss any questions you have with your health care provider.   Document Released: 02/01/2000 Document Revised: 02/24/2014 Document Reviewed: 10/11/2012 Elsevier Interactive Patient Education 2016 Elsevier Inc.  

## 2015-09-12 ENCOUNTER — Ambulatory Visit (INDEPENDENT_AMBULATORY_CARE_PROVIDER_SITE_OTHER): Payer: 59

## 2015-09-12 DIAGNOSIS — Z5181 Encounter for therapeutic drug level monitoring: Secondary | ICD-10-CM

## 2015-09-12 DIAGNOSIS — Z954 Presence of other heart-valve replacement: Secondary | ICD-10-CM | POA: Diagnosis not present

## 2015-09-12 DIAGNOSIS — Z952 Presence of prosthetic heart valve: Secondary | ICD-10-CM

## 2015-09-12 LAB — POCT INR: INR: 4.7

## 2015-09-21 ENCOUNTER — Other Ambulatory Visit: Payer: Self-pay

## 2015-09-21 ENCOUNTER — Ambulatory Visit (INDEPENDENT_AMBULATORY_CARE_PROVIDER_SITE_OTHER): Payer: 59

## 2015-09-21 DIAGNOSIS — I359 Nonrheumatic aortic valve disorder, unspecified: Secondary | ICD-10-CM

## 2015-09-26 ENCOUNTER — Ambulatory Visit (INDEPENDENT_AMBULATORY_CARE_PROVIDER_SITE_OTHER): Payer: 59

## 2015-09-26 DIAGNOSIS — Z952 Presence of prosthetic heart valve: Secondary | ICD-10-CM

## 2015-09-26 DIAGNOSIS — Z5181 Encounter for therapeutic drug level monitoring: Secondary | ICD-10-CM

## 2015-09-26 DIAGNOSIS — Z954 Presence of other heart-valve replacement: Secondary | ICD-10-CM

## 2015-09-26 LAB — POCT INR: INR: 1.8

## 2015-10-10 ENCOUNTER — Ambulatory Visit (INDEPENDENT_AMBULATORY_CARE_PROVIDER_SITE_OTHER): Payer: 59

## 2015-10-10 DIAGNOSIS — Z954 Presence of other heart-valve replacement: Secondary | ICD-10-CM | POA: Diagnosis not present

## 2015-10-10 DIAGNOSIS — Z5181 Encounter for therapeutic drug level monitoring: Secondary | ICD-10-CM

## 2015-10-10 DIAGNOSIS — Z952 Presence of prosthetic heart valve: Secondary | ICD-10-CM

## 2015-10-10 LAB — POCT INR: INR: 1.9

## 2015-11-07 ENCOUNTER — Ambulatory Visit (INDEPENDENT_AMBULATORY_CARE_PROVIDER_SITE_OTHER): Payer: 59 | Admitting: *Deleted

## 2015-11-07 DIAGNOSIS — Z952 Presence of prosthetic heart valve: Secondary | ICD-10-CM

## 2015-11-07 DIAGNOSIS — Z5181 Encounter for therapeutic drug level monitoring: Secondary | ICD-10-CM

## 2015-11-07 DIAGNOSIS — Z954 Presence of other heart-valve replacement: Secondary | ICD-10-CM

## 2015-11-07 LAB — POCT INR: INR: 1.8

## 2015-11-28 ENCOUNTER — Ambulatory Visit (INDEPENDENT_AMBULATORY_CARE_PROVIDER_SITE_OTHER): Payer: 59

## 2015-11-28 DIAGNOSIS — Z952 Presence of prosthetic heart valve: Secondary | ICD-10-CM | POA: Diagnosis not present

## 2015-11-28 DIAGNOSIS — Z5181 Encounter for therapeutic drug level monitoring: Secondary | ICD-10-CM | POA: Diagnosis not present

## 2015-11-28 LAB — POCT INR: INR: 1.7

## 2015-12-14 ENCOUNTER — Emergency Department: Payer: 59

## 2015-12-14 ENCOUNTER — Encounter: Payer: Self-pay | Admitting: Emergency Medicine

## 2015-12-14 ENCOUNTER — Inpatient Hospital Stay
Admission: EM | Admit: 2015-12-14 | Discharge: 2015-12-18 | DRG: 196 | Disposition: A | Discharge status: Home / Self Care | Payer: 59 | Attending: Internal Medicine | Admitting: Internal Medicine

## 2015-12-14 DIAGNOSIS — R778 Other specified abnormalities of plasma proteins: Secondary | ICD-10-CM

## 2015-12-14 DIAGNOSIS — Z79899 Other long term (current) drug therapy: Secondary | ICD-10-CM

## 2015-12-14 DIAGNOSIS — Z9981 Dependence on supplemental oxygen: Secondary | ICD-10-CM

## 2015-12-14 DIAGNOSIS — I25810 Atherosclerosis of coronary artery bypass graft(s) without angina pectoris: Secondary | ICD-10-CM | POA: Diagnosis not present

## 2015-12-14 DIAGNOSIS — I2510 Atherosclerotic heart disease of native coronary artery without angina pectoris: Secondary | ICD-10-CM | POA: Diagnosis present

## 2015-12-14 DIAGNOSIS — I129 Hypertensive chronic kidney disease with stage 1 through stage 4 chronic kidney disease, or unspecified chronic kidney disease: Secondary | ICD-10-CM | POA: Diagnosis present

## 2015-12-14 DIAGNOSIS — E669 Obesity, unspecified: Secondary | ICD-10-CM | POA: Diagnosis present

## 2015-12-14 DIAGNOSIS — N179 Acute kidney failure, unspecified: Secondary | ICD-10-CM | POA: Diagnosis present

## 2015-12-14 DIAGNOSIS — R0603 Acute respiratory distress: Secondary | ICD-10-CM

## 2015-12-14 DIAGNOSIS — G4730 Sleep apnea, unspecified: Secondary | ICD-10-CM | POA: Diagnosis present

## 2015-12-14 DIAGNOSIS — J9621 Acute and chronic respiratory failure with hypoxia: Secondary | ICD-10-CM | POA: Diagnosis present

## 2015-12-14 DIAGNOSIS — Z7901 Long term (current) use of anticoagulants: Secondary | ICD-10-CM

## 2015-12-14 DIAGNOSIS — E785 Hyperlipidemia, unspecified: Secondary | ICD-10-CM | POA: Diagnosis present

## 2015-12-14 DIAGNOSIS — Z87442 Personal history of urinary calculi: Secondary | ICD-10-CM | POA: Diagnosis not present

## 2015-12-14 DIAGNOSIS — R918 Other nonspecific abnormal finding of lung field: Secondary | ICD-10-CM | POA: Diagnosis not present

## 2015-12-14 DIAGNOSIS — R7989 Other specified abnormal findings of blood chemistry: Secondary | ICD-10-CM

## 2015-12-14 DIAGNOSIS — Z952 Presence of prosthetic heart valve: Secondary | ICD-10-CM | POA: Diagnosis not present

## 2015-12-14 DIAGNOSIS — M6281 Muscle weakness (generalized): Secondary | ICD-10-CM

## 2015-12-14 DIAGNOSIS — R0602 Shortness of breath: Secondary | ICD-10-CM | POA: Diagnosis present

## 2015-12-14 DIAGNOSIS — Z87891 Personal history of nicotine dependence: Secondary | ICD-10-CM | POA: Diagnosis not present

## 2015-12-14 DIAGNOSIS — E1122 Type 2 diabetes mellitus with diabetic chronic kidney disease: Secondary | ICD-10-CM | POA: Diagnosis present

## 2015-12-14 DIAGNOSIS — Z794 Long term (current) use of insulin: Secondary | ICD-10-CM

## 2015-12-14 DIAGNOSIS — Z7982 Long term (current) use of aspirin: Secondary | ICD-10-CM

## 2015-12-14 DIAGNOSIS — Z8249 Family history of ischemic heart disease and other diseases of the circulatory system: Secondary | ICD-10-CM

## 2015-12-14 DIAGNOSIS — Z6834 Body mass index (BMI) 34.0-34.9, adult: Secondary | ICD-10-CM

## 2015-12-14 DIAGNOSIS — J84112 Idiopathic pulmonary fibrosis: Principal | ICD-10-CM | POA: Diagnosis present

## 2015-12-14 DIAGNOSIS — R609 Edema, unspecified: Secondary | ICD-10-CM

## 2015-12-14 DIAGNOSIS — I21A1 Myocardial infarction type 2: Secondary | ICD-10-CM

## 2015-12-14 DIAGNOSIS — Z951 Presence of aortocoronary bypass graft: Secondary | ICD-10-CM | POA: Diagnosis not present

## 2015-12-14 DIAGNOSIS — T502X5A Adverse effect of carbonic-anhydrase inhibitors, benzothiadiazides and other diuretics, initial encounter: Secondary | ICD-10-CM | POA: Diagnosis present

## 2015-12-14 DIAGNOSIS — R748 Abnormal levels of other serum enzymes: Secondary | ICD-10-CM | POA: Diagnosis not present

## 2015-12-14 DIAGNOSIS — R042 Hemoptysis: Secondary | ICD-10-CM | POA: Diagnosis present

## 2015-12-14 DIAGNOSIS — E1140 Type 2 diabetes mellitus with diabetic neuropathy, unspecified: Secondary | ICD-10-CM | POA: Diagnosis present

## 2015-12-14 DIAGNOSIS — J9690 Respiratory failure, unspecified, unspecified whether with hypoxia or hypercapnia: Secondary | ICD-10-CM

## 2015-12-14 DIAGNOSIS — N183 Chronic kidney disease, stage 3 (moderate): Secondary | ICD-10-CM | POA: Diagnosis present

## 2015-12-14 LAB — BASIC METABOLIC PANEL
ANION GAP: 8 (ref 5–15)
BUN: 24 mg/dL — ABNORMAL HIGH (ref 6–20)
CALCIUM: 8.9 mg/dL (ref 8.9–10.3)
CHLORIDE: 100 mmol/L — AB (ref 101–111)
CO2: 28 mmol/L (ref 22–32)
Creatinine, Ser: 1.67 mg/dL — ABNORMAL HIGH (ref 0.61–1.24)
GFR calc non Af Amer: 41 mL/min — ABNORMAL LOW (ref >60)
GFR, EST AFRICAN AMERICAN: 48 mL/min — AB (ref >60)
Glucose, Bld: 140 mg/dL — ABNORMAL HIGH (ref 65–99)
POTASSIUM: 5 mmol/L (ref 3.5–5.1)
Sodium: 136 mmol/L (ref 135–145)

## 2015-12-14 LAB — CBC WITH DIFFERENTIAL/PLATELET
BASOS ABS: 0.1 10*3/uL (ref 0–0.1)
BASOS PCT: 1 %
Eosinophils Absolute: 0.3 10*3/uL (ref 0–0.7)
Eosinophils Relative: 2 %
HEMATOCRIT: 36.7 % — AB (ref 40.0–52.0)
HEMOGLOBIN: 12.2 g/dL — AB (ref 13.0–18.0)
LYMPHS PCT: 7 %
Lymphs Abs: 0.9 10*3/uL — ABNORMAL LOW (ref 1.0–3.6)
MCH: 28.6 pg (ref 26.0–34.0)
MCHC: 33.3 g/dL (ref 32.0–36.0)
MCV: 86.1 fL (ref 80.0–100.0)
Monocytes Absolute: 1.1 10*3/uL — ABNORMAL HIGH (ref 0.2–1.0)
Monocytes Relative: 9 %
NEUTROS ABS: 10.7 10*3/uL — AB (ref 1.4–6.5)
NEUTROS PCT: 81 %
Platelets: 232 10*3/uL (ref 150–440)
RBC: 4.26 MIL/uL — AB (ref 4.40–5.90)
RDW: 15.8 % — AB (ref 11.5–14.5)
WBC: 13.1 10*3/uL — AB (ref 3.8–10.6)

## 2015-12-14 LAB — TROPONIN I: TROPONIN I: 0.07 ng/mL — AB (ref <0.03)

## 2015-12-14 LAB — PROTIME-INR
INR: 3.69
Prothrombin Time: 37.5 seconds — ABNORMAL HIGH (ref 11.4–15.2)

## 2015-12-14 LAB — BRAIN NATRIURETIC PEPTIDE: B NATRIURETIC PEPTIDE 5: 312 pg/mL — AB (ref 0.0–100.0)

## 2015-12-14 MED ORDER — IOPAMIDOL (ISOVUE-370) INJECTION 76%
60.0000 mL | Freq: Once | INTRAVENOUS | Status: AC | PRN
  Administered 2015-12-14: 60 mL via INTRAVENOUS

## 2015-12-14 MED ORDER — ASPIRIN 81 MG PO CHEW
324.0000 mg | CHEWABLE_TABLET | Freq: Once | ORAL | Status: AC
  Administered 2015-12-14: 324 mg via ORAL
  Filled 2015-12-14: qty 4

## 2015-12-14 MED ORDER — DIPHENHYDRAMINE HCL 50 MG/ML IJ SOLN
50.0000 mg | Freq: Once | INTRAMUSCULAR | Status: AC
  Administered 2015-12-14: 50 mg via INTRAVENOUS
  Filled 2015-12-14: qty 1

## 2015-12-14 MED ORDER — HYDROCORTISONE NA SUCCINATE PF 100 MG IJ SOLR
200.0000 mg | Freq: Once | INTRAMUSCULAR | Status: AC
  Administered 2015-12-14: 200 mg via INTRAVENOUS
  Filled 2015-12-14: qty 4

## 2015-12-14 MED ORDER — DIPHENHYDRAMINE HCL 50 MG/ML IJ SOLN: 25.0000 mg | Freq: Once | INTRAMUSCULAR | Status: DC

## 2015-12-14 NOTE — ED Triage Notes (Signed)
Ems from home for sob that started yesterday. Pt on 5lnc at home . Pt arrived with 15 L NRB. sats 80's. Hx pulmonary fibrosis. Reports small amount bloody sputum. Pt on coumadin

## 2015-12-14 NOTE — ED Provider Notes (Signed)
Signout from Dr. Don PerkingVeronese in this 65 year old patient with hemoptysis as well as shortness of breath. Plan is to follow-up with CAT scan, angiography of the chest. Plan is to admit to the hospitalist pending the CT scan.  Physical Exam  BP 139/77   Pulse (!) 101   Temp 100.2 F (37.9 C) (Axillary)   Resp (!) 27   Ht 6' (1.829 m)   Wt 258 lb (117 kg)   SpO2 95%   BMI 34.99 kg/m  ----------------------------------------- 8:10 PM on 12/14/2015 -----------------------------------------   Physical Exam Patient tolerating the BiPAP well. ED Course  Procedures  MDM CT scan without pulmonary embolus. Signed out to Dr. Nemiah CommanderKalisetti.       Myrna Blazeravid Matthew Schaevitz, MD 12/14/15 2010

## 2015-12-14 NOTE — ED Notes (Signed)
Called wife Lafonda MossesDiana [phone] to give update on husband.  Wants to be told room when it becomes available.

## 2015-12-14 NOTE — ED Provider Notes (Signed)
[facility] Emergency Department Provider Note  ____________________________________________  Time seen: Approximately 1:22 PM  I have reviewed the triage vital signs and the nursing notes.   HISTORY  Chief Complaint Shortness of Breath   HPI Adam Ibarra is a 65 y.o. male with h/o aortic stenosis s/p AVR in 2013 with mechanical valve on coumadin, CAD s/p CABG in 2013, hypertension, hyperlipidemia, sleep apnea on CPAP, pulmonary fibrosis, type 2 diabetes who presents for evaluation of shortness of breath.  Patient reports the shortness of breath that started yesterday. SOB is worse with minimal exertion. He also endorses orthopnea since yesterday requiring two pillows to sleep which is new for him. He's been using his inhalers at home with no improvement. Also started to have a cough yesterday. This morning he noticed small amount of blood in his sputum that he quantifies as the size of a dime. No fever or chills. Yesterday patient reported that he had mild sharp right sided chest pain however that has resolved. He does have asymmetric swelling of his bilateral lower extremities which according to the patient is old since having his CABG. He does not have a history of heart failure. Patient is usually on 5 L nasal cannula at home. Per EMS he was found to be hypoxic at 55% on his 5 L. That improved on a nonrebreather at 15 L to 100%. Patient reports that his INR has been subtherapeutic and his PCP has been adjusting his warfarin dose. He denies prior h/o PE/DVT.  Past Medical History:  Diagnosis Date  . Aortic stenosis    a. 01/2012 s/p AVR with SJM 23mm mechanical valve.  . Arthritis   . Chronic venous insufficiency 05/24/2012  . Coronary artery disease    a. 01/2012 s/p CABG x 3 (LIMA->LAD, VG->OM1,VG->PDA)  . Hyperlipidemia   . Hypertension   . Nephrolithiasis   . Obesity   . Sleep apnea    a. on CPAP  . Syncope    a. 01/2012 - presumed to be orthostatic  in nature.  . Type II diabetes mellitus [facility])     Patient Active Problem List   Diagnosis Date Noted  . Chronic respiratory failure (HCC) 08/12/2015  . Pulmonary fibrosis (HCC) 08/12/2015  . OSA on CPAP 08/12/2015  . Sepsis (HCC) 08/12/2015  . Encounter for therapeutic drug monitoring 03/23/2013  . S/P AVR (aortic valve replacement) 05/24/2012  . Chronic venous insufficiency 05/24/2012  . CAD (coronary artery disease) 02/17/2012  . Hyperlipidemia   . Hypertension   . Diabetes mellitus without complication (HCC)   . Syncope 02/16/2012  . 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 Angina, class III (HCC) 01/20/2012    Past Surgical History:  Procedure Laterality Date  . AORTIC VALVE REPLACEMENT  02/06/2012   Procedure: AORTIC VALVE REPLACEMENT (AVR);  Surgeon: Alleen Borne, MD;  Location: [facility] OR;  Service: Open Heart Surgery;  Laterality: N/A;  . APPENDECTOMY    . CARDIAC CATHETERIZATION  01/23/2012   [facility]  . CATARACT EXTRACTION, BILATERAL    . CORONARY ARTERY BYPASS GRAFT  02/06/2012   Procedure: CORONARY ARTERY BYPASS GRAFTING (CABG);  Surgeon: Alleen Borne, MD;  Location: [facility] OR;  Service: Open Heart Surgery;  Laterality: N/A;  CABG x three,  using left internal mammary artery and right leg greater saphenous vein harvested endoscopically  . VASECTOMY    . VASECTOMY REVERSAL      Prior to Admission medications   Medication Sig Start Date End Date  Taking? Authorizing Provider  albuterol (PROVENTIL HFA;VENTOLIN HFA) 108 (90 BASE) MCG/ACT inhaler Inhale 2 puffs into the lungs every 4 (four) hours as needed for wheezing or shortness of breath.    Historical Provider, MD  amitriptyline (ELAVIL) 25 MG tablet Take 75-100 mg by mouth at bedtime.     Historical Provider, MD  amLODipine (NORVASC) 2.5 MG tablet Take 2.5 mg by mouth daily. 10/09/13   Historical Provider, MD  aspirin EC 81 MG tablet Take 81 mg by mouth daily.    Historical Provider, MD  atorvastatin (LIPITOR) 80 MG tablet Take 1 tablet  (80 mg total) by mouth daily. 08/09/14   Iran OuchMuhammad A Arida, MD  carvedilol (COREG) 25 MG tablet Take 25 mg by mouth 2 (two) times daily with a meal.    Historical Provider, MD  cyanocobalamin (,VITAMIN B-12,) 1000 MCG/ML injection Inject 1,000 mcg into the muscle every 30 (thirty) days.    Historical Provider, MD  Dulaglutide (TRULICITY) 1.5 MG/0.5ML SOPN Inject 1.5 mg into the skin once a week.    Historical Provider, MD  furosemide (LASIX) 20 MG tablet Take 1 tablet (20 mg total) by mouth daily. 08/13/15   Delfino LovettVipul Shah, MD  gabapentin (NEURONTIN) 100 MG capsule Take 100 mg by mouth at bedtime.    Historical Provider, MD  glimepiride (AMARYL) 4 MG tablet Take 4 mg by mouth 2 (two) times daily with a meal.     Historical Provider, MD  hydrALAZINE (APRESOLINE) 10 MG tablet Take 10 mg by mouth 2 (two) times daily.     Historical Provider, MD  Insulin Glargine (LANTUS SOLOSTAR) 100 UNIT/ML Solostar Pen Inject 5 Units into the skin at bedtime.    Historical Provider, MD  potassium chloride (K-DUR) 10 MEQ tablet Take 10 mEq by mouth daily.    Historical Provider, MD  warfarin (COUMADIN) 5 MG tablet TAKE AS DIRECTED BY ANTICOAGULATION CLINIC Patient taking differently: Take 2.5-5 mg by mouth as directed. Take 5 mg qd except Sundays and Weds. Take 2.5 mg 05/09/15   Iran OuchMuhammad A Arida, MD    Allergies Ivp dye [iodinated diagnostic agents]  Family History  Problem Relation Age of Onset  . Heart disease Mother   . Heart disease Brother     Social History Social History  Substance Use Topics  . Smoking status: Former Smoker    Packs/day: 3.00    Years: 9.00    Types: Cigarettes    Quit date: 02/02/1978  . Smokeless tobacco: Never Used  . Alcohol use No     Comment: SOCIAL    Review of Systems  Constitutional: Negative for fever. Eyes: Negative for visual changes. ENT: Negative for sore throat. Cardiovascular: + chest pain. Respiratory: + shortness of breath and orthopnea and  cough Gastrointestinal: Negative for abdominal pain, vomiting or diarrhea. Genitourinary: Negative for dysuria. Musculoskeletal: Negative for back pain. Skin: Negative for rash. Neurological: Negative for headaches, weakness or numbness.  ____________________________________________   PHYSICAL EXAM:  VITAL SIGNS: Vitals:   12/14/15 1320 12/14/15 1333  BP:  (!) 159/81  Pulse: (!) 110 (!) 110  Resp: (!) 28   Temp:  100.2 F (37.9 C)   Constitutional: Alert and oriented, in moderate respiratory distress.  HEENT:      Head: Normocephalic and atraumatic.         Eyes: Conjunctivae are normal. Sclera is non-icteric. EOMI. PERRL      Mouth/Throat: Mucous membranes are dry.       Neck: Supple with no signs of  meningismus. Cardiovascular: Tachycardic with regular rhythm. No murmurs, gallops, or rubs. 2+ symmetrical distal pulses are present in all extremities. No JVD. Respiratory: Increased work of breathing, tachypnea, satting 88% on 5 L which improved to 90% on 10 L nonrebreather. Using accessory muscles, speaking in 4 word sentences. Moving great air, bilateral crackles with no wheezing. Gastrointestinal: Soft, non tender, and non distended with positive bowel sounds. No rebound or guarding. Musculoskeletal: 1+ pitting edema on RLE, none on the LLE. Neurologic: Normal speech and language. Face is symmetric. Moving all extremities. No gross focal neurologic deficits are appreciated. Skin: Skin is warm, dry and intact. No rash noted. Psychiatric: Mood and affect are normal. Speech and behavior are normal.  ____________________________________________   LABS (all labs ordered are listed, but only abnormal results are displayed)  Labs Reviewed  CBC WITH DIFFERENTIAL/PLATELET - Abnormal; Notable for the following:       Result Value   WBC 13.1 (*)    RBC 4.26 (*)    Hemoglobin 12.2 (*)    HCT 36.7 (*)    RDW 15.8 (*)    Neutro Abs 10.7 (*)    Lymphs Abs 0.9 (*)    Monocytes  Absolute 1.1 (*)    All other components within normal limits  BASIC METABOLIC PANEL - Abnormal; Notable for the following:    Chloride 100 (*)    Glucose, Bld 140 (*)    BUN 24 (*)    Creatinine, Ser 1.67 (*)    GFR calc non Af Amer 41 (*)    GFR calc Af Amer 48 (*)    All other components within normal limits  PROTIME-INR - Abnormal; Notable for the following:    Prothrombin Time 37.5 (*)    All other components within normal limits  BRAIN NATRIURETIC PEPTIDE - Abnormal; Notable for the following:    B Natriuretic Peptide 312.0 (*)    All other components within normal limits  TROPONIN I - Abnormal; Notable for the following:    Troponin I 0.07 (*)    All other components within normal limits  BLOOD GAS, VENOUS - Abnormal; Notable for the following:    pCO2, Ven 64 (*)    pO2, Ven <31.0 (*)    Bicarbonate 31.5 (*)    Acid-Base Excess 3.4 (*)    All other components within normal limits   ____________________________________________  EKG  ED ECG REPORT I, Nita Sickle, the attending physician, personally viewed and interpreted this ECG.  Sinus tachycardia, rate of 113, right bundle branch block, normal QTC, right axis deviation, no ST elevations or depressions. Unchanged from prior. ____________________________________________  RADIOLOGY  CXR: Diffuse chronic interstitial opacities throughout the lungs compatible with pulmonary fibrosis. No definite acute process. No change. ____________________________________________   PROCEDURES  Procedure(s) performed: None Procedures Critical Care performed: yes  CRITICAL CARE Performed by: Nita Sickle  ?  Total critical care time: 35 min  Critical care time was exclusive of separately billable procedures and treating other patients.  Critical care was necessary to treat or prevent imminent or life-threatening deterioration.  Critical care was time spent personally by me on the following activities:  development of treatment plan with patient and/or surrogate as well as nursing, discussions with consultants, evaluation of patient's response to treatment, examination of patient, obtaining history from patient or surrogate, ordering and performing treatments and interventions, ordering and review of laboratory studies, ordering and review of radiographic studies, pulse oximetry and re-evaluation of patient's condition.  ____________________________________________   INITIAL  IMPRESSION / ASSESSMENT AND PLAN / ED COURSE  65 y.o. male with h/o aortic stenosis s/p AVR in 2013 with mechanical valve on coumadin, CAD s/p CABG in 2013, hypertension, hyperlipidemia, sleep apnea on CPAP, pulmonary fibrosis, type 2 diabetes who presents for evaluation of 2 days of progressively worsening shortness of breath, cough with small amount of blood, orthopnea, DOA. Patient with new increased oxygen requirement from 5L to 10L. patient is in moderate respiratory distress with bilateral crackles, moving great air, no wheezing. He does have asymmetric edema with 1+ pitting edema in the right lower extremity and none on the left. Patient placed on BiPAP for WOB. Ddx PE vs CHF vs PNA. No wheezing and moving good air.   Clinical Course  Comment By Time  Patient has a history of itching with IV contrast. Per radiology department patient has to undergo the 4 hour prep for the CT scan. I have ordered hydrocortisone and Benadryl. In the meantime will pursue Doppler studies of bilateral lower extremities to see if there is any evidence of DVT. patient has a supratherapeutic INR of 3.69. Troponin 0.07, will give full dose ASA. CXR with no evidence of PNA or pulmonary edema. Patient breathing more comfortably on BiPAP.  Nita Sickle, MD 10/27 1456  Doppler and CTA chest pending. Case discussed with Hospitalist Dr. Nemiah Commander. Care transferred to Dr. Pershing Proud. Nita Sickle, MD 10/27 [phone]    Pertinent labs & imaging results  that were available during my care of the patient were reviewed by me and considered in my medical decision making (see chart for details).    ____________________________________________   FINAL CLINICAL IMPRESSION(S) / ED DIAGNOSES  Final diagnoses:  Acute on chronic respiratory failure with hypoxia (HCC)  Non-ST elevation myocardial infarction (NSTEMI), type 2 (HCC)      NEW MEDICATIONS STARTED DURING THIS VISIT:  New Prescriptions   No medications on file     Note:  This document was prepared using Dragon voice recognition software and may include unintentional dictation errors.    Nita Sickle, MD 12/14/15 [phone]

## 2015-12-14 NOTE — ED Notes (Signed)
[name] [name] called to check on status of husband.  Wants to be called on status change.  [phone].

## 2015-12-14 NOTE — Progress Notes (Signed)
Transported pt to ct on bipap and returned to ed without incident.

## 2015-12-15 DIAGNOSIS — Z951 Presence of aortocoronary bypass graft: Secondary | ICD-10-CM

## 2015-12-15 DIAGNOSIS — J9621 Acute and chronic respiratory failure with hypoxia: Secondary | ICD-10-CM

## 2015-12-15 DIAGNOSIS — Z952 Presence of prosthetic heart valve: Secondary | ICD-10-CM

## 2015-12-15 DIAGNOSIS — I25810 Atherosclerosis of coronary artery bypass graft(s) without angina pectoris: Secondary | ICD-10-CM

## 2015-12-15 DIAGNOSIS — J84112 Idiopathic pulmonary fibrosis: Principal | ICD-10-CM

## 2015-12-15 DIAGNOSIS — R918 Other nonspecific abnormal finding of lung field: Secondary | ICD-10-CM

## 2015-12-15 DIAGNOSIS — R0603 Acute respiratory distress: Secondary | ICD-10-CM

## 2015-12-15 LAB — BLOOD GAS, VENOUS
Acid-Base Excess: 3.4 mmol/L — ABNORMAL HIGH (ref 0.0–2.0)
Bicarbonate: 31.5 mmol/L — ABNORMAL HIGH (ref 20.0–28.0)
PATIENT TEMPERATURE: 37
PCO2 VEN: 64 mmHg — AB (ref 44.0–60.0)
pH, Ven: 7.3 (ref 7.250–7.430)
pO2, Ven: <31 mmHg — CL (ref 32.0–45.0)

## 2015-12-15 LAB — CBC
HCT: 32.5 % — ABNORMAL LOW (ref 40.0–52.0)
HEMATOCRIT: 33.6 % — AB (ref 40.0–52.0)
Hemoglobin: 10.7 g/dL — ABNORMAL LOW (ref 13.0–18.0)
Hemoglobin: 11.2 g/dL — ABNORMAL LOW (ref 13.0–18.0)
MCH: 28.2 pg (ref 26.0–34.0)
MCH: 28.4 pg (ref 26.0–34.0)
MCHC: 32.7 g/dL (ref 32.0–36.0)
MCHC: 33.4 g/dL (ref 32.0–36.0)
MCV: 86.1 fL (ref 80.0–100.0)
MCV: 84.9 fL (ref 80.0–100.0)
PLATELETS: 205 10*3/uL (ref 150–440)
Platelets: 212 10*3/uL (ref 150–440)
RBC: 3.78 MIL/uL — AB (ref 4.40–5.90)
RBC: 3.96 MIL/uL — ABNORMAL LOW (ref 4.40–5.90)
RDW: 15.7 % — ABNORMAL HIGH (ref 11.5–14.5)
RDW: 15.5 % — AB (ref 11.5–14.5)
WBC: 11.3 10*3/uL — AB (ref 3.8–10.6)
WBC: 13.3 10*3/uL — ABNORMAL HIGH (ref 3.8–10.6)

## 2015-12-15 LAB — COMPREHENSIVE METABOLIC PANEL
ALT: 14 U/L — ABNORMAL LOW (ref 17–63)
ANION GAP: 5 (ref 5–15)
AST: 19 U/L (ref 15–41)
Albumin: 2.8 g/dL — ABNORMAL LOW (ref 3.5–5.0)
Alkaline Phosphatase: 53 U/L (ref 38–126)
BILIRUBIN TOTAL: 0.8 mg/dL (ref 0.3–1.2)
BUN: 32 mg/dL — ABNORMAL HIGH (ref 6–20)
CO2: 27 mmol/L (ref 22–32)
Calcium: 8.4 mg/dL — ABNORMAL LOW (ref 8.9–10.3)
Chloride: 104 mmol/L (ref 101–111)
Creatinine, Ser: 1.65 mg/dL — ABNORMAL HIGH (ref 0.61–1.24)
GFR calc Af Amer: 48 mL/min — ABNORMAL LOW (ref >60)
GFR, EST NON AFRICAN AMERICAN: 42 mL/min — AB (ref >60)
Glucose, Bld: 132 mg/dL — ABNORMAL HIGH (ref 65–99)
POTASSIUM: 5.1 mmol/L (ref 3.5–5.1)
Sodium: 136 mmol/L (ref 135–145)
TOTAL PROTEIN: 7.1 g/dL (ref 6.5–8.1)

## 2015-12-15 LAB — BASIC METABOLIC PANEL
ANION GAP: 5 (ref 5–15)
BUN: 31 mg/dL — ABNORMAL HIGH (ref 6–20)
CALCIUM: 8.2 mg/dL — AB (ref 8.9–10.3)
CHLORIDE: 102 mmol/L (ref 101–111)
CO2: 27 mmol/L (ref 22–32)
CREATININE: 1.7 mg/dL — AB (ref 0.61–1.24)
GFR calc non Af Amer: 40 mL/min — ABNORMAL LOW (ref >60)
GFR, EST AFRICAN AMERICAN: 47 mL/min — AB (ref >60)
Glucose, Bld: 150 mg/dL — ABNORMAL HIGH (ref 65–99)
Potassium: 4.9 mmol/L (ref 3.5–5.1)
SODIUM: 134 mmol/L — AB (ref 135–145)

## 2015-12-15 LAB — MRSA PCR SCREENING: MRSA BY PCR: NEGATIVE

## 2015-12-15 LAB — LIPID PANEL
CHOL/HDL RATIO: 3.7 ratio
CHOLESTEROL: 165 mg/dL (ref 0–200)
HDL: 45 mg/dL (ref >40)
LDL Cholesterol: 104 mg/dL — ABNORMAL HIGH (ref 0–99)
TRIGLYCERIDES: 82 mg/dL (ref <150)
VLDL: 16 mg/dL (ref 0–40)

## 2015-12-15 LAB — PHOSPHORUS: Phosphorus: 3.8 mg/dL (ref 2.5–4.6)

## 2015-12-15 LAB — TROPONIN I
TROPONIN I: 0.03 ng/mL — AB (ref <0.03)
Troponin I: 0.02 ng/mL (ref <0.03)

## 2015-12-15 LAB — GLUCOSE, CAPILLARY
GLUCOSE-CAPILLARY: 169 mg/dL — AB (ref 65–99)
GLUCOSE-CAPILLARY: 220 mg/dL — AB (ref 65–99)
Glucose-Capillary: 135 mg/dL — ABNORMAL HIGH (ref 65–99)
Glucose-Capillary: 212 mg/dL — ABNORMAL HIGH (ref 65–99)
Glucose-Capillary: 183 mg/dL — ABNORMAL HIGH (ref 65–99)

## 2015-12-15 LAB — MAGNESIUM: MAGNESIUM: 1.7 mg/dL (ref 1.7–2.4)

## 2015-12-15 LAB — PROTIME-INR
INR: 5.43
PROTHROMBIN TIME: 51.1 s — AB (ref 11.4–15.2)

## 2015-12-15 MED ORDER — ASPIRIN EC 81 MG PO TBEC
81.0000 mg | DELAYED_RELEASE_TABLET | Freq: Every day | ORAL | Status: DC
  Administered 2015-12-15 – 2015-12-18 (×4): 81 mg via ORAL
  Filled 2015-12-15 (×4): qty 1

## 2015-12-15 MED ORDER — ONDANSETRON HCL 4 MG PO TABS: 4.0000 mg | ORAL_TABLET | Freq: Four times a day (QID) | ORAL | Status: DC | PRN

## 2015-12-15 MED ORDER — ZOLPIDEM TARTRATE 5 MG PO TABS: 5.0000 mg | ORAL_TABLET | Freq: Every evening | ORAL | Status: DC | PRN

## 2015-12-15 MED ORDER — HYDRALAZINE HCL 10 MG PO TABS
10.0000 mg | ORAL_TABLET | Freq: Two times a day (BID) | ORAL | Status: DC
  Administered 2015-12-15 – 2015-12-18 (×8): 10 mg via ORAL
  Filled 2015-12-15 (×9): qty 1

## 2015-12-15 MED ORDER — BISACODYL 5 MG PO TBEC: 5.0000 mg | DELAYED_RELEASE_TABLET | Freq: Every day | ORAL | Status: DC | PRN

## 2015-12-15 MED ORDER — SODIUM CHLORIDE 0.9 % IV SOLN
INTRAVENOUS | Status: DC
  Administered 2015-12-15: 03:00:00 via INTRAVENOUS

## 2015-12-15 MED ORDER — ONDANSETRON HCL 4 MG/2ML IJ SOLN: 4.0000 mg | Freq: Four times a day (QID) | INTRAMUSCULAR | Status: DC | PRN

## 2015-12-15 MED ORDER — IPRATROPIUM-ALBUTEROL 0.5-2.5 (3) MG/3ML IN SOLN: 3.0000 mL | Freq: Four times a day (QID) | RESPIRATORY_TRACT | Status: DC | PRN

## 2015-12-15 MED ORDER — METHYLPREDNISOLONE SODIUM SUCC 125 MG IJ SOLR
80.0000 mg | Freq: Two times a day (BID) | INTRAMUSCULAR | Status: DC
Start: 2015-12-15 — End: 2015-12-16
  Administered 2015-12-15 – 2015-12-16 (×2): 80 mg via INTRAVENOUS
  Filled 2015-12-15 (×2): qty 2

## 2015-12-15 MED ORDER — WARFARIN - PHARMACIST DOSING INPATIENT
Freq: Every day | Status: DC
  Administered 2015-12-16: 17:00:00

## 2015-12-15 MED ORDER — METHYLPREDNISOLONE SODIUM SUCC 125 MG IJ SOLR
60.0000 mg | Freq: Four times a day (QID) | INTRAMUSCULAR | Status: DC
  Administered 2015-12-15 (×2): 60 mg via INTRAVENOUS
  Filled 2015-12-15 (×2): qty 2

## 2015-12-15 MED ORDER — GABAPENTIN 100 MG PO CAPS
100.0000 mg | ORAL_CAPSULE | Freq: Every day | ORAL | Status: DC
  Administered 2015-12-15 – 2015-12-17 (×4): 100 mg via ORAL
  Filled 2015-12-15 (×4): qty 1

## 2015-12-15 MED ORDER — FUROSEMIDE 10 MG/ML IJ SOLN
80.0000 mg | Freq: Once | INTRAMUSCULAR | Status: AC
Start: 2015-12-15 — End: 2015-12-15
  Administered 2015-12-15: 80 mg via INTRAVENOUS
  Filled 2015-12-15: qty 8

## 2015-12-15 MED ORDER — ORAL CARE MOUTH RINSE
15.0000 mL | Freq: Two times a day (BID) | OROMUCOSAL | Status: DC
  Administered 2015-12-15 (×2): 15 mL via OROMUCOSAL

## 2015-12-15 MED ORDER — INSULIN ASPART 100 UNIT/ML ~~LOC~~ SOLN
0.0000 [IU] | Freq: Every day | SUBCUTANEOUS | Status: DC
  Filled 2015-12-15: qty 5

## 2015-12-15 MED ORDER — CARVEDILOL 25 MG PO TABS
25.0000 mg | ORAL_TABLET | Freq: Two times a day (BID) | ORAL | Status: DC
  Administered 2015-12-15 – 2015-12-18 (×7): 25 mg via ORAL
  Filled 2015-12-15 (×7): qty 1

## 2015-12-15 MED ORDER — LEVOFLOXACIN IN D5W 500 MG/100ML IV SOLN
500.0000 mg | INTRAVENOUS | Status: DC
  Administered 2015-12-15 – 2015-12-16 (×2): 500 mg via INTRAVENOUS
  Filled 2015-12-15 (×2): qty 100

## 2015-12-15 MED ORDER — HYDROCODONE-ACETAMINOPHEN 5-325 MG PO TABS
1.0000 | ORAL_TABLET | ORAL | Status: DC | PRN
  Filled 2015-12-15: qty 1

## 2015-12-15 MED ORDER — MAGNESIUM CITRATE PO SOLN: 1.0000 | Freq: Once | ORAL | Status: DC | PRN

## 2015-12-15 MED ORDER — POTASSIUM CHLORIDE CRYS ER 10 MEQ PO TBCR: 10.0000 meq | EXTENDED_RELEASE_TABLET | Freq: Every day | ORAL | Status: DC

## 2015-12-15 MED ORDER — INSULIN ASPART 100 UNIT/ML ~~LOC~~ SOLN
0.0000 [IU] | Freq: Three times a day (TID) | SUBCUTANEOUS | Status: DC
  Administered 2015-12-15 (×2): 7 [IU] via SUBCUTANEOUS
  Administered 2015-12-15: 3 [IU] via SUBCUTANEOUS
  Administered 2015-12-16 (×2): 4 [IU] via SUBCUTANEOUS
  Administered 2015-12-16: 7 [IU] via SUBCUTANEOUS
  Administered 2015-12-17: 4 [IU] via SUBCUTANEOUS
  Administered 2015-12-17: 11 [IU] via SUBCUTANEOUS
  Administered 2015-12-17: 4 [IU] via SUBCUTANEOUS
  Administered 2015-12-18: 11 [IU] via SUBCUTANEOUS
  Administered 2015-12-18: 3 [IU] via SUBCUTANEOUS
  Filled 2015-12-15: qty 4
  Filled 2015-12-15: qty 3
  Filled 2015-12-15 (×2): qty 4
  Filled 2015-12-15: qty 3
  Filled 2015-12-15: qty 11
  Filled 2015-12-15: qty 4
  Filled 2015-12-15 (×2): qty 7
  Filled 2015-12-15: qty 11
  Filled 2015-12-15: qty 7

## 2015-12-15 MED ORDER — AMLODIPINE BESYLATE 5 MG PO TABS
2.5000 mg | ORAL_TABLET | Freq: Every day | ORAL | Status: DC
  Administered 2015-12-15 – 2015-12-18 (×4): 2.5 mg via ORAL
  Filled 2015-12-15 (×4): qty 1

## 2015-12-15 MED ORDER — SODIUM CHLORIDE 0.9% FLUSH
3.0000 mL | Freq: Two times a day (BID) | INTRAVENOUS | Status: DC
  Administered 2015-12-15 – 2015-12-17 (×7): 3 mL via INTRAVENOUS

## 2015-12-15 MED ORDER — AMITRIPTYLINE HCL 25 MG PO TABS
75.0000 mg | ORAL_TABLET | Freq: Every day | ORAL | Status: DC
  Administered 2015-12-15 – 2015-12-17 (×4): 75 mg via ORAL
  Filled 2015-12-15 (×3): qty 3
  Filled 2015-12-15: qty 4

## 2015-12-15 MED ORDER — INFLUENZA VAC SPLIT QUAD 0.5 ML IM SUSY
0.5000 mL | PREFILLED_SYRINGE | INTRAMUSCULAR | Status: AC
  Administered 2015-12-17: 0.5 mL via INTRAMUSCULAR
  Filled 2015-12-15: qty 0.5

## 2015-12-15 MED ORDER — ACETAMINOPHEN 325 MG PO TABS: 650.0000 mg | ORAL_TABLET | Freq: Four times a day (QID) | ORAL | Status: DC | PRN

## 2015-12-15 MED ORDER — ACETAMINOPHEN 650 MG RE SUPP: 650.0000 mg | Freq: Four times a day (QID) | RECTAL | Status: DC | PRN

## 2015-12-15 MED ORDER — CHLORHEXIDINE GLUCONATE 0.12 % MT SOLN
15.0000 mL | Freq: Two times a day (BID) | OROMUCOSAL | Status: DC
  Administered 2015-12-16 – 2015-12-18 (×4): 15 mL via OROMUCOSAL
  Filled 2015-12-15 (×5): qty 15

## 2015-12-15 MED ORDER — SENNOSIDES-DOCUSATE SODIUM 8.6-50 MG PO TABS
1.0000 | ORAL_TABLET | Freq: Every evening | ORAL | Status: DC | PRN
Start: 2015-12-15 — End: 2015-12-18
  Filled 2015-12-15: qty 1

## 2015-12-15 NOTE — Consult Note (Signed)
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 He is on chronic oxygen therapy @ 4-5 lpm by [place]. He was admitted 10/27 with one month of intermittent scant blood tinged sputum and with 24-48 hours of increased DOE without fever or CP. His sputum is otherwise described as clear. He has been treated with systemic steroids and NIPPV and feels markedly better but not back to his baseline. He is still requiring higher flow of O2 than his baseline  PAST MEDICAL HISTORY :  He  has a past medical history of Aortic stenosis; Arthritis; Chronic venous insufficiency (05/24/2012); Coronary artery disease; Hyperlipidemia; Hypertension; Nephrolithiasis; Obesity; Sleep apnea; Syncope; and Type II diabetes mellitus (HCC).  PAST SURGICAL HISTORY: He  has a past surgical history that includes Vasectomy; Vasectomy reversal; Appendectomy; Cataract extraction, bilateral; Coronary artery bypass graft (02/06/2012); Aortic valve replacement (02/06/2012); and Cardiac catheterization (01/23/2012).  Allergies  Allergen Reactions  . Ivp Dye [Iodinated Diagnostic Agents] Itching    Can tolerate if takes benadryl    No current facility-administered medications on file prior to encounter.    Current Outpatient Prescriptions on File Prior to Encounter  Medication Sig  . albuterol (PROVENTIL HFA;VENTOLIN HFA) 108 (90 BASE) MCG/ACT inhaler Inhale 2 puffs into the lungs every 4 (four) hours as needed for wheezing or shortness of breath.  Marland Kitchen amitriptyline (ELAVIL) 25 MG tablet Take 75-100 mg by mouth at bedtime.   Marland Kitchen amLODipine (NORVASC) 2.5 MG tablet Take 2.5 mg by mouth daily.  Marland Kitchen aspirin EC 81 MG  tablet Take 81 mg by mouth daily.  . carvedilol (COREG) 25 MG tablet Take 25 mg by mouth 2 (two) times daily with a meal.  . cyanocobalamin (,VITAMIN B-12,) 1000 MCG/ML injection Inject 1,000 mcg into the muscle every 30 (thirty) days. Every 1st day of the month  . Dulaglutide (TRULICITY) 1.5 MG/0.5ML SOPN Inject 1.5 mg into the skin once a week. sundays  . gabapentin (NEURONTIN) 100 MG capsule Take 100 mg by mouth at bedtime.  Marland Kitchen glimepiride (AMARYL) 4 MG tablet Take 4 mg by mouth 2 (two) times daily with a meal.   . hydrALAZINE (APRESOLINE) 10 MG tablet Take 10 mg by mouth 2 (two) times daily.   . potassium chloride (K-DUR) 10 MEQ tablet Take 10 mEq by mouth daily.  Marland Kitchen warfarin (COUMADIN) 5 MG tablet TAKE AS DIRECTED BY ANTICOAGULATION CLINIC (Patient taking differently: Take 2.5-5 mg by mouth as directed. Take 5 mg qd except Sundays and Weds. Take 2.5 mg)    FAMILY HISTORY:  His indicated that his mother is deceased. He indicated that his father is alive. He indicated that both of his brothers are alive.    SOCIAL HISTORY: He  reports that he quit smoking about 37 years ago. His smoking use included Cigarettes. He has a 27.00 pack-year smoking history. He has never used smokeless tobacco. He reports that he does not drink alcohol or use drugs.  REVIEW OF SYSTEMS:   No weight loss, night sweats, fevers, chills, fatigue, lassitude.  HEENT:   No headaches,  Difficulty swallowing,  Tooth/dental problems,  Sore throat,  No sneezing, itching, ear ache, nasal congestion, post nasal drip,  CV:  No chest pain,  Orthopnea, PND, dizziness, palpitations GI  No heartburn, indigestion, abdominal pain, nausea, vomiting, diarrhea, change in bowel habits, loss of appetite Resp: No shortness of breath with exertion or at rest.  No excess mucus, no productive cough,  No non-productive cough,  No coughing up of blood.  No change in color of mucus.  No wheezing.  No chest wall deformity Skin: no  rash or lesions. GU: no dysuria, change in color of urine, no urgency or frequency.  No flank pain. MS:  No joint pain or swelling.  No decreased range of motion.  No back pain. Psych:  No change in mood or affect. No depression or anxiety.  No memory loss.    VITAL SIGNS: BP 139/74 (BP Location: Left Arm)   Pulse 88   Temp 97.6 F (36.4 C) (Axillary)   Resp 15   Ht 6' (1.829 m)   Wt 256 lb 13.4 oz (116.5 kg)   SpO2 90%   BMI 34.83 kg/m   HEMODYNAMICS:    VENTILATOR SETTINGS: FiO2 (%):  [48.1 %-50 %] 48.1 %  INTAKE / OUTPUT: I/O last 3 completed shifts: In: 220 [I.V.:220] Out: -   PHYSICAL EXAMINATION: General: RASS 0, oriented, no overt distress Neuro: CNs intact, motor/sensory intact, DTRs symmetric HEENT: NCAT, no sclericterus Cardiovascular: reg, no M Lungs: diffuse crackles, no wheezes Abdomen: soft, NT, +BS Ext: symmetric pretibial edem  LABS:  BMET  Recent Labs Lab 12/14/15 1353 12/15/15 0131  NA 136 134*  K 5.0 4.9  CL 100* 102  CO2 28 27  BUN 24* 31*  CREATININE 1.67* 1.70*  GLUCOSE 140* 150*    Electrolytes  Recent Labs Lab 12/14/15 1353 12/15/15 0131  CALCIUM 8.9 8.2*  MG  --  1.7  PHOS  --  3.8    CBC  Recent Labs Lab 12/14/15 1353 12/15/15 0131 12/15/15 0659  WBC 13.1* 11.3* 13.3*  HGB 12.2* 10.7* 11.2*  HCT 36.7* 32.5* 33.6*  PLT 232 205 212    Coag's  Recent Labs Lab 12/14/15 1353 12/15/15 0131  INR 3.69 5.43*    Sepsis Markers No results for input(s): LATICACIDVEN, PROCALCITON, O2SATVEN in the last 168 hours.  ABG No results for input(s): PHART, PCO2ART, PO2ART in the last 168 hours.  Liver Enzymes No results for input(s): AST, ALT, ALKPHOS, BILITOT, ALBUMIN in the last 168 hours.  Cardiac Enzymes  Recent Labs Lab 12/14/15 1353 12/15/15 0131  TROPONINI 0.07* 0.03*    Glucose  Recent Labs Lab 12/15/15 0023  GLUCAP 169*    Imaging Ct Angio Chest Pe W And/or Wo Contrast  12/14/2015 IMPRESSION: No large central pulmonary embolus. Aortic and branch vessel atherosclerosis. COPD, bronchiectasis and interstitial fibrosis with superimposed ground-glass pulmonary opacities in a mosaic pattern suggesting small airway disease with admixed air trapping.  10/27 [place] Venous Img Lower Bilateral IMPRESSION: No evidence of DVT within either lower extremity.   CXR: NNF   ASSESSMENT: 1) Chronic hypoxemic respiratory failure due to severe baseline pulmonary fibrosis 2) Acute on chronic hypoxic respiratory failure - the exact etiology is unclear but in this setting, the differential diagnosis is usually one of the following.   A) superimposed edema,   B) superimposed infection,   C) airways disease, or  D) acute exacerbation of IPF 3) AKI/CKD  PLAN/REC: 1) continue supplemental O2 and PRN BiPAP to maintain SpO2 88-93% 2) Furosemide X 1 dose - monitor  renal panel 3) Empiric levofloxacin 4) Check PCT in AM 10/29 5) Continue systemic steroids - dosing schedule adjusted 6) follow CXR 7) Ultimately, he might be a candidate for Pirfenidone (Esbriet) or Nintedaneb (Ofev) both of which have been shown to have (modest) benefit in slowing progression of IPF. He is probably too advanced with too many co-morbidities to be a candidate for lung transplantation but referral could be considered in the future if/when he recovers from this current hospitalization 8) Limited code status is appropriate    Billy Fischeravid Nadja Lina, MD PCCM service Mobile [phone] Pager [phone]  12/15/2015, 10:19 AM

## 2015-12-15 NOTE — Progress Notes (Signed)
ANTICOAGULATION CONSULT NOTE -follow up Consult  Pharmacy Consult for warfarin Indication: AF/ mechanical AVR 2013  Allergies  Allergen Reactions  . Ivp Dye [Iodinated Diagnostic Agents] Itching    Can tolerate if takes benadryl    Patient Measurements: Height: 6' (182.9 cm) Weight: 256 lb 13.4 oz (116.5 kg) IBW/kg (Calculated) : 77.6 Heparin Dosing Weight:   Vital Signs: Temp: 97.6 F (36.4 C) (10/28 0800) Temp Source: Axillary (10/28 0800) BP: 135/69 (10/28 0900) Pulse Rate: 83 (10/28 0900)  Labs:  Recent Labs  12/14/15 1353 12/15/15 0131 12/15/15 0659  HGB 12.2* 10.7* 11.2*  HCT 36.7* 32.5* 33.6*  PLT 232 205 212  LABPROT 37.5* 51.1*  --   INR 3.69 5.43*  --   CREATININE 1.67* 1.70*  --   TROPONINI 0.07* 0.03*  --     Estimated Creatinine Clearance: 56.3 mL/min (by C-G formula based on SCr of 1.7 mg/dL (H)).   Medical History: Past Medical History:  Diagnosis Date  . Aortic stenosis    a. 01/2012 s/p AVR with SJM 23mm mechanical valve.  . Arthritis   . Chronic venous insufficiency 05/24/2012  . Coronary artery disease    a. 01/2012 s/p CABG x 3 (LIMA->LAD, VG->OM1,VG->PDA)  . Hyperlipidemia   . Hypertension   . Nephrolithiasis   . Obesity   . Sleep apnea    a. on CPAP  . Syncope    a. 01/2012 - presumed to be orthostatic in nature.  . Type II diabetes mellitus (HCC)     Medications:   Assessment: 65 yom cc SOB with PMH aortic stenosis sp AVR in 2013 on VKA. Recent VKA dose adjustments per PCP for subtherapeutic INR.  Per Med Rec: pt on Warfarin 5 mg Mon,Tues, Thur, Fri, Sat and Warfarin 2.5 mg Sun, Wed.   10/27 INR 3.69  Noted patient took home dose 10/27 at 1100 10/28 INR 5.43  Hold   Goal of Therapy:  INR 2-3 (for mechanical aortic valve per ACCP) Monitor platelets by anticoagulation protocol: Yes   Plan:  INR 5.43. Will hold Warfarin dose today 10/28. Patient on Levaquin. Will recheck INR with AM labs.   Remi Rester A,  Pharm.D., BCPS Clinical pharmacist 12/15/2015,9:40 AM

## 2015-12-15 NOTE — H&P (Signed)
[facility] - [place] @ [facility] Admission History and Physical Tonye Royalty, D.O.  ---------------------------------------------------------------------------------------------------------------------   PATIENT NAME: Adam Ibarra MR#: [id number] DATE OF BIRTH: Mar 01, 1950 DATE OF ADMISSION: 12/14/2015 PRIMARY CARE PHYSICIAN: Lyndon Code, MD  REQUESTING/REFERRING PHYSICIAN: ED Dr. Pershing Proud  CHIEF COMPLAINT: Chief Complaint  Patient presents with  . Shortness of Breath    HISTORY OF PRESENT ILLNESS: Adam Ibarra is a 65 y.o. male with a known history of Pulmonary fibrosis, coronary artery disease, diabetes presents to the emergency department complaining of shortness of breath.  Patient was in a usual state of health until this afternoon when he left work he describes sudden onset of shortness of breath after walking to his car. He states that he sat in his car and tried to catch his breath but he believes he lost consciousness. He lost continence of his urine, physical he woke up and drove home where his wife reported that he lost consciousness again. His symptoms of shortness of breath were not improved with his regular home medications. He reports associated cough productive of blood-tinged. Sputum but denies any sensitive fevers, chills, congestion. Due to his history of coronary fibrosis he wears 5 L of O2 via nasal cannula during the day and he wears BiPAP at night. This evening EMS found him to be hypoxic at 55% on his regular 5 L which improved to 100% on nonrebreather.  Otherwise there has been no change in status. Patient has been taking medication as prescribed and there has been no recent change in medication or diet.  There has been no recent illness, travel or sick contacts.    Patient denies fevers/chills, weakness, dizziness, chest pain, shortness of breath, N/V/C/D, abdominal pain, dysuria/frequency, changes in mental status.   EMS/ED COURSE:   Patient received  signed Medrol and DuoNebs and was placed on BiPAP in the emergency department  PAST MEDICAL HISTORY: Past Medical History:  Diagnosis Date  . Aortic stenosis    a. 01/2012 s/p AVR with SJM 23mm mechanical valve.  . Arthritis   . Chronic venous insufficiency 05/24/2012  . Coronary artery disease    a. 01/2012 s/p CABG x 3 (LIMA->LAD, VG->OM1,VG->PDA)  . Hyperlipidemia   . Hypertension   . Nephrolithiasis   . Obesity   . Sleep apnea    a. on CPAP  . Syncope    a. 01/2012 - presumed to be orthostatic in nature.  . Type II diabetes mellitus (HCC)       PAST SURGICAL HISTORY: Past Surgical History:  Procedure Laterality Date  . AORTIC VALVE REPLACEMENT  02/06/2012   Procedure: AORTIC VALVE REPLACEMENT (AVR);  Surgeon: Alleen Borne, MD;  Location: [facility] OR;  Service: Open Heart Surgery;  Laterality: N/A;  . APPENDECTOMY    . CARDIAC CATHETERIZATION  01/23/2012   [facility]  . CATARACT EXTRACTION, BILATERAL    . CORONARY ARTERY BYPASS GRAFT  02/06/2012   Procedure: CORONARY ARTERY BYPASS GRAFTING (CABG);  Surgeon: Alleen Borne, MD;  Location: [facility] OR;  Service: Open Heart Surgery;  Laterality: N/A;  CABG x three,  using left internal mammary artery and right leg greater saphenous vein harvested endoscopically  . VASECTOMY    . VASECTOMY REVERSAL        SOCIAL HISTORY: Social History  Substance Use Topics  . Smoking status: Former Smoker    Packs/day: 3.00    Years: 9.00    Types: Cigarettes    Quit date: 02/02/1978  . Smokeless tobacco: Never Used  .  Alcohol use No     Comment: SOCIAL      FAMILY HISTORY: Family History  Problem Relation Age of Onset  . Heart disease Mother   . Heart disease Brother      MEDICATIONS AT HOME: Prior to Admission medications   Medication Sig Start Date End Date Taking? Authorizing Provider  albuterol (PROVENTIL HFA;VENTOLIN HFA) 108 (90 BASE) MCG/ACT inhaler Inhale 2 puffs into the lungs every 4 (four) hours as needed for wheezing or  shortness of breath.   Yes Historical Provider, MD  amitriptyline (ELAVIL) 25 MG tablet Take 75-100 mg by mouth at bedtime.    Yes Historical Provider, MD  amLODipine (NORVASC) 2.5 MG tablet Take 2.5 mg by mouth daily. 10/09/13  Yes Historical Provider, MD  aspirin EC 81 MG tablet Take 81 mg by mouth daily.   Yes Historical Provider, MD  carvedilol (COREG) 25 MG tablet Take 25 mg by mouth 2 (two) times daily with a meal.   Yes Historical Provider, MD  cyanocobalamin (,VITAMIN B-12,) 1000 MCG/ML injection Inject 1,000 mcg into the muscle every 30 (thirty) days. Every 1st day of the month   Yes Historical Provider, MD  Dulaglutide (TRULICITY) 1.5 MG/0.5ML SOPN Inject 1.5 mg into the skin once a week. sundays   Yes Historical Provider, MD  Ferrous Fumarate-Vitamin C 65-125 MG TABS Take by mouth.   Yes Historical Provider, MD  gabapentin (NEURONTIN) 100 MG capsule Take 100 mg by mouth at bedtime.   Yes Historical Provider, MD  glimepiride (AMARYL) 4 MG tablet Take 4 mg by mouth 2 (two) times daily with a meal.    Yes Historical Provider, MD  hydrALAZINE (APRESOLINE) 10 MG tablet Take 10 mg by mouth 2 (two) times daily.    Yes Historical Provider, MD  potassium chloride (K-DUR) 10 MEQ tablet Take 10 mEq by mouth daily.   Yes Historical Provider, MD  warfarin (COUMADIN) 5 MG tablet TAKE AS DIRECTED BY ANTICOAGULATION CLINIC Patient taking differently: Take 2.5-5 mg by mouth as directed. Take 5 mg qd except Sundays and Weds. Take 2.5 mg 05/09/15  Yes Iran OuchMuhammad A Arida, MD      DRUG ALLERGIES: Allergies  Allergen Reactions  . Ivp Dye [Iodinated Diagnostic Agents] Itching    Can tolerate if takes benadryl     REVIEW OF SYSTEMS: CONSTITUTIONAL: No fatigue, weakness, fever, chills, weight gain/loss, headache EYES: No blurry or double vision. ENT: No tinnitus, postnasal drip, redness or soreness of the oropharynx. RESPIRATORY: Positive dyspnea, cough, hemoptysis. Negative wheeze. CARDIOVASCULAR: No  chest pain, orthopnea, palpitations, syncope. GASTROINTESTINAL: No nausea, vomiting, constipation, diarrhea, abdominal pain. No hematemesis, melena or hematochezia. GENITOURINARY: No dysuria, frequency, hematuria. ENDOCRINE: No polyuria or nocturia. No heat or cold intolerance. HEMATOLOGY: No anemia, bruising, bleeding. INTEGUMENTARY: No rashes, ulcers, lesions. MUSCULOSKELETAL: No pain, arthritis, swelling, gout. NEUROLOGIC: No numbness, tingling, weakness or ataxia. No seizure-type activity. PSYCHIATRIC: No anxiety, depression, insomnia.  PHYSICAL EXAMINATION: VITAL SIGNS: Blood pressure 113/79, pulse 91, temperature 97.9 F (36.6 C), temperature source Axillary, resp. rate 17, height 6' (1.829 m), weight 116.5 kg (256 lb 13.4 oz), SpO2 96 %.  GENERAL: 65 y.o.-year-old white male patient, well-developed, well-nourished lying in the bed in no acute distress.  Pleasant and cooperative.   HEENT: Head atraumatic, normocephalic. Pupils equal, round, reactive to light and accommodation. No scleral icterus. Extraocular muscles intact. Oropharynx is clear. Mucus membranes moist. NECK: Supple, full range of motion. No JVD, no bruit heard. No cervical lymphadenopathy. CHEST: Normal breath sounds bilaterally.  Diffuse wheezes, no rales, rhonchi or crackles. No use of accessory muscles of respiration.  No reproducible chest wall tenderness. Patient is resting comfortably on BiPAP CARDIOVASCULAR: S1, S2 normal. No murmurs, rubs, or gallops appreciated. Cap refill <2 seconds. ABDOMEN: Soft, nontender, nondistended. No rebound, guarding, rigidity. Normoactive bowel sounds present in all four quadrants. No organomegaly or mass. EXTREMITIES: Asymmetric pitting edema of the right lower extremity secondary to vein harvesting. Left lower shin May without edema. NEUROLOGIC: Cranial nerves II through XII are grossly intact with no focal sensorimotor deficit. Muscle strength 5/5 in all extremities. Sensation intact.  Gait not checked. PSYCHIATRIC: The patient is alert and oriented x 3. Normal affect, mood, thought content. SKIN: Warm, dry, and intact without obvious rash, lesion, or ulcer.  LABORATORY PANEL:  CBC  Recent Labs Lab 12/15/15 0131  WBC 11.3*  HGB 10.7*  HCT 32.5*  PLT 205   ----------------------------------------------------------------------------------------------------------------- Chemistries  Recent Labs Lab 12/14/15 1353  NA 136  K 5.0  CL 100*  CO2 28  GLUCOSE 140*  BUN 24*  CREATININE 1.67*  CALCIUM 8.9   ------------------------------------------------------------------------------------------------------------------ Cardiac Enzymes  Recent Labs Lab 12/14/15 1353  TROPONINI 0.07*   ------------------------------------------------------------------------------------------------------------------  RADIOLOGY: Ct Angio Chest Pe W And/or Wo Contrast  Result Date: 12/14/2015 CLINICAL DATA:  Pulmonary fibrosis with hypoxia and bloody sputum. EXAM: CT ANGIOGRAPHY CHEST WITH CONTRAST TECHNIQUE: Multidetector CT imaging of the chest was performed using the standard protocol during bolus administration of intravenous contrast. Multiplanar CT image reconstructions and MIPs were obtained to evaluate the vascular anatomy. CONTRAST:  60 mL IV Isovue 370 COMPARISON:  CT from 08/12/2015, CXR 12/14/2015 FINDINGS: Cardiovascular: The heart is mildly enlarged without pericardial effusion. There is coronary arteriosclerosis as well as a aortic and great vessel atherosclerosis. No aortic dissection. No large central pulmonary embolus. Mediastinum/Nodes: No visible thyroid nodules. No axillary nor supraclavicular lymphadenopathy. Small fat containing right upper paratracheal lymph nodes are noted measuring up to 16 mm short axis slightly more prominent than on prior. Subcarinal lymphadenopathy also noted measuring up to 15 mm short axis. Bilateral hilar lymphadenopathy is seen.  Findings may be reactive in etiology. The esophagus and tracheobronchial tree demonstrate no acute abnormalities. Lungs/Pleura: Mosaic attenuation of the lungs with scattered ground-glass opacities intermixed with areas of hyperlucent lung may represent changes secondary air trapping/COPD and small airway disease. Findings are superimposed on interstitial pulmonary fibrosis. No pleural effusion or pneumothorax. No pulmonary consolidations. Bronchiectasis. Upper Abdomen: The liver and spleen are unremarkable. The gallbladder is free of stones. There is colonic interposition of large bowel over the liver. The adrenal glands are unremarkable bilaterally. Musculoskeletal:  No acute nor suspicious osseous abnormalities. Review of the MIP images confirms the above findings. IMPRESSION: No large central pulmonary embolus. Aortic and branch vessel atherosclerosis. COPD, bronchiectasis and interstitial fibrosis with superimposed ground-glass pulmonary opacities in a mosaic pattern suggesting small airway disease with admixed air trapping. Electronically Signed   By: Tollie Eth M.D.   On: 12/14/2015 19:59   US Venous Img Lower Bilateral  Result Date: 12/14/2015 CLINICAL DATA:  Bilateral lower extremity edema.  Evaluate for DVT. EXAM: BILATERAL LOWER EXTREMITY VENOUS DOPPLER ULTRASOUND TECHNIQUE: Gray-scale sonography with graded compression, as well as color Doppler and duplex ultrasound were performed to evaluate the lower extremity deep venous systems from the level of the common femoral vein and including the common femoral, femoral, profunda femoral, popliteal and calf veins including the posterior tibial, peroneal and gastrocnemius veins when visible. The superficial great saphenous vein  was also interrogated. Spectral Doppler was utilized to evaluate flow at rest and with distal augmentation maneuvers in the common femoral, femoral and popliteal veins. COMPARISON:  None. FINDINGS: RIGHT LOWER EXTREMITY Common  Femoral Vein: No evidence of thrombus. Normal compressibility, respiratory phasicity and response to augmentation. Saphenofemoral Junction: No evidence of thrombus. Normal compressibility and flow on color Doppler imaging. Profunda Femoral Vein: No evidence of thrombus. Normal compressibility and flow on color Doppler imaging. Femoral Vein: No evidence of thrombus. Normal compressibility, respiratory phasicity and response to augmentation. Popliteal Vein: No evidence of thrombus. Normal compressibility, respiratory phasicity and response to augmentation. Calf Veins: No evidence of thrombus. Normal compressibility and flow on color Doppler imaging. Superficial Great Saphenous Vein: No evidence of thrombus. Normal compressibility and flow on color Doppler imaging. Venous Reflux:  None. Other Findings:  None. LEFT LOWER EXTREMITY Common Femoral Vein: No evidence of thrombus. Normal compressibility, respiratory phasicity and response to augmentation. Saphenofemoral Junction: No evidence of thrombus. Normal compressibility and flow on color Doppler imaging. Profunda Femoral Vein: No evidence of thrombus. Normal compressibility and flow on color Doppler imaging. Femoral Vein: No evidence of thrombus. Normal compressibility, respiratory phasicity and response to augmentation. Popliteal Vein: No evidence of thrombus. Normal compressibility, respiratory phasicity and response to augmentation. Calf Veins: No evidence of thrombus. Normal compressibility and flow on color Doppler imaging. Superficial Great Saphenous Vein: No evidence of thrombus. Normal compressibility and flow on color Doppler imaging. Venous Reflux:  None. Other Findings:  None. IMPRESSION: No evidence of DVT within either lower extremity. Electronically Signed   By: Simonne ComeJohn  Watts M.D.   On: 12/14/2015 15:31   Dg Chest Portable 1 View  Result Date: 12/14/2015 CLINICAL DATA:  Shortness of breath for 3 days. History of pulmonary fibrosis. EXAM: PORTABLE  CHEST 1 VIEW COMPARISON:  08/11/2015 FINDINGS: Diffuse opacities throughout the lungs are again noted, stable since prior study compatible with pulmonary fibrosis. No definite acute process. Prior CABG. Heart is normal size. No visible effusions or acute bony abnormality. IMPRESSION: Diffuse chronic interstitial opacities throughout the lungs compatible with pulmonary fibrosis. No definite acute process. No change. Electronically Signed   By: Charlett NoseKevin  Dover M.D.   On: 12/14/2015 13:42    EKG: Sinus tachycardia 113 bpm with rightward axis, right bundle blanch block and nonspecific ST-T wave changes.   IMPRESSION AND PLAN:  This is a 65 y.o. male with a history of Pulmonary fibrosis, coronary artery disease, diabetes, history of aortic stenosis status post valve replacement with mechanical valve on Coumadin, sleep apnea now being admitted with: 1. Acute on chronic respiratory failure with hypoxia, currently on BiPAP-admit to stepdown for oxygen therapy, steroids, nebulizers. Pulmonary consultation has been requested for comanagement. We'll follow up sputum cultures. 2. Acute kidney injury-gentle IV fluid hydration and recheck BMP in a.m. 3. Elevated troponin with nonischemic EKG -we'll monitor on telemetry, trend troponins. Patient already takes aspirin, Coreg. 4. History of diabetes-Accu-Cheks every 4 hours as patient will be nothing by mouth. 5. History of hypertension-continue Norvasc, Coreg, hydralazine 6. History of aortic valve replacement with therapeutic INR-continue Coumadin per pharmacy 7. History of diabetic neuropathy-continue Neurontin  Diet/Nutrition: Nothing by mouth while on BiPAP Fluids: Gentle IV fluid with normal saline DVT Px: Coumadin, SCDs and early ambulation Code Status: Patient is partial CODE STATUS. He would not want CPR but if he agrees to mechanical ventilation for potentially reversible condition such as pneumonia or respiratory failure.  All the records are reviewed  and case discussed with ED  provider. Management plans discussed with the patient and/or family who express understanding and agree with plan of care.   TOTAL TIME TAKING CARE OF THIS PATIENT: 60 minutes.   Ennio Houp D.O. on 12/15/2015 at 1:53 AM Between 7am to 6pm - Pager - [phone] After 6pm go to [email or web address] - Social research officer, government [facility] [place] [facility] Office [phone] CC: Primary care physician; Lyndon Code, MD     Note: This dictation was prepared with Dragon dictation along with smaller phrase technology. Any transcriptional errors that result from this process are unintentional.

## 2015-12-15 NOTE — Progress Notes (Signed)
[facility] [place] [facility]   PATIENT NAME: Adam Ibarra    MR#:  [id number]  DATE OF BIRTH:  02/01/1951  SUBJECTIVE:  Came in with increasing sob. On 70% HFNC  REVIEW OF SYSTEMS:   Review of Systems  Constitutional: Negative for chills, fever and weight loss.  HENT: Negative for ear discharge, ear pain and nosebleeds.   Eyes: Negative for blurred vision, pain and discharge.  Respiratory: Positive for shortness of breath. Negative for sputum production, wheezing and stridor.   Cardiovascular: Negative for chest pain, palpitations, orthopnea and PND.  Gastrointestinal: Negative for abdominal pain, diarrhea, nausea and vomiting.  Genitourinary: Negative for frequency and urgency.  Musculoskeletal: Negative for back pain and joint pain.  Neurological: Positive for weakness. Negative for sensory change, speech change and focal weakness.  Psychiatric/Behavioral: Negative for depression and hallucinations. The patient is not nervous/anxious.    Tolerating Diet:yes Tolerating PT: pending  DRUG ALLERGIES:   Allergies  Allergen Reactions  . Ivp Dye [Iodinated Diagnostic Agents] Itching    Can tolerate if takes benadryl    VITALS:  Blood pressure 139/70, pulse (!) 104, temperature 98.3 F (36.8 C), temperature source Axillary, resp. rate (!) 29, height 6' (1.829 m), weight 116.5 kg (256 lb 13.4 oz), SpO2 91 %.  PHYSICAL EXAMINATION:   Physical Exam  GENERAL:  65 y.o.-year-old patient lying in the bed with no acute distress.  EYES: Pupils equal, round, reactive to light and accommodation. No scleral icterus. Extraocular muscles intact.  HEENT: Head atraumatic, normocephalic. Oropharynx and nasopharynx clear. HFNC NECK:  Supple, no jugular venous distention. No thyroid enlargement, no tenderness.  LUNGS: distant breath sounds bilaterally, no wheezing, rales, rhonchi. No use of accessory muscles of respiration.  CARDIOVASCULAR: S1, S2 normal. No  murmurs, rubs, or gallops.  ABDOMEN: Soft, nontender, nondistended. Bowel sounds present. No organomegaly or mass.  EXTREMITIES: No cyanosis, clubbing or edema b/l.    NEUROLOGIC: Cranial nerves II through XII are intact. No focal Motor or sensory deficits b/l.   PSYCHIATRIC:  patient is alert and oriented x 3.  SKIN: No obvious rash, lesion, or ulcer.   LABORATORY PANEL:  CBC  Recent Labs Lab 12/15/15 0659  WBC 13.3*  HGB 11.2*  HCT 33.6*  PLT 212    Chemistries   Recent Labs Lab 12/15/15 0131 12/15/15 0659  NA 134* 136  K 4.9 5.1  CL 102 104  CO2 27 27  GLUCOSE 150* 132*  BUN 31* 32*  CREATININE 1.70* 1.65*  CALCIUM 8.2* 8.4*  MG 1.7  --   AST  --  19  ALT  --  14*  ALKPHOS  --  53  BILITOT  --  0.8   Cardiac Enzymes  Recent Labs Lab 12/15/15 1155  TROPONINI <0.03   RADIOLOGY:  Ct Angio Chest Pe W And/or Wo Contrast  Result Date: 12/14/2015 CLINICAL DATA:  Pulmonary fibrosis with hypoxia and bloody sputum. EXAM: CT ANGIOGRAPHY CHEST WITH CONTRAST TECHNIQUE: Multidetector CT imaging of the chest was performed using the standard protocol during bolus administration of intravenous contrast. Multiplanar CT image reconstructions and MIPs were obtained to evaluate the vascular anatomy. CONTRAST:  60 mL IV Isovue 370 COMPARISON:  CT from 08/12/2015, CXR 12/14/2015 FINDINGS: Cardiovascular: The heart is mildly enlarged without pericardial effusion. There is coronary arteriosclerosis as well as a aortic and great vessel atherosclerosis. No aortic dissection. No large central pulmonary embolus. Mediastinum/Nodes: No visible thyroid nodules. No axillary nor supraclavicular lymphadenopathy. Small fat  containing right upper paratracheal lymph nodes are noted measuring up to 16 mm short axis slightly more prominent than on prior. Subcarinal lymphadenopathy also noted measuring up to 15 mm short axis. Bilateral hilar lymphadenopathy is seen. Findings may be reactive in etiology.  The esophagus and tracheobronchial tree demonstrate no acute abnormalities. Lungs/Pleura: Mosaic attenuation of the lungs with scattered ground-glass opacities intermixed with areas of hyperlucent lung may represent changes secondary air trapping/COPD and small airway disease. Findings are superimposed on interstitial pulmonary fibrosis. No pleural effusion or pneumothorax. No pulmonary consolidations. Bronchiectasis. Upper Abdomen: The liver and spleen are unremarkable. The gallbladder is free of stones. There is colonic interposition of large bowel over the liver. The adrenal glands are unremarkable bilaterally. Musculoskeletal:  No acute nor suspicious osseous abnormalities. Review of the MIP images confirms the above findings. IMPRESSION: No large central pulmonary embolus. Aortic and branch vessel atherosclerosis. COPD, bronchiectasis and interstitial fibrosis with superimposed ground-glass pulmonary opacities in a mosaic pattern suggesting small airway disease with admixed air trapping. Electronically Signed   By: Tollie Eth M.D.   On: 12/14/2015 19:59   US Venous Img Lower Bilateral  Result Date: 12/14/2015 CLINICAL DATA:  Bilateral lower extremity edema.  Evaluate for DVT. EXAM: BILATERAL LOWER EXTREMITY VENOUS DOPPLER ULTRASOUND TECHNIQUE: Gray-scale sonography with graded compression, as well as color Doppler and duplex ultrasound were performed to evaluate the lower extremity deep venous systems from the level of the common femoral vein and including the common femoral, femoral, profunda femoral, popliteal and calf veins including the posterior tibial, peroneal and gastrocnemius veins when visible. The superficial great saphenous vein was also interrogated. Spectral Doppler was utilized to evaluate flow at rest and with distal augmentation maneuvers in the common femoral, femoral and popliteal veins. COMPARISON:  None. FINDINGS: RIGHT LOWER EXTREMITY Common Femoral Vein: No evidence of thrombus.  Normal compressibility, respiratory phasicity and response to augmentation. Saphenofemoral Junction: No evidence of thrombus. Normal compressibility and flow on color Doppler imaging. Profunda Femoral Vein: No evidence of thrombus. Normal compressibility and flow on color Doppler imaging. Femoral Vein: No evidence of thrombus. Normal compressibility, respiratory phasicity and response to augmentation. Popliteal Vein: No evidence of thrombus. Normal compressibility, respiratory phasicity and response to augmentation. Calf Veins: No evidence of thrombus. Normal compressibility and flow on color Doppler imaging. Superficial Great Saphenous Vein: No evidence of thrombus. Normal compressibility and flow on color Doppler imaging. Venous Reflux:  None. Other Findings:  None. LEFT LOWER EXTREMITY Common Femoral Vein: No evidence of thrombus. Normal compressibility, respiratory phasicity and response to augmentation. Saphenofemoral Junction: No evidence of thrombus. Normal compressibility and flow on color Doppler imaging. Profunda Femoral Vein: No evidence of thrombus. Normal compressibility and flow on color Doppler imaging. Femoral Vein: No evidence of thrombus. Normal compressibility, respiratory phasicity and response to augmentation. Popliteal Vein: No evidence of thrombus. Normal compressibility, respiratory phasicity and response to augmentation. Calf Veins: No evidence of thrombus. Normal compressibility and flow on color Doppler imaging. Superficial Great Saphenous Vein: No evidence of thrombus. Normal compressibility and flow on color Doppler imaging. Venous Reflux:  None. Other Findings:  None. IMPRESSION: No evidence of DVT within either lower extremity. Electronically Signed   By: Simonne Come M.D.   On: 12/14/2015 15:31   Dg Chest Portable 1 View  Result Date: 12/14/2015 CLINICAL DATA:  Shortness of breath for 3 days. History of pulmonary fibrosis. EXAM: PORTABLE CHEST 1 VIEW COMPARISON:  08/11/2015  FINDINGS: Diffuse opacities throughout the lungs are again noted, stable since  prior study compatible with pulmonary fibrosis. No definite acute process. Prior CABG. Heart is normal size. No visible effusions or acute bony abnormality. IMPRESSION: Diffuse chronic interstitial opacities throughout the lungs compatible with pulmonary fibrosis. No definite acute process. No change. Electronically Signed   By: Charlett NoseKevin  Dover M.D.   On: 12/14/2015 13:42   ASSESSMENT AND PLAN:   65 y.o. male with a history of Pulmonary fibrosis, coronary artery disease, diabetes, history of aortic stenosis status post valve replacement with mechanical valve on Coumadin, sleep apnea now being admitted with: 1. Acute on chronic respiratory failure with hypoxia due to severe IPF - currently off BiPAP -IV steroids, nebulizers. Pulmonary consultation has been requested for comanagement. We'll follow up sputum cultures. 2. Acute kidney injury-gentle IV fluid hydration and recheck BMP in a.m. 3. Elevated troponin with nonischemic EKG -we'll monitor on telemetry, trend troponins. Patient already takes aspirin, Coreg. 4. History of diabetes-Accu-Cheks every 4 hours as patient will be nothing by mouth. 5. History of hypertension-continue Norvasc, Coreg, hydralazine 6. History of aortic valve replacement with therapeutic INR-continue Coumadin per pharmacy 7. History of diabetic neuropathy-continue Neurontin Case discussed with Care Management/Social Worker. Management plans discussed with the patient, family and they are in agreement.  CODE STATUS: full  DVT Prophylaxis: lovenox TOTAL TIME TAKING CARE OF THIS PATIENT: 30 minutes.  >50% time spent on counselling and coordination of care  POSSIBLE D/C IN 2-3 DAYS, DEPENDING ON CLINICAL CONDITION.  Note: This dictation was prepared with Dragon dictation along with smaller phrase technology. Any transcriptional errors that result from this process are unintentional.  Mansi Tokar  M.D on 12/15/2015 at 2:52 PM  Between 7am to 6pm - Pager - [phone]  After 6pm go to [email or web address] - password [email or web address] [email or web address]  [facility]  Office  [phone]  CC: Primary care physician; Julien Nordmannimothy Gollan, MD

## 2015-12-15 NOTE — Progress Notes (Signed)
ANTICOAGULATION CONSULT NOTE - Initial Consult  Pharmacy Consult for warfarin Indication: AF/ mechanical AVR 2013  Allergies  Allergen Reactions  . Ivp Dye [Iodinated Diagnostic Agents] Itching    Can tolerate if takes benadryl    Patient Measurements: Height: 6' (182.9 cm) Weight: 256 lb 13.4 oz (116.5 kg) IBW/kg (Calculated) : 77.6 Heparin Dosing Weight:   Vital Signs: Temp: 97.9 F (36.6 C) (10/28 0000) Temp Source: Axillary (10/28 0000) BP: 153/79 (10/28 0000) Pulse Rate: 93 (10/28 0000)  Labs:  Recent Labs  12/14/15 1353  HGB 12.2*  HCT 36.7*  PLT 232  LABPROT 37.5*  INR 3.69  CREATININE 1.67*  TROPONINI 0.07*    Estimated Creatinine Clearance: 57.4 mL/min (by C-G formula based on SCr of 1.67 mg/dL (H)).   Medical History: Past Medical History:  Diagnosis Date  . Aortic stenosis    a. 01/2012 s/p AVR with SJM 23mm mechanical valve.  . Arthritis   . Chronic venous insufficiency 05/24/2012  . Coronary artery disease    a. 01/2012 s/p CABG x 3 (LIMA->LAD, VG->OM1,VG->PDA)  . Hyperlipidemia   . Hypertension   . Nephrolithiasis   . Obesity   . Sleep apnea    a. on CPAP  . Syncope    a. 01/2012 - presumed to be orthostatic in nature.  . Type II diabetes mellitus (HCC)     Medications:  Infusions:    Assessment: 65 yom cc SOB with PMH aortic stenosis sp AVR in 2013 on VKA. Recent VKA dose adjustments per PCP for subtherapeutic INR.   Goal of Therapy:  INR 2-3 (for mechanical aortic valve per ACCP) Monitor platelets by anticoagulation protocol: Yes   Plan:  INR 3.69 this afternoon. Will recheck INR with AM labs.  Carola FrostNathan A Ananda Sitzer, Pharm.D., BCPS Clinical pharmacist 12/15/2015,12:52 AM

## 2015-12-15 NOTE — Consult Note (Addendum)
Cardiology Consultation Note  Patient ID: Adam Ibarra, MRN: [id number], DOB/AGE: 66-09-1950 66 y.o. Admit date: 12/14/2015   Date of Consult: 12/15/2015 Primary Physician: Julien Nordmann, MD Primary Cardiologist: Dr. Kirke Corin  Chief Complaint: Respiratory distress Reason for Consult: Shortness or breath, chest tightness. Cough Physician requesting consult: Dr. Emmit Pomfret   HPI: 65 y.o. male  is a 65 y.o. male  With a history of coronary artery disease and mechanical aortic valve replacement,  status post coronary artery bypass grafting x3  December of 2013 on Coumadin,  hypertension and hyperlipidemia presenting with acute respiratory distress.  He was in a usual state of health until yesterday when he left work when he felt the acute onset of shortness of breath after walking to his car.  he sat in his car, tried to catch his breath, believes he may have  lost consciousness,  lost continence of his urine, woke up, drove home where his wife reported that he lost consciousness again. He reported cough productive of blood-tinged Sputum  At baseline,  he wears 5 L of O2 via nasal cannula during the day and he wears BiPAP at night.   EMS found him to be hypoxic oxygen saturation  55% on his regular 5 L which improved to 100% on nonrebreather.  Other past medical history reviewed  echocardiogram in December 2015 showed an ejection fraction of 60-65%, normal mechanical aortic valve with a mean gradient of 8 mmHg. Pulmonary pressure was normal.  hospitalized in June  2017 at [facility] for COPD exacerbation.  discharged home on a course of prednisone, azithromycin and small dose furosemide.   diagnosed with idiopathic pulmonary fibrosis  in early 2017  lung capacity was down to 30%.      Past Medical History:  Diagnosis Date  . Aortic stenosis    a. 01/2012 s/p AVR with SJM 23mm mechanical valve.  . Arthritis   . Chronic venous insufficiency 05/24/2012  . Coronary artery disease    a.  01/2012 s/p CABG x 3 (LIMA->LAD, VG->OM1,VG->PDA)  . Hyperlipidemia   . Hypertension   . Nephrolithiasis   . Obesity   . Sleep apnea    a. on CPAP  . Syncope    a. 01/2012 - presumed to be orthostatic in nature.  . Type II diabetes mellitus (HCC)       Most Recent Cardiac Studies: Previous echocardiogram with normal ejection fraction August 2017   Surgical History:  Past Surgical History:  Procedure Laterality Date  . AORTIC VALVE REPLACEMENT  02/06/2012   Procedure: AORTIC VALVE REPLACEMENT (AVR);  Surgeon: Alleen Borne, MD;  Location: [facility] OR;  Service: Open Heart Surgery;  Laterality: N/A;  . APPENDECTOMY    . CARDIAC CATHETERIZATION  01/23/2012   [facility]  . CATARACT EXTRACTION, BILATERAL    . CORONARY ARTERY BYPASS GRAFT  02/06/2012   Procedure: CORONARY ARTERY BYPASS GRAFTING (CABG);  Surgeon: Alleen Borne, MD;  Location: [facility] OR;  Service: Open Heart Surgery;  Laterality: N/A;  CABG x three,  using left internal mammary artery and right leg greater saphenous vein harvested endoscopically  . VASECTOMY    . VASECTOMY REVERSAL       Home Meds: Prior to Admission medications   Medication Sig Start Date End Date Taking? Authorizing Provider  albuterol (PROVENTIL HFA;VENTOLIN HFA) 108 (90 BASE) MCG/ACT inhaler Inhale 2 puffs into the lungs every 4 (four) hours as needed for wheezing or shortness of breath.   Yes Historical Provider, MD  amitriptyline (  ELAVIL) 25 MG tablet Take 75-100 mg by mouth at bedtime.    Yes Historical Provider, MD  amLODipine (NORVASC) 2.5 MG tablet Take 2.5 mg by mouth daily. 10/09/13  Yes Historical Provider, MD  aspirin EC 81 MG tablet Take 81 mg by mouth daily.   Yes Historical Provider, MD  carvedilol (COREG) 25 MG tablet Take 25 mg by mouth 2 (two) times daily with a meal.   Yes Historical Provider, MD  cyanocobalamin (,VITAMIN B-12,) 1000 MCG/ML injection Inject 1,000 mcg into the muscle every 30 (thirty) days. Every 1st day of the month   Yes  Historical Provider, MD  Dulaglutide (TRULICITY) 1.5 MG/0.5ML SOPN Inject 1.5 mg into the skin once a week. sundays   Yes Historical Provider, MD  Ferrous Fumarate-Vitamin C 65-125 MG TABS Take by mouth.   Yes Historical Provider, MD  gabapentin (NEURONTIN) 100 MG capsule Take 100 mg by mouth at bedtime.   Yes Historical Provider, MD  glimepiride (AMARYL) 4 MG tablet Take 4 mg by mouth 2 (two) times daily with a meal.    Yes Historical Provider, MD  hydrALAZINE (APRESOLINE) 10 MG tablet Take 10 mg by mouth 2 (two) times daily.    Yes Historical Provider, MD  potassium chloride (K-DUR) 10 MEQ tablet Take 10 mEq by mouth daily.   Yes Historical Provider, MD  warfarin (COUMADIN) 5 MG tablet TAKE AS DIRECTED BY ANTICOAGULATION CLINIC Patient taking differently: Take 2.5-5 mg by mouth as directed. Take 5 mg qd except Sundays and Weds. Take 2.5 mg 05/09/15  Yes Iran OuchMuhammad A Arida, MD    Inpatient Medications:  . amitriptyline  75-100 mg Oral QHS  . amLODipine  2.5 mg Oral Daily  . aspirin EC  81 mg Oral Daily  . carvedilol  25 mg Oral BID WC  . [START ON 12/16/2015] chlorhexidine  15 mL Mouth Rinse BID  . gabapentin  100 mg Oral QHS  . hydrALAZINE  10 mg Oral BID  . [START ON 12/16/2015] Influenza vac split quadrivalent PF  0.5 mL Intramuscular Tomorrow-1000  . insulin aspart  0-20 Units Subcutaneous TID WC  . insulin aspart  0-5 Units Subcutaneous QHS  . levofloxacin (LEVAQUIN) IV  500 mg Intravenous Q24H  . mouth rinse  15 mL Mouth Rinse q12n4p  . methylPREDNISolone (SOLU-MEDROL) injection  80 mg Intravenous Q12H  . sodium chloride flush  3 mL Intravenous Q12H  . Warfarin - Pharmacist Dosing Inpatient   Does not apply q1800      Allergies:  Allergies  Allergen Reactions  . Ivp Dye [Iodinated Diagnostic Agents] Itching    Can tolerate if takes benadryl    Social History   Social History  . Marital status: Married    Spouse name: N/A  . Number of children: N/A  . Years of education:  N/A   Occupational History  . Not on file.   Social History Main Topics  . Smoking status: Former Smoker    Packs/day: 3.00    Years: 9.00    Types: Cigarettes    Quit date: 02/02/1978  . Smokeless tobacco: Never Used  . Alcohol use No     Comment: SOCIAL  . Drug use: No  . Sexual activity: Not on file   Other Topics Concern  . Not on file   Social History Narrative  . Previous smoker as above, stopped several decades ago, no active drug use  Works for [employer], monitors the servers      Family History  Problem Relation Age of Onset  . Heart disease Mother   . Heart disease Brother      Review of Systems: Review of Systems  Constitutional: Negative.   Respiratory: Positive for shortness of breath.   Cardiovascular: Negative.   Gastrointestinal: Negative.   Musculoskeletal: Negative.   Neurological: Negative.   Psychiatric/Behavioral: Negative.   All other systems reviewed and are negative.   Labs: CBC  Recent Labs  12/14/15 1353 12/15/15 0131 12/15/15 0659  WBC 13.1* 11.3* 13.3*  NEUTROABS 10.7*  --   --   HGB 12.2* 10.7* 11.2*  HCT 36.7* 32.5* 33.6*  MCV 86.1 86.1 84.9  PLT 232 205 212   Basic Metabolic Panel  Recent Labs  12/15/15 0131 12/15/15 0659  NA 134* 136  K 4.9 5.1  CL 102 104  CO2 27 27  GLUCOSE 150* 132*  BUN 31* 32*  CREATININE 1.70* 1.65*  CALCIUM 8.2* 8.4*  MG 1.7  --   PHOS 3.8  --    Liver Function Tests  Recent Labs  12/15/15 0659  AST 19  ALT 14*  ALKPHOS 53  BILITOT 0.8  PROT 7.1  ALBUMIN 2.8*   No results for input(s): LIPASE, AMYLASE in the last 72 hours. Cardiac Enzymes  Recent Labs  12/15/15 0131 12/15/15 0659 12/15/15 1155  TROPONINI 0.03* 0.02 <0.03   BNP Invalid input(s): POCBNP D-Dimer No results for input(s): DDIMER in the last 72 hours. Hemoglobin A1C No results for input(s): HGBA1C in the last 72 hours. Fasting Lipid Panel  Recent Labs  12/15/15 0659  CHOL 165  HDL 45  LDLCALC 104*   TRIG 82  CHOLHDL 3.7   Thyroid Function Tests No results for input(s): TSH, T4TOTAL, T3FREE, THYROIDAB in the last 72 hours.  Invalid input(s): FREET3  Radiology/Studies:  Ct Angio Chest Pe W And/or Wo Contrast  Result Date: 12/14/2015 CLINICAL DATA:  Pulmonary fibrosis with hypoxia and bloody sputum. EXAM: CT ANGIOGRAPHY CHEST WITH CONTRAST TECHNIQUE: Multidetector CT imaging of the chest was performed using the standard protocol during bolus administration of intravenous contrast. Multiplanar CT image reconstructions and MIPs were obtained to evaluate the vascular anatomy. CONTRAST:  60 mL IV Isovue 370 COMPARISON:  CT from 08/12/2015, CXR 12/14/2015 FINDINGS: Cardiovascular: The heart is mildly enlarged without pericardial effusion. There is coronary arteriosclerosis as well as a aortic and great vessel atherosclerosis. No aortic dissection. No large central pulmonary embolus. Mediastinum/Nodes: No visible thyroid nodules. No axillary nor supraclavicular lymphadenopathy. Small fat containing right upper paratracheal lymph nodes are noted measuring up to 16 mm short axis slightly more prominent than on prior. Subcarinal lymphadenopathy also noted measuring up to 15 mm short axis. Bilateral hilar lymphadenopathy is seen. Findings may be reactive in etiology. The esophagus and tracheobronchial tree demonstrate no acute abnormalities. Lungs/Pleura: Mosaic attenuation of the lungs with scattered ground-glass opacities intermixed with areas of hyperlucent lung may represent changes secondary air trapping/COPD and small airway disease. Findings are superimposed on interstitial pulmonary fibrosis. No pleural effusion or pneumothorax. No pulmonary consolidations. Bronchiectasis. Upper Abdomen: The liver and spleen are unremarkable. The gallbladder is free of stones. There is colonic interposition of large bowel over the liver. The adrenal glands are unremarkable bilaterally. Musculoskeletal:  No acute nor  suspicious osseous abnormalities. Review of the MIP images confirms the above findings. IMPRESSION: No large central pulmonary embolus. Aortic and branch vessel atherosclerosis. COPD, bronchiectasis and interstitial fibrosis with superimposed ground-glass pulmonary opacities in a mosaic pattern suggesting small airway disease with admixed  air trapping. Electronically Signed   By: Tollie Eth M.D.   On: 12/14/2015 19:59   US Venous Img Lower Bilateral  Result Date: 12/14/2015 CLINICAL DATA:  Bilateral lower extremity edema.  Evaluate for DVT. EXAM: BILATERAL LOWER EXTREMITY VENOUS DOPPLER ULTRASOUND TECHNIQUE: Gray-scale sonography with graded compression, as well as color Doppler and duplex ultrasound were performed to evaluate the lower extremity deep venous systems from the level of the common femoral vein and including the common femoral, femoral, profunda femoral, popliteal and calf veins including the posterior tibial, peroneal and gastrocnemius veins when visible. The superficial great saphenous vein was also interrogated. Spectral Doppler was utilized to evaluate flow at rest and with distal augmentation maneuvers in the common femoral, femoral and popliteal veins. COMPARISON:  None. FINDINGS: RIGHT LOWER EXTREMITY Common Femoral Vein: No evidence of thrombus. Normal compressibility, respiratory phasicity and response to augmentation. Saphenofemoral Junction: No evidence of thrombus. Normal compressibility and flow on color Doppler imaging. Profunda Femoral Vein: No evidence of thrombus. Normal compressibility and flow on color Doppler imaging. Femoral Vein: No evidence of thrombus. Normal compressibility, respiratory phasicity and response to augmentation. Popliteal Vein: No evidence of thrombus. Normal compressibility, respiratory phasicity and response to augmentation. Calf Veins: No evidence of thrombus. Normal compressibility and flow on color Doppler imaging. Superficial Great Saphenous Vein: No  evidence of thrombus. Normal compressibility and flow on color Doppler imaging. Venous Reflux:  None. Other Findings:  None. LEFT LOWER EXTREMITY Common Femoral Vein: No evidence of thrombus. Normal compressibility, respiratory phasicity and response to augmentation. Saphenofemoral Junction: No evidence of thrombus. Normal compressibility and flow on color Doppler imaging. Profunda Femoral Vein: No evidence of thrombus. Normal compressibility and flow on color Doppler imaging. Femoral Vein: No evidence of thrombus. Normal compressibility, respiratory phasicity and response to augmentation. Popliteal Vein: No evidence of thrombus. Normal compressibility, respiratory phasicity and response to augmentation. Calf Veins: No evidence of thrombus. Normal compressibility and flow on color Doppler imaging. Superficial Great Saphenous Vein: No evidence of thrombus. Normal compressibility and flow on color Doppler imaging. Venous Reflux:  None. Other Findings:  None. IMPRESSION: No evidence of DVT within either lower extremity. Electronically Signed   By: Simonne Come M.D.   On: 12/14/2015 15:31   Dg Chest Portable 1 View  Result Date: 12/14/2015 CLINICAL DATA:  Shortness of breath for 3 days. History of pulmonary fibrosis. EXAM: PORTABLE CHEST 1 VIEW COMPARISON:  08/11/2015 FINDINGS: Diffuse opacities throughout the lungs are again noted, stable since prior study compatible with pulmonary fibrosis. No definite acute process. Prior CABG. Heart is normal size. No visible effusions or acute bony abnormality. IMPRESSION: Diffuse chronic interstitial opacities throughout the lungs compatible with pulmonary fibrosis. No definite acute process. No change. Electronically Signed   By: Charlett Nose M.D.   On: 12/14/2015 13:42    EKG: Normal sinus rhythm with right bundle branch block, left anterior fascicular block  EKG lab work, chest x-ray reviewed independently by myself  Weights: Filed Weights   12/14/15 1336 12/15/15  0000  Weight: 258 lb (117 kg) 256 lb 13.4 oz (116.5 kg)     Physical Exam:Telemetry showing normal sinus rhythm, rate in the 90s Blood pressure (!) 143/69, pulse 94, temperature 98.3 F (36.8 C), temperature source Axillary, resp. rate (!) 26, height 6' (1.829 m), weight 256 lb 13.4 oz (116.5 kg), SpO2 96 %. Body mass index is 34.83 kg/m. GEN: Well nourished, well developed, in no acute distress. High flow nasal cannula oxygen in place,  HEENT:  Grossly normal.  Neck: Supple, no JVD, carotid bruits, or masses. Cardiac: RRR, no murmurs, rubs, or gallops. No clubbing, cyanosis, edema.  Radials/DP/PT 2+ and equal bilaterally.  Respiratory: Decreased breath sounds throughout, coarse rales bilaterally GI: Soft, nontender, nondistended, BS + x 4. MS: no deformity or atrophy. Skin: warm and dry, no rash. Neuro:  Strength and sensation are intact. Psych: AAOx3.  Normal affect.    Assessment and Plan:  1) acute respiratory distress Secondary to idiopathic pulmonary fibrosis, exacerbation Unclear if there is exacerbation from underlying infection, component of acute diastolic CHF Currently feels improved on Lasix, high flow nasal cannula oxygen, antibiotics --Case discussed with pulmonary, Dr. Sung AmabileSimonds Patient has advanced disease, currently not on therapy -Agree with holding IV fluids. Discussed with nursing. Saline was going at 100 cc/h If shortness of breath gets worse, would give extra IV furosemide  2) idiopathic pulmonary fibrosis Has not been evaluated for transplant in the past, managed by Dr. Welton FlakesKhan, pulmonary , as an outpatient Currently not on therapy, Given underlying coronary disease, mechanical aortic valve, bypass surgery, may not be a good transplant candidate though certainly should be considered and evaluated for transplant at tertiary care facility  3)   Coronary artery disease involving native coronary arteries without angina: Currently with no symptoms of angina. No  further workup at this time. Continue current medication regimen. Normal cardiac enzymes despite acute respiratory distress  4) . Status post aortic valve replacement with a mechanical valve:  Previous echocardiogram showing well-functioning mechanical aortic valve  5) . Essential hypertension:   6) . Hyperlipidemia:  Continue  atorvastatin. Recommend a target LDL of less than 70.  7) chronic renal failure Likely secondary to diuresis as an outpatient previously with normal renal function 4 months ago prior to start of Lasix    Total encounter time more than 110 minutes  Greater than 50% was spent in counseling and coordination of care with the patient   Signed, Dossie Arbourim Gollan, MD, Ph.D [facility] 12/15/2015

## 2015-12-15 NOTE — Progress Notes (Signed)
Transported pt to ICU on BIpap without incident.

## 2015-12-15 NOTE — Progress Notes (Signed)
Per Dr Sung AmabileSimonds it is ok for O2 sats to be 87%, pt off bipap and  on 6 liters rt now  Sats ok at first but pt appears to be desatting now, no s/sx distress, but sats are low-mid 80s.  Will attempt hi flo, OK per Dr Sung AmabileSimonds

## 2015-12-16 ENCOUNTER — Inpatient Hospital Stay: Payer: 59

## 2015-12-16 LAB — CBC
HEMATOCRIT: 33.9 % — AB (ref 40.0–52.0)
Hemoglobin: 11.2 g/dL — ABNORMAL LOW (ref 13.0–18.0)
MCH: 28.4 pg (ref 26.0–34.0)
MCHC: 33 g/dL (ref 32.0–36.0)
MCV: 86.1 fL (ref 80.0–100.0)
Platelets: 264 10*3/uL (ref 150–440)
RBC: 3.94 MIL/uL — ABNORMAL LOW (ref 4.40–5.90)
RDW: 15.6 % — AB (ref 11.5–14.5)
WBC: 21 10*3/uL — AB (ref 3.8–10.6)

## 2015-12-16 LAB — GLUCOSE, CAPILLARY
GLUCOSE-CAPILLARY: 194 mg/dL — AB (ref 65–99)
Glucose-Capillary: 245 mg/dL — ABNORMAL HIGH (ref 65–99)
Glucose-Capillary: 186 mg/dL — ABNORMAL HIGH (ref 65–99)
Glucose-Capillary: 155 mg/dL — ABNORMAL HIGH (ref 65–99)

## 2015-12-16 LAB — EXPECTORATED SPUTUM ASSESSMENT W REFEX TO RESP CULTURE: SPECIAL REQUESTS: NORMAL

## 2015-12-16 LAB — EXPECTORATED SPUTUM ASSESSMENT W GRAM STAIN, RFLX TO RESP C

## 2015-12-16 LAB — BASIC METABOLIC PANEL
Anion gap: 8 (ref 5–15)
BUN: 57 mg/dL — AB (ref 6–20)
CHLORIDE: 102 mmol/L (ref 101–111)
CO2: 29 mmol/L (ref 22–32)
CREATININE: 2.15 mg/dL — AB (ref 0.61–1.24)
Calcium: 8.3 mg/dL — ABNORMAL LOW (ref 8.9–10.3)
GFR calc Af Amer: 35 mL/min — ABNORMAL LOW (ref >60)
GFR calc non Af Amer: 30 mL/min — ABNORMAL LOW (ref >60)
Glucose, Bld: 193 mg/dL — ABNORMAL HIGH (ref 65–99)
POTASSIUM: 5.3 mmol/L — AB (ref 3.5–5.1)
SODIUM: 139 mmol/L (ref 135–145)

## 2015-12-16 LAB — PROTIME-INR
INR: 4.88 — AB
Prothrombin Time: 46.9 seconds — ABNORMAL HIGH (ref 11.4–15.2)

## 2015-12-16 LAB — HEMOGLOBIN A1C
HEMOGLOBIN A1C: 6.1 % — AB (ref 4.8–5.6)
MEAN PLASMA GLUCOSE: 128 mg/dL

## 2015-12-16 LAB — PROCALCITONIN: Procalcitonin: 0.21 ng/mL

## 2015-12-16 MED ORDER — FUROSEMIDE 10 MG/ML IJ SOLN
80.0000 mg | Freq: Once | INTRAMUSCULAR | Status: AC
  Administered 2015-12-16: 80 mg via INTRAVENOUS
  Filled 2015-12-16: qty 8

## 2015-12-16 MED ORDER — METHYLPREDNISOLONE SODIUM SUCC 40 MG IJ SOLR
40.0000 mg | Freq: Two times a day (BID) | INTRAMUSCULAR | Status: DC
  Administered 2015-12-16 – 2015-12-17 (×2): 40 mg via INTRAVENOUS
  Filled 2015-12-16 (×2): qty 1

## 2015-12-16 MED ORDER — IPRATROPIUM-ALBUTEROL 0.5-2.5 (3) MG/3ML IN SOLN
3.0000 mL | Freq: Four times a day (QID) | RESPIRATORY_TRACT | Status: DC
  Administered 2015-12-16 – 2015-12-18 (×8): 3 mL via RESPIRATORY_TRACT
  Filled 2015-12-16 (×8): qty 3

## 2015-12-16 MED ORDER — LEVOFLOXACIN IN D5W 250 MG/50ML IV SOLN
250.0000 mg | INTRAVENOUS | Status: DC
  Administered 2015-12-17: 250 mg via INTRAVENOUS
  Filled 2015-12-16: qty 50

## 2015-12-16 NOTE — Consult Note (Signed)
PULMONARY / CRITICAL CARE MEDICINE   Name: Adam Ibarra MRN: [id number] DOB: 07/08/1950    ADMISSION DATE:  12/14/2015 CONSULTATION DATE:  12/15/15  REQUESTING MD: Hugelmeyer  CHIEF COMPLAINT: Acute on chronic hypoxic respiratory failure  PT PROFILE:   [age] M followed by Dr Welton FlakesKhan for several years for pulmonary fibrosis, presumed to be IPF (never biopsied). He is on chronic oxygen therapy @ 4-5 lpm by [place]. He was admitted 10/27 with one month of intermittent scant blood tinged sputum and with 24-48 hours of increased DOE without fever or CP. His sputum is otherwise described as clear. He has been treated with systemic steroids and NIPPV and feels markedly better but not back to his baseline. He is still requiring higher flow of O2 than his baseline  MAJOR EVENTS/TEST RESULTS: 10/27 CTA chest: No large central pulmonary embolus. COPD, bronchiectasis and interstitial fibrosis with superimposed ground-glass pulmonary opacities in a mosaic pattern suggesting small airway disease with admixed air trapping.  INDWELLING DEVICES::   MICRO DATA: MRSA PCR 10/28 >> NEG Resp 10/28 >>   ANTIMICROBIALS:  Levofloxacin 10/28 >>    SUBJ: No new complaints. Somewhat improved overall. Still requiring HFNC O2  VITAL SIGNS: BP (!) 152/72   Pulse 90   Temp 97.8 F (36.6 C) (Oral)   Resp (!) 25   Ht 6' (1.829 m)   Wt 256 lb 13.4 oz (116.5 kg)   SpO2 94%   BMI 34.83 kg/m   HEMODYNAMICS:    VENTILATOR SETTINGS: FiO2 (%):  [50 %-60 %] 60 %  INTAKE / OUTPUT: I/O last 3 completed shifts: In: 1373.7 [P.O.:872; I.V.:401.7; IV Piggyback:100] Out: 1580 [Urine:1580]  PHYSICAL EXAMINATION: General: RASS 0, oriented, no overt distress Neuro: CNs intact, motor/sensory intact, DTRs symmetric HEENT: NCAT, no sclericterus Cardiovascular: reg, no M Lungs: diffuse crackles, no wheezes Abdomen: soft, NT, +BS Ext: symmetric pretibial edem  LABS:  BMET  Recent Labs Lab 12/15/15 0131  12/15/15 0659 12/16/15 0428  NA 134* 136 139  K 4.9 5.1 5.3*  CL 102 104 102  CO2 27 27 29   BUN 31* 32* 57*  CREATININE 1.70* 1.65* 2.15*  GLUCOSE 150* 132* 193*    Electrolytes  Recent Labs Lab 12/15/15 0131 12/15/15 0659 12/16/15 0428  CALCIUM 8.2* 8.4* 8.3*  MG 1.7  --   --   PHOS 3.8  --   --     CBC  Recent Labs Lab 12/15/15 0131 12/15/15 0659 12/16/15 0428  WBC 11.3* 13.3* 21.0*  HGB 10.7* 11.2* 11.2*  HCT 32.5* 33.6* 33.9*  PLT 205 212 264    Coag's  Recent Labs Lab 12/14/15 1353 12/15/15 0131 12/16/15 0428  INR 3.69 5.43* 4.88*    Sepsis Markers  Recent Labs Lab 12/16/15 0428  PROCALCITON 0.21    ABG No results for input(s): PHART, PCO2ART, PO2ART in the last 168 hours.  Liver Enzymes  Recent Labs Lab 12/15/15 0659  AST 19  ALT 14*  ALKPHOS 53  BILITOT 0.8  ALBUMIN 2.8*    Cardiac Enzymes  Recent Labs Lab 12/15/15 0131 12/15/15 0659 12/15/15 1155  TROPONINI 0.03* 0.02 <0.03    Glucose  Recent Labs Lab 12/15/15 0722 12/15/15 1140 12/15/15 1651 12/15/15 2158 12/16/15 0716 12/16/15 1127  GLUCAP 135* 220* 212* 183* 194* 245*    CXR: NSC   ASSESSMENT: 1) Chronic hypoxemic respiratory failure due to severe baseline pulmonary fibrosis 2) Acute on chronic hypoxic respiratory failure - the exact etiology is unclear but in this  setting, the differential diagnosis is usually one of the following.   A) superimposed edema,   B) superimposed infection,   C) airways disease, or  D) acute exacerbation of IPF 3) AKI/CKD - Cr worse after Lasix  PLAN/REC: 1) continue supplemental O2 and PRN BiPAP to maintain SpO2 88-93% 2) Cont empiric levofloxacin 3) Continue systemic steroids - dose adjusted 4) follow CXR 5) Ultimately, he might be a candidate for Pirfenidone (Esbriet) or Nintedaneb (Ofev).   Cont as SDU status for today. When he goes out of ICU/SDU, Dr Welton FlakesKhan should be notified  Billy Fischeravid Melaney Tellefsen, MD PCCM  service Mobile [phone] Pager [phone]  12/16/2015, 2:38 PM

## 2015-12-16 NOTE — Progress Notes (Signed)
ANTICOAGULATION CONSULT NOTE -follow up Consult  Pharmacy Consult for warfarin Indication: AF/ mechanical AVR 2013  Allergies  Allergen Reactions  . Ivp Dye [Iodinated Diagnostic Agents] Itching    Can tolerate if takes benadryl    Patient Measurements: Height: 6' (182.9 cm) Weight: 256 lb 13.4 oz (116.5 kg) IBW/kg (Calculated) : 77.6 Heparin Dosing Weight:   Vital Signs: Temp: 98.2 F (36.8 C) (10/29 0723) Temp Source: Axillary (10/29 0723) BP: 133/70 (10/29 0700) Pulse Rate: 82 (10/29 0700)  Labs:  Recent Labs  12/14/15 1353 12/15/15 0131 12/15/15 0659 12/15/15 1155 12/16/15 0428  HGB 12.2* 10.7* 11.2*  --  11.2*  HCT 36.7* 32.5* 33.6*  --  33.9*  PLT 232 205 212  --  264  LABPROT 37.5* 51.1*  --   --  46.9*  INR 3.69 5.43*  --   --  4.88*  CREATININE 1.67* 1.70* 1.65*  --  2.15*  TROPONINI 0.07* 0.03* 0.02 <0.03  --     Estimated Creatinine Clearance: 44.6 mL/min (by C-G formula based on SCr of 2.15 mg/dL (H)).   Medical History: Past Medical History:  Diagnosis Date  . Aortic stenosis    a. 01/2012 s/p AVR with SJM 23mm mechanical valve.  . Arthritis   . Chronic venous insufficiency 05/24/2012  . Coronary artery disease    a. 01/2012 s/p CABG x 3 (LIMA->LAD, VG->OM1,VG->PDA)  . Hyperlipidemia   . Hypertension   . Nephrolithiasis   . Obesity   . Sleep apnea    a. on CPAP  . Syncope    a. 01/2012 - presumed to be orthostatic in nature.  . Type II diabetes mellitus (HCC)     Medications:   Assessment: 65 yom cc SOB with PMH aortic stenosis sp AVR in 2013 on VKA. Recent VKA dose adjustments per PCP for subtherapeutic INR.  Per Med Rec: pt on Warfarin 5 mg Mon,Tues, Thur, Fri, Sat and Warfarin 2.5 mg Sun, Wed.   10/27 INR 3.69  Noted patient took home dose 10/27 at 1100 10/28 INR 5.43  Dose Held 10/29 INR 4.88  Dose Held   Goal of Therapy:  INR 2-3 (for mechanical aortic valve per ACCP) Monitor platelets by anticoagulation protocol: Yes    Plan:  INR 4.88. Will hold Warfarin dose again today 10/29. Patient on Levaquin. Will recheck INR with AM labs.   Marlenne Ridge A, Pharm.D., BCPS Clinical pharmacist 12/16/2015,7:25 AM

## 2015-12-16 NOTE — Progress Notes (Signed)
Renal dosing adjustment of Antibiotic:  65 yo M on Levaquin 500mg  IV q24h for CAP.  Scr 2.15  crcl 44.6 ml/min.  Will adjust Levaquin to 250mg  IV q24 for Crcl < 50 ml/min per protocol.    Bari MantisKristin Briya Lookabaugh PharmD Clinical Pharmacist 12/16/2015

## 2015-12-17 ENCOUNTER — Inpatient Hospital Stay: Payer: 59

## 2015-12-17 DIAGNOSIS — R7989 Other specified abnormal findings of blood chemistry: Secondary | ICD-10-CM

## 2015-12-17 DIAGNOSIS — R0603 Acute respiratory distress: Secondary | ICD-10-CM

## 2015-12-17 DIAGNOSIS — R748 Abnormal levels of other serum enzymes: Secondary | ICD-10-CM

## 2015-12-17 DIAGNOSIS — R778 Other specified abnormalities of plasma proteins: Secondary | ICD-10-CM

## 2015-12-17 DIAGNOSIS — I25810 Atherosclerosis of coronary artery bypass graft(s) without angina pectoris: Secondary | ICD-10-CM

## 2015-12-17 LAB — GLUCOSE, CAPILLARY
GLUCOSE-CAPILLARY: 192 mg/dL — AB (ref 65–99)
GLUCOSE-CAPILLARY: 191 mg/dL — AB (ref 65–99)
Glucose-Capillary: 187 mg/dL — ABNORMAL HIGH (ref 65–99)
Glucose-Capillary: 284 mg/dL — ABNORMAL HIGH (ref 65–99)

## 2015-12-17 LAB — CBC
HEMATOCRIT: 34 % — AB (ref 40.0–52.0)
Hemoglobin: 11.1 g/dL — ABNORMAL LOW (ref 13.0–18.0)
MCH: 28.4 pg (ref 26.0–34.0)
MCHC: 32.6 g/dL (ref 32.0–36.0)
MCV: 87 fL (ref 80.0–100.0)
Platelets: 273 10*3/uL (ref 150–440)
RBC: 3.91 MIL/uL — ABNORMAL LOW (ref 4.40–5.90)
RDW: 16 % — AB (ref 11.5–14.5)
WBC: 19.2 10*3/uL — ABNORMAL HIGH (ref 3.8–10.6)

## 2015-12-17 LAB — BASIC METABOLIC PANEL
ANION GAP: 6 (ref 5–15)
BUN: 71 mg/dL — AB (ref 6–20)
CO2: 29 mmol/L (ref 22–32)
Calcium: 8.3 mg/dL — ABNORMAL LOW (ref 8.9–10.3)
Chloride: 102 mmol/L (ref 101–111)
Creatinine, Ser: 2.03 mg/dL — ABNORMAL HIGH (ref 0.61–1.24)
GFR calc non Af Amer: 32 mL/min — ABNORMAL LOW (ref >60)
GFR, EST AFRICAN AMERICAN: 38 mL/min — AB (ref >60)
GLUCOSE: 197 mg/dL — AB (ref 65–99)
Potassium: 4.7 mmol/L (ref 3.5–5.1)
SODIUM: 137 mmol/L (ref 135–145)

## 2015-12-17 LAB — PROTIME-INR
INR: 3.69
PROTHROMBIN TIME: 37.5 s — AB (ref 11.4–15.2)

## 2015-12-17 MED ORDER — PREDNISONE 20 MG PO TABS
40.0000 mg | ORAL_TABLET | Freq: Every day | ORAL | Status: DC
  Administered 2015-12-18: 40 mg via ORAL
  Filled 2015-12-17: qty 2

## 2015-12-17 MED ORDER — LEVOFLOXACIN 500 MG PO TABS
250.0000 mg | ORAL_TABLET | Freq: Every day | ORAL | Status: DC
  Administered 2015-12-18: 250 mg via ORAL
  Filled 2015-12-17: qty 1

## 2015-12-17 NOTE — Progress Notes (Signed)
Pt arrived via bed from CCU at approx 1330, pt alert and oriented, O2 on at [facility] which pt states is his baseline, pt in no distress. Few crackles heard to RLL, S1S2 with mechanical click heard from AVR. Abdomen is soft, bs heard. Pt voiding in urinal prn. Pt denied pain. Pt oriented to room and call bell.

## 2015-12-17 NOTE — Progress Notes (Signed)
Patient Name: Adam Ibarra Date of Encounter: 12/17/2015  Primary Cardiologist: Dr. Emi Holes Problem List     Active Problems:   Acute on chronic respiratory failure with hypoxia [facility])   Acute respiratory distress   Coronary artery disease involving coronary bypass graft of native heart without angina pectoris     Subjective   He reports improvement in shortness of breath but still not at baseline. He continues to be on high flow nasal cannula.  Inpatient Medications    Scheduled Meds: . amitriptyline  75-100 mg Oral QHS  . amLODipine  2.5 mg Oral Daily  . aspirin EC  81 mg Oral Daily  . carvedilol  25 mg Oral BID WC  . chlorhexidine  15 mL Mouth Rinse BID  . gabapentin  100 mg Oral QHS  . hydrALAZINE  10 mg Oral BID  . insulin aspart  0-20 Units Subcutaneous TID WC  . insulin aspart  0-5 Units Subcutaneous QHS  . ipratropium-albuterol  3 mL Nebulization Q6H  . levofloxacin (LEVAQUIN) IV  250 mg Intravenous Q24H  . mouth rinse  15 mL Mouth Rinse q12n4p  . methylPREDNISolone (SOLU-MEDROL) injection  40 mg Intravenous Q12H  . sodium chloride flush  3 mL Intravenous Q12H  . Warfarin - Pharmacist Dosing Inpatient   Does not apply q1800   Continuous Infusions:   PRN Meds: acetaminophen **OR** [DISCONTINUED] acetaminophen, bisacodyl, HYDROcodone-acetaminophen, magnesium citrate, ondansetron **OR** ondansetron (ZOFRAN) IV, senna-docusate, zolpidem   Vital Signs    Vitals:   12/17/15 0700 12/17/15 0743 12/17/15 0800 12/17/15 0931  BP: (!) 145/80  (!) 143/74 139/70  Pulse: 77  83   Resp: 20  18   Temp:   97 F (36.1 C)   TempSrc:   Axillary   SpO2: 95% 91% 93%   Weight:      Height:        Intake/Output Summary (Last 24 hours) at 12/17/15 0936 Last data filed at 12/17/15 0934  Gross per 24 hour  Intake              303 ml  Output             1500 ml  Net            -1197 ml   Filed Weights   12/14/15 1336 12/15/15 0000  Weight: 258 lb (117 kg) 256  lb 13.4 oz (116.5 kg)    Physical Exam    GEN: Well nourished, well developed, in no acute distress.  HEENT: Grossly normal.  Neck: Supple, no JVD, carotid bruits, or masses. Cardiac: RRR With normal mechanical heart sounds, no murmurs, rubs, or gallops. No clubbing, cyanosis, edema.   Respiratory:  Respirations regular and unlabored,  bilateral fine crackles GI: Soft, nontender, nondistended, BS + x 4. MS: no deformity or atrophy. Skin: warm and dry, no rash. Neuro:  Strength and sensation are intact. Psych: AAOx3.  Normal affect.  Labs    CBC  Recent Labs  12/14/15 1353  12/16/15 0428 12/17/15 0308  WBC 13.1*  < > 21.0* 19.2*  NEUTROABS 10.7*  --   --   --   HGB 12.2*  < > 11.2* 11.1*  HCT 36.7*  < > 33.9* 34.0*  MCV 86.1  < > 86.1 87.0  PLT 232  < > 264 273  < > = values in this interval not displayed. Basic Metabolic Panel  Recent Labs  12/15/15 0131  12/16/15 0428 12/17/15 0308  NA  134*  < > 139 137  K 4.9  < > 5.3* 4.7  CL 102  < > 102 102  CO2 27  < > 29 29  GLUCOSE 150*  < > 193* 197*  BUN 31*  < > 57* 71*  CREATININE 1.70*  < > 2.15* 2.03*  CALCIUM 8.2*  < > 8.3* 8.3*  MG 1.7  --   --   --   PHOS 3.8  --   --   --   < > = values in this interval not displayed. Liver Function Tests  Recent Labs  12/15/15 0659  AST 19  ALT 14*  ALKPHOS 53  BILITOT 0.8  PROT 7.1  ALBUMIN 2.8*   No results for input(s): LIPASE, AMYLASE in the last 72 hours. Cardiac Enzymes  Recent Labs  12/15/15 0131 12/15/15 0659 12/15/15 1155  TROPONINI 0.03* 0.02 <0.03   BNP Invalid input(s): POCBNP D-Dimer No results for input(s): DDIMER in the last 72 hours. Hemoglobin A1C  Recent Labs  12/15/15 0131  HGBA1C 6.1*   Fasting Lipid Panel  Recent Labs  12/15/15 0659  CHOL 165  HDL 45  LDLCALC 104*  TRIG 82  CHOLHDL 3.7   Thyroid Function Tests No results for input(s): TSH, T4TOTAL, T3FREE, THYROIDAB in the last 72 hours.  Invalid input(s):  FREET3  Telemetry    Normal sinus rhythm - Personally Reviewed  ECG    Not done   Radiology    Dg Chest Port 1 View  Result Date: 12/17/2015 CLINICAL DATA:  Respiratory failure, history of coronary artery disease, aortic stenosis, previous CABG and aortic valve replacement. EXAM: PORTABLE CHEST 1 VIEW COMPARISON:  Portable chest x-ray of December 16, 2015 FINDINGS: The patient is positioned in a somewhat more chronic manner. The lungs remain mildly hypoinflated. The interstitial markings remain increased bilaterally greatest in the left lower lung. The cardiac silhouette is enlarged but the margins are indistinct. There are post CABG changes. There is no pleural effusion or pneumothorax. IMPRESSION: Stable appearance of the chest since yesterday's study. Persistent increased interstitial densities bilaterally may reflect interstitial edema or pneumonia superimposed on underlying chronic lung disease. Electronically Signed   By: David  Swazil[place]Jordan M.D.   On: 12/17/2015 07:27   Dg Chest Port 1 View  Result Date: 12/16/2015 CLINICAL DATA:  Respiratory failure EXAM: PORTABLE CHEST 1 VIEW COMPARISON:  12/14/2015 chest radiograph. FINDINGS: Sternotomy wires appear aligned and intact. Aortic valve prosthesis is in place. Stable cardiomediastinal silhouette with mild cardiomegaly and aortic atherosclerosis. No pneumothorax. No pleural effusion. Diffuse hazy and linear parahilar opacities appear minimally improved. Underlying reticular interstitial opacities, basilar predominant. IMPRESSION: 1. Stable mild cardiomegaly. Slight improvement in diffuse hazy and linear parahilar lung opacities, with stable underlying basilar predominant reticular interstitial opacities. Favor slightly improved moderate pulmonary edema superimposed on chronic interstitial lung disease. 2. Aortic atherosclerosis. Electronically Signed   By: Delbert PhenixJason A Poff M.D.   On: 12/16/2015 08:23    Cardiac Studies   None  Patient Profile       65 year old male with known history of coronary artery disease/aortic stenosis status post CABG and aortic valve replacement with a mechanical heart valve. He presented with respiratory failure due to his known idiopathic pulmonary fibrosis.  Assessment & Plan    1. Acute on chronic respiratory failure: Due to exacerbation of his underlying lung disease. I reviewed his chest x-ray which continues to show bilateral pulmonary infiltrates Likely reflecting underlying lung disease. A component of heart failure therapy  completely excluded.  We should diurese only as needed. The patient might require referral to pulmonary fibrosis clinic and consideration of lung transplant evaluation if his condition continues to deteriorate.  2. Coronary artery disease involving native coronary arteries without angina: No anginal symptoms at the present time.  3. Status post aortic valve replacement with a mechanical valve: This was functioning normally on most recent echocardiogram few months ago. Currently on anticoagulation with warfarin with a target INR between 2 and 3. INR is currently elevated likely due to being on antibiotics.   Signed, Lorine BearsMuhammad Merita Hawks, MD  12/17/2015, 9:36 AM

## 2015-12-17 NOTE — Progress Notes (Signed)
Pt has remained alert and oriented x4 with no c/o pain. Lung sounds are clear, diminished. RR even and unlabored. Pt weaned to [facility]5LNC which is baseline- from Bipap early this morning. Pt does desat rather quickly with exertion and talking, but that is normal per pt. NSR on cardiac monitor.  Pt with orders to transfer to med-surg.

## 2015-12-17 NOTE — Progress Notes (Signed)
Inpatient Diabetes Program Recommendations  AACE/ADA: New Consensus Statement on Inpatient Glycemic Control (2015)  Target Ranges:  Prepandial:   less than 140 mg/dL      Peak postprandial:   less than 180 mg/dL (1-2 hours)      Critically ill patients:  140 - 180 mg/dL   Lab Results  Component Value Date   GLUCAP 284 (H) 12/17/2015   HGBA1C 6.1 (H) 12/15/2015    Review of Glycemic Control:   Results for Adam Ibarra, Adam Ibarra (MRN [id number]) as of 12/17/2015 13:28  Ref. Range 12/16/2015 11:27 12/16/2015 17:07 12/16/2015 21:30 12/17/2015 07:34 12/17/2015 11:16  Glucose-Capillary Latest Ref Range: 65 - 99 mg/dL 409245 (H) 811186 (H) 914155 (H) 187 (H) 284 (H)   Diabetes history: Type 2 diabetes Outpatient Diabetes medications: Amaryl 4 mg bid, Truliciity 1.5 mg weekly Current orders for Inpatient glycemic control:  Novolog resistant tid with meals and HS  Inpatient Diabetes Program Recommendations:    May consider adding Novolog meal coverage 3 units tid with meals (hold if patient eats less than 50%) while patient is on Prednisone.   Thanks, Beryl MeagerJenny Rathana Viveros, RN, BC-ADM Inpatient Diabetes Coordinator Pager [phone] (8a-5p)

## 2015-12-17 NOTE — Progress Notes (Signed)
ANTICOAGULATION CONSULT NOTE -follow up Consult  Pharmacy Consult for warfarin Indication: AF/ mechanical AVR 2013  Allergies  Allergen Reactions  . Ivp Dye [Iodinated Diagnostic Agents] Itching    Can tolerate if takes benadryl    Patient Measurements: Height: 6' (182.9 cm) Weight: 256 lb 13.4 oz (116.5 kg) IBW/kg (Calculated) : 77.6 Heparin Dosing Weight:   Vital Signs: Temp: 97 F (36.1 C) (10/30 0800) Temp Source: Axillary (10/30 0800) BP: 139/70 (10/30 0931) Pulse Rate: 83 (10/30 0800)  Labs:  Recent Labs  12/15/15 0131 12/15/15 0659 12/15/15 1155 12/16/15 0428 12/17/15 0308  HGB 10.7* 11.2*  --  11.2* 11.1*  HCT 32.5* 33.6*  --  33.9* 34.0*  PLT 205 212  --  264 273  LABPROT 51.1*  --   --  46.9* 37.5*  INR 5.43*  --   --  4.88* 3.69  CREATININE 1.70* 1.65*  --  2.15* 2.03*  TROPONINI 0.03* 0.02 <0.03  --   --     Estimated Creatinine Clearance: 47.2 mL/min (by C-G formula based on SCr of 2.03 mg/dL (H)).   Medical History: Past Medical History:  Diagnosis Date  . Aortic stenosis    a. 01/2012 s/p AVR with SJM 23mm mechanical valve.  . Arthritis   . Chronic venous insufficiency 05/24/2012  . Coronary artery disease    a. 01/2012 s/p CABG x 3 (LIMA->LAD, VG->OM1,VG->PDA)  . Hyperlipidemia   . Hypertension   . Nephrolithiasis   . Obesity   . Sleep apnea    a. on CPAP  . Syncope    a. 01/2012 - presumed to be orthostatic in nature.  . Type II diabetes mellitus (HCC)     Medications:   Assessment: 65 yom cc SOB with PMH aortic stenosis sp AVR in 2013 on VKA. Recent VKA dose adjustments per PCP for subtherapeutic INR.  Per Med Rec: pt on Warfarin 5 mg Mon,Tues, Thur, Fri, Sat and Warfarin 2.5 mg Sun, Wed.   10/27 INR 3.69  Noted patient took home dose 10/27 at 1100 10/28 INR 5.43  Dose Held 10/29 INR 4.88  Dose Held 10/30   INR 3.69   Goal of Therapy:  INR 2-3 (for mechanical aortic valve per ACCP) Monitor platelets by anticoagulation  protocol: Yes   Plan:  Will continue to hold warfarin and f/u AM INR. Patient remains on Levaquin.   Valentina Guhristy, Hasset Chaviano D, PharmD Clinical pharmacist 12/17/2015,11:49 AM

## 2015-12-17 NOTE — Plan of Care (Signed)
Problem: Health Behavior/Discharge Planning: Goal: Ability to manage health-related needs will improve Outcome: Progressing Pt has been transferred to 1A from CCU, states he is feeling better. Pt remains on baseline level of O2, desats when he eats, talks, or exerts himself. Physical therapy to evaluate.

## 2015-12-17 NOTE — Care Management (Signed)
Patient was seen by this RNCM this past summer. He had home O2 arranged with [facility] at that time whereas he has privately purchased an O2 concentrator. At that time his PCP was Dr. Beverely RisenFozia Khan. He did not require any DME for ambulation also at that time. RNCM will continue to follow.

## 2015-12-17 NOTE — Progress Notes (Signed)
PT Evaluation Note  Assessment: Pt admitted with below diagnosis. Pt currently with functional limitations due to the deficits listed below (see PT Problem List). Adam Ibarra presents with acute on chronic respiratory failure and SpO2 drops from 91% to 76% on 5L O2 with bed mobility.  Therefore, evaluation was limited.  Pt denies any falls over the past 6 months and is independent working full time at baseline.  Given his main limitation is his respiratory status, recommending Pulmonary rehab.  Pt will benefit from skilled PT to increase their independence and safety with mobility with focus on energy conservation to allow discharge to the venue listed below.      12/17/15 1537  PT Visit Information  Last PT Received On 12/17/15  Assistance Needed +1  History of Present Illness Pt is a 65 y/o M admitted with acute on chronic respiratory failure who was placed on BiPAP and now on 5L O2 which is his baseline.  Pt presented with reports of scant blood tinged sputum and increasing DOE and 2 syncopal episode.  Pt's PMH includes pulmonary fibrosis, aortic stenosis s/p valve replacement with mechanical valve, diabetic neuropathy, venous insufficiency, obesity, syncope.  Precautions  Precautions Other (comment)  Precaution Comments SpO2 drops in the 70's on 5L O2 at baseline  Restrictions  Weight Bearing Restrictions No  Home Living  Family/patient expects to be discharged to: Private residence  Living Arrangements Spouse/significant other  Available Help at Discharge Family;Available 24 hours/day  Type of Home House  Home Access Stairs to enter  Entrance Stairs-Number of Steps 3  Entrance Stairs-Rails Left;Right;Can reach both  Home Layout One level  Engineer, technical salesBathroom Shower/Tub Walk-in shower  Bathroom Toilet Standard  Home Equipment Shower seat - built in  Prior Function  Level of Independence Independent  Comments No AD at baseline.  5L O2 at baseline.  Denies any falls over the past 6 months.   Communication  Communication No difficulties  Pain Assessment  Pain Assessment No/denies pain  Cognition  Arousal/Alertness Awake/alert  Behavior During Therapy WFL for tasks assessed/performed  Overall Cognitive Status Within Functional Limits for tasks assessed  Upper Extremity Assessment  Upper Extremity Assessment Overall WFL for tasks assessed  Lower Extremity Assessment  Lower Extremity Assessment RLE deficits/detail;LLE deficits/detail  RLE Deficits / Details knee extension 4/5  LLE Deficits / Details knee extension 4/5  Bed Mobility  Overal bed mobility Modified Independent  General bed mobility comments Increased effort and WOB.  SpO2 drops from 91% to 76% on 5L O2 with supine>sit.  Transfers  Overall transfer level Modified independent  Equipment used None  General transfer comment No cues or assist needed but pt with inceased WOB and DOE  Ambulation/Gait  Gait velocity unable to assess due to hypoxia  Balance  Overall balance assessment No apparent balance deficits (not formally assessed)  Exercises  Exercises Other exercises;General Lower Extremity  General Exercises - Lower Extremity  Ankle Circles/Pumps AROM;Both;10 reps;Seated  Long Arc Quad AROM;Both;10 reps;Seated  Hip Flexion/Marching AROM;Both;10 reps;Seated  Other Exercises  Other Exercises Demonstrated and pt performed pursed lip breathing while seated and with mobility.  Pt unable to take significant inhale  PT - End of Session  Equipment Utilized During Treatment Oxygen  Activity Tolerance Treatment limited secondary to medical complications (Comment) (hypoxia)  Patient left in chair;with call bell/phone within reach  Nurse Communication Mobility status;Other (comment) (SpO2)  PT Assessment  PT Recommendation/Assessment Patient needs continued PT services  PT Problem List Decreased strength;Decreased activity tolerance;Decreased knowledge of precautions;Cardiopulmonary  status limiting activity  PT  Plan  PT Frequency (ACUTE ONLY) Min 2X/week  PT Treatment/Interventions (ACUTE ONLY) Therapeutic exercise;Neuromuscular re-education;Other (comment) (energy conservation techniques)  PT Recommendation  Recommendations for Other Services Other (comment) (Pulmonary Rehab)  Follow Up Recommendations No PT follow up  PT equipment None recommended by PT  Individuals Consulted  Consulted and Agree with Results and Recommendations Patient  Acute Rehab PT Goals  Patient Stated Goal to go home  PT Goal Formulation With patient  Time For Goal Achievement 12/24/15  Potential to Achieve Goals Fair  PT Time Calculation  PT Start Time (ACUTE ONLY) 1528  PT Stop Time (ACUTE ONLY) 1546  PT Time Calculation (min) (ACUTE ONLY) 18 min  PT General Charges  $$ ACUTE PT VISIT 1 Procedure  PT Evaluation  $PT Eval Low Complexity 1 Procedure  PT Treatments  $Therapeutic Activity 8-22 mins  Written Expression  Dominant Hand Right    Encarnacion ChuAshley Abashian PT, DPT

## 2015-12-17 NOTE — Progress Notes (Signed)
[facility] - [place] [facility]   PATIENT NAME: Adam Ibarra    MR#:  [id number]  DATE OF BIRTH:  10/31/1950  SUBJECTIVE:  Came in with increasing sob. On 66% HFNC  REVIEW OF SYSTEMS:   Review of Systems  Constitutional: Negative for chills, fever and weight loss.  HENT: Negative for ear discharge, ear pain and nosebleeds.   Eyes: Negative for blurred vision, pain and discharge.  Respiratory: Positive for shortness of breath. Negative for sputum production, wheezing and stridor.   Cardiovascular: Negative for chest pain, palpitations, orthopnea and PND.  Gastrointestinal: Negative for abdominal pain, diarrhea, nausea and vomiting.  Genitourinary: Negative for frequency and urgency.  Musculoskeletal: Negative for back pain and joint pain.  Neurological: Positive for weakness. Negative for sensory change, speech change and focal weakness.  Psychiatric/Behavioral: Negative for depression and hallucinations. The patient is not nervous/anxious.    Tolerating Diet:yes Tolerating PT: pending  DRUG ALLERGIES:   Allergies  Allergen Reactions  . Ivp Dye [Iodinated Diagnostic Agents] Itching    Can tolerate if takes benadryl    VITALS:  Blood pressure (!) 159/65, pulse 99, temperature 98.2 F (36.8 C), temperature source Oral, resp. rate 19, height 6' (1.829 m), weight 116.5 kg (256 lb 13.4 oz), SpO2 90 %.  PHYSICAL EXAMINATION:   Physical Exam  GENERAL:  65 y.o.-year-old patient lying in the bed with no acute distress.  EYES: Pupils equal, round, reactive to light and accommodation. No scleral icterus. Extraocular muscles intact.  HEENT: Head atraumatic, normocephalic. Oropharynx and nasopharynx clear. HFNC NECK:  Supple, no jugular venous distention. No thyroid enlargement, no tenderness.  LUNGS: distant breath sounds bilaterally, no wheezing, rales, rhonchi. No use of accessory muscles of respiration.  CARDIOVASCULAR: S1, S2 normal. No murmurs, rubs,  or gallops.  ABDOMEN: Soft, nontender, nondistended. Bowel sounds present. No organomegaly or mass.  EXTREMITIES: No cyanosis, clubbing or edema b/l.    NEUROLOGIC: Cranial nerves II through XII are intact. No focal Motor or sensory deficits b/l.   PSYCHIATRIC:  patient is alert and oriented x 3.  SKIN: No obvious rash, lesion, or ulcer.   LABORATORY PANEL:  CBC  Recent Labs Lab 12/17/15 0308  WBC 19.2*  HGB 11.1*  HCT 34.0*  PLT 273    Chemistries   Recent Labs Lab 12/15/15 0131 12/15/15 0659  12/17/15 0308  NA 134* 136  < > 137  K 4.9 5.1  < > 4.7  CL 102 104  < > 102  CO2 27 27  < > 29  GLUCOSE 150* 132*  < > 197*  BUN 31* 32*  < > 71*  CREATININE 1.70* 1.65*  < > 2.03*  CALCIUM 8.2* 8.4*  < > 8.3*  MG 1.7  --   --   --   AST  --  19  --   --   ALT  --  14*  --   --   ALKPHOS  --  53  --   --   BILITOT  --  0.8  --   --   < > = values in this interval not displayed. Cardiac Enzymes  Recent Labs Lab 12/15/15 1155  TROPONINI <0.03   RADIOLOGY:  Dg Chest Port 1 View  Result Date: 12/17/2015 CLINICAL DATA:  Respiratory failure, history of coronary artery disease, aortic stenosis, previous CABG and aortic valve replacement. EXAM: PORTABLE CHEST 1 VIEW COMPARISON:  Portable chest x-ray of December 16, 2015 FINDINGS: The patient is  positioned in a somewhat more chronic manner. The lungs remain mildly hypoinflated. The interstitial markings remain increased bilaterally greatest in the left lower lung. The cardiac silhouette is enlarged but the margins are indistinct. There are post CABG changes. There is no pleural effusion or pneumothorax. IMPRESSION: Stable appearance of the chest since yesterday's study. Persistent increased interstitial densities bilaterally may reflect interstitial edema or pneumonia superimposed on underlying chronic lung disease. Electronically Signed   By: David  SwazilandJordan M.D.   On: 12/17/2015 07:27   Dg Chest Port 1 View  Result Date:  12/16/2015 CLINICAL DATA:  Respiratory failure EXAM: PORTABLE CHEST 1 VIEW COMPARISON:  12/14/2015 chest radiograph. FINDINGS: Sternotomy wires appear aligned and intact. Aortic valve prosthesis is in place. Stable cardiomediastinal silhouette with mild cardiomegaly and aortic atherosclerosis. No pneumothorax. No pleural effusion. Diffuse hazy and linear parahilar opacities appear minimally improved. Underlying reticular interstitial opacities, basilar predominant. IMPRESSION: 1. Stable mild cardiomegaly. Slight improvement in diffuse hazy and linear parahilar lung opacities, with stable underlying basilar predominant reticular interstitial opacities. Favor slightly improved moderate pulmonary edema superimposed on chronic interstitial lung disease. 2. Aortic atherosclerosis. Electronically Signed   By: Delbert PhenixJason A Poff M.D.   On: 12/16/2015 08:23   ASSESSMENT AND PLAN:   65 y.o. male with a history of Pulmonary fibrosis, coronary artery disease, diabetes, history of aortic stenosis status post valve replacement with mechanical valve on Coumadin, sleep apnea now being admitted with: 1. Acute on chronic respiratory failure with hypoxia due to severe IPF - currently off BiPAP -IV steroids, nebulizers. Pulmonary consultation has been requested for comanagement.  2. Acute kidney injury-gentle IV fluid hydration . 3. Elevated troponin with nonischemic - Patient already takes aspirin, Coreg. 4. History of diabetes-Accu-Cheks every 4 hours as patient will be nothing by mouth. 5. History of hypertension-continue Norvasc, Coreg, hydralazine 6. History of aortic valve replacement with therapeutic INR-continue Coumadin per pharmacy 7. History of diabetic neuropathy-continue Neurontin Case discussed with Care Management/Social Worker. Management plans discussed with the patient, family and they are in agreement.  CODE STATUS: full  DVT Prophylaxis: lovenox TOTAL TIME TAKING CARE OF THIS PATIENT: 30 minutes.   >50% time spent on counselling and coordination of care  POSSIBLE D/C IN 2-3 DAYS, DEPENDING ON CLINICAL CONDITION.  Note: This dictation was prepared with Dragon dictation along with smaller phrase technology. Any transcriptional errors that result from this process are unintentional.  Conall Vangorder M.D   Between 7am to 6pm - Pager - [phone]  After 6pm go to [email or web address] - password [email or web address] [email or web address]  [facility]  Office  [phone]  CC: Primary care physician; Julien Nordmannimothy Gollan, MD

## 2015-12-17 NOTE — Consult Note (Signed)
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 He is on chronic oxygen therapy @ 4-5 lpm by [place]. He was admitted 10/27 with one month of intermittent scant blood tinged sputum and with 24-48 hours of increased DOE without fever or CP. His sputum is otherwise described as clear. He has been treated with systemic steroids and NIPPV and feels markedly better but not back to his baseline. He is still requiring higher flow of O2 than his baseline  MAJOR EVENTS/TEST RESULTS: 10/27 CTA chest: No large central pulmonary embolus. COPD, bronchiectasis and interstitial fibrosis with superimposed ground-glass pulmonary opacities in a mosaic pattern suggesting small airway disease with admixed air trapping.   MICRO DATA: MRSA PCR 10/28 >> NEG Resp 10/28 >>   ANTIMICROBIALS:  Levofloxacin 10/28 >>    SUBJ: No new complaints. Somewhat improved overall. On biPAP and is breathing comfortabel Will transition to  today, assess resp status  VITAL SIGNS: BP 129/70   Pulse 78   Temp 98.3 F (36.8 C) (Axillary)   Resp (!) 21   Ht 6' (1.829 m)   Wt 256 lb 13.4 oz (116.5 kg)   SpO2 96%   BMI 34.83 kg/m   HEMODYNAMICS:    VENTILATOR SETTINGS: FiO2 (%):  [50 %-60 %] 50 %  INTAKE / OUTPUT: I/O last 3 completed shifts: In: 540 [P.O.:440; IV Piggyback:100] Out: 1880 [Urine:1880]  PHYSICAL EXAMINATION: General:  oriented, no overt distress Neuro: CNs intact, motor/sensory intact, DTRs symmetric HEENT: NCAT, no sclericterus Cardiovascular: reg, no M Lungs: diffuse crackles, no wheezes Abdomen: soft, NT, +BS Ext: symmetric pretibial edem  LABS:  BMET  Recent Labs Lab  12/15/15 0659 12/16/15 0428 12/17/15 0308  NA 136 139 137  K 5.1 5.3* 4.7  CL 104 102 102  CO2 27 29 29   BUN 32* 57* 71*  CREATININE 1.65* 2.15* 2.03*  GLUCOSE 132* 193* 197*    Electrolytes  Recent Labs Lab 12/15/15 0131 12/15/15 0659 12/16/15 0428 12/17/15 0308  CALCIUM 8.2* 8.4* 8.3* 8.3*  MG 1.7  --   --   --   PHOS 3.8  --   --   --     CBC  Recent Labs Lab 12/15/15 0659 12/16/15 0428 12/17/15 0308  WBC 13.3* 21.0* 19.2*  HGB 11.2* 11.2* 11.1*  HCT 33.6* 33.9* 34.0*  PLT 212 264 273    Coag's  Recent Labs Lab 12/15/15 0131 12/16/15 0428 12/17/15 0308  INR 5.43* 4.88* 3.69    Sepsis Markers  Recent Labs Lab 12/16/15 0428  PROCALCITON 0.21    ABG No results for input(s): PHART, PCO2ART, PO2ART in the last 168 hours.  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   A) superimposed edema,   B) superimposed infection,   C) airways disease, or  D) acute exacerbation of IPF 3) AKI/CKD - Cr worse after Lasix  PLAN/REC: 1) continue supplemental O2 and PRN BiPAP to maintain SpO2 88-92% 2) Cont empiric levofloxacin 3) Continue systemic steroids  4) follow CXR 5) Ultimately, he might be a candidate for Pirfenidone (Esbriet) or Nintedaneb  (Ofev).   Cont as SDU status for today. When he goes out of ICU/SDU, Dr Welton FlakesKhan should be notified  RESP plan:  continue BIPAP at night Wean fio2 as tolerated Assess transfer to gen med floor   The Patient requires high complexity decision making for assessment and support, frequent evaluation and titration of therapies.   Lucie LeatherKurian David Steven Basso, M.D.  [facility]  Medical Director [facility] Medical Director [facility] Cardio-Pulmonary Department

## 2015-12-17 NOTE — Progress Notes (Signed)
[facility] [place] at [facility]   PATIENT NAME: Adam Ibarra    MR#:  [id number]  DATE OF BIRTH:  07/27/1950  SUBJECTIVE:  Came in with increasing sob. Was On 60% HFNC---now on 5 liters [place] Feels a lot better  REVIEW OF SYSTEMS:   Review of Systems  Constitutional: Negative for chills, fever and weight loss.  HENT: Negative for ear discharge, ear pain and nosebleeds.   Eyes: Negative for blurred vision, pain and discharge.  Respiratory: Positive for shortness of breath. Negative for sputum production, wheezing and stridor.   Cardiovascular: Negative for chest pain, palpitations, orthopnea and PND.  Gastrointestinal: Negative for abdominal pain, diarrhea, nausea and vomiting.  Genitourinary: Negative for frequency and urgency.  Musculoskeletal: Negative for back pain and joint pain.  Neurological: Positive for weakness. Negative for sensory change, speech change and focal weakness.  Psychiatric/Behavioral: Negative for depression and hallucinations. The patient is not nervous/anxious.    Tolerating Diet:yes Tolerating PT: ambulatory  DRUG ALLERGIES:   Allergies  Allergen Reactions  . Ivp Dye [Iodinated Diagnostic Agents] Itching    Can tolerate if takes benadryl    VITALS:  Blood pressure (!) 159/65, pulse 99, temperature 98.2 F (36.8 C), temperature source Oral, resp. rate 19, height 6' (1.829 m), weight 116.5 kg (256 lb 13.4 oz), SpO2 90 %.  PHYSICAL EXAMINATION:   Physical Exam  GENERAL:  65 y.o.-year-old patient lying in the bed with no acute distress.  EYES: Pupils equal, round, reactive to light and accommodation. No scleral icterus. Extraocular muscles intact.  HEENT: Head atraumatic, normocephalic. Oropharynx and nasopharynx clear. HFNC NECK:  Supple, no jugular venous distention. No thyroid enlargement, no tenderness.  LUNGS: distant breath sounds bilaterally, no wheezing, rales, rhonchi. No use of accessory muscles of respiration.   CARDIOVASCULAR: S1, S2 normal. No murmurs, rubs, or gallops.  ABDOMEN: Soft, nontender, nondistended. Bowel sounds present. No organomegaly or mass.  EXTREMITIES: No cyanosis, clubbing or edema b/l.    NEUROLOGIC: Cranial nerves II through XII are intact. No focal Motor or sensory deficits b/l.   PSYCHIATRIC:  patient is alert and oriented x 3.  SKIN: No obvious rash, lesion, or ulcer.   LABORATORY PANEL:  CBC  Recent Labs Lab 12/17/15 0308  WBC 19.2*  HGB 11.1*  HCT 34.0*  PLT 273    Chemistries   Recent Labs Lab 12/15/15 0131 12/15/15 0659  12/17/15 0308  NA 134* 136  < > 137  K 4.9 5.1  < > 4.7  CL 102 104  < > 102  CO2 27 27  < > 29  GLUCOSE 150* 132*  < > 197*  BUN 31* 32*  < > 71*  CREATININE 1.70* 1.65*  < > 2.03*  CALCIUM 8.2* 8.4*  < > 8.3*  MG 1.7  --   --   --   AST  --  19  --   --   ALT  --  14*  --   --   ALKPHOS  --  53  --   --   BILITOT  --  0.8  --   --   < > = values in this interval not displayed. Cardiac Enzymes  Recent Labs Lab 12/15/15 1155  TROPONINI <0.03   RADIOLOGY:  Dg Chest Port 1 View  Result Date: 12/17/2015 CLINICAL DATA:  Respiratory failure, history of coronary artery disease, aortic stenosis, previous CABG and aortic valve replacement. EXAM: PORTABLE CHEST 1 VIEW COMPARISON:  Portable chest  x-ray of December 16, 2015 FINDINGS: The patient is positioned in a somewhat more chronic manner. The lungs remain mildly hypoinflated. The interstitial markings remain increased bilaterally greatest in the left lower lung. The cardiac silhouette is enlarged but the margins are indistinct. There are post CABG changes. There is no pleural effusion or pneumothorax. IMPRESSION: Stable appearance of the chest since yesterday's study. Persistent increased interstitial densities bilaterally may reflect interstitial edema or pneumonia superimposed on underlying chronic lung disease. Electronically Signed   By: David  SwazilandJordan M.D.   On: 12/17/2015 07:27    Dg Chest Port 1 View  Result Date: 12/16/2015 CLINICAL DATA:  Respiratory failure EXAM: PORTABLE CHEST 1 VIEW COMPARISON:  12/14/2015 chest radiograph. FINDINGS: Sternotomy wires appear aligned and intact. Aortic valve prosthesis is in place. Stable cardiomediastinal silhouette with mild cardiomegaly and aortic atherosclerosis. No pneumothorax. No pleural effusion. Diffuse hazy and linear parahilar opacities appear minimally improved. Underlying reticular interstitial opacities, basilar predominant. IMPRESSION: 1. Stable mild cardiomegaly. Slight improvement in diffuse hazy and linear parahilar lung opacities, with stable underlying basilar predominant reticular interstitial opacities. Favor slightly improved moderate pulmonary edema superimposed on chronic interstitial lung disease. 2. Aortic atherosclerosis. Electronically Signed   By: Delbert PhenixJason A Poff M.D.   On: 12/16/2015 08:23   ASSESSMENT AND PLAN:   65 y.o. male with a history of Pulmonary fibrosis, coronary artery disease, diabetes, history of aortic stenosis status post valve replacement with mechanical valve on Coumadin, sleep apnea now being admitted with: 1. Acute on chronic respiratory failure with hypoxia due to severe IPF - currently off BiPAP---now off HFNC and on 5 liter [place] -po steroids, nebulizers.  -Pulmonary consultation appreciated  2. Acute kidney injury stable  3. Elevated troponin with nonischemic - Patient already takes aspirin, Coreg.  4. History of diabetes Home mes 5. History of hypertension-continue Norvasc, Coreg, hydralazine 6. History of aortic valve replacement with therapeutic INR-continue Coumadin per pharmacy 7. History of diabetic neuropathy-continue Neurontin  If continues to improve [place] Care Management/Social Worker. Management plans discussed with the patient, family and they are in agreement.  CODE STATUS: full  DVT Prophylaxis: lovenox TOTAL TIME TAKING CARE OF  THIS PATIENT: 30 minutes.  >50% time spent on counselling and coordination of care  POSSIBLE D/C IN1 DAYS, DEPENDING ON CLINICAL CONDITION.  Note: This dictation was prepared with Dragon dictation along with smaller phrase technology. Any transcriptional errors that result from this process are unintentional.  Jazmyn Offner M.D   Between 7am to 6pm - Pager - [phone]  After 6pm go to [email or web address] - password [email or web address] [facility]  [facility]  Office  [phone]  CC: Primary care physician; Julien Nordmannimothy Gollan, MD

## 2015-12-17 NOTE — Progress Notes (Signed)
Chaplain was making his rounds and visited with pt in room Ic1. Provided emotional support and a prayer.    12/17/15 1700  Clinical Encounter Type  Visited With Patient  Visit Type Initial;Spiritual support  Referral From Nurse  Spiritual Encounters  Spiritual Needs Prayer;Emotional

## 2015-12-18 ENCOUNTER — Other Ambulatory Visit: Payer: Self-pay

## 2015-12-18 LAB — GLUCOSE, CAPILLARY
GLUCOSE-CAPILLARY: 262 mg/dL — AB (ref 65–99)
Glucose-Capillary: 139 mg/dL — ABNORMAL HIGH (ref 65–99)

## 2015-12-18 LAB — PROTIME-INR
INR: 2.7
PROTHROMBIN TIME: 29.2 s — AB (ref 11.4–15.2)

## 2015-12-18 MED ORDER — WARFARIN SODIUM 4 MG PO TABS
4.0000 mg | ORAL_TABLET | Freq: Every day | ORAL | Status: DC
  Filled 2015-12-18: qty 1

## 2015-12-18 MED ORDER — LEVOFLOXACIN 250 MG PO TABS: 250.0000 mg | ORAL_TABLET | Freq: Every day | ORAL | 0 refills | Status: AC

## 2015-12-18 MED ORDER — PREDNISONE 5 MG PO TABS: 40.0000 mg | ORAL_TABLET | Freq: Every day | ORAL | 0 refills | Status: AC

## 2015-12-18 MED ORDER — WARFARIN SODIUM 5 MG PO TABS: ORAL_TABLET | ORAL | 0 refills | Status: AC

## 2015-12-18 NOTE — Discharge Instructions (Signed)
Use your oxygen and nebs as before

## 2015-12-18 NOTE — Discharge Summary (Signed)
[facility] - [place] at [facility]   PATIENT NAME: Adam Ibarra    MR#:  [id number]  DATE OF BIRTH:  February 10, 1951  DATE OF ADMISSION:  12/14/2015 ADMITTING PHYSICIAN: Tonye Royalty, DO  DATE OF DISCHARGE: 12/18/15  PRIMARY CARE PHYSICIAN: Julien Nordmann, MD    ADMISSION DIAGNOSIS:  Pitting edema [R60.9] Acute on chronic respiratory failure with hypoxia (HCC) [J96.21] Non-ST elevation myocardial infarction (NSTEMI), type 2 (HCC) [I21.4]  DISCHARGE DIAGNOSIS:  Acute on chronic respiraotry fialure Chronic IPF Chronic home oxygen H/o AVR on warfarin  SECONDARY DIAGNOSIS:   Past Medical History:  Diagnosis Date  . Aortic stenosis    a. 01/2012 s/p AVR with SJM 23mm mechanical valve.  . Arthritis   . Chronic venous insufficiency 05/24/2012  . Coronary artery disease    a. 01/2012 s/p CABG x 3 (LIMA->LAD, VG->OM1,VG->PDA)  . Hyperlipidemia   . Hypertension   . Nephrolithiasis   . Obesity   . Sleep apnea    a. on CPAP  . Syncope    a. 01/2012 - presumed to be orthostatic in nature.  . Type II diabetes mellitus (HCC)     HOSPITAL COURSE:   *65 y.o.malewith a history of Pulmonary fibrosis, coronary artery disease, diabetes, history of aortic stenosis status post valve replacement with mechanical valve on Coumadin, sleep apneanow being admitted with: 1. Acute on chronic respiratory failure with hypoxia due to severe IPF - currently off BiPAP---now off HFNC and on 5 liter [place]. sats stable -po steroids, nebulizers.  -Pulmonary consultation appreciated  2. Acute on chronic kidney injury stable  3. Elevatedtroponin with nonischemic - Patient already takes aspirin, Coreg.  4. History of diabetes Home meds  5. History of hypertension-continue Norvasc, Coreg, hydralazine  6. History of aortic valve replacement with therapeutic INR-continue Coumadin per pharmacy  7. History of diabetic neuropathy-continue Neurontin  Overall stbale and at  baseline from respiratory stand point D/c home CONSULTS OBTAINED:  Treatment Team:  Antonieta Iba, MD  DRUG ALLERGIES:   Allergies  Allergen Reactions  . Ivp Dye [Iodinated Diagnostic Agents] Itching    Can tolerate if takes benadryl    DISCHARGE MEDICATIONS:   Current Discharge Medication List    START taking these medications   Details  levofloxacin (LEVAQUIN) 250 MG tablet Take 1 tablet (250 mg total) by mouth daily. Qty: 3 tablet, Refills: 0    predniSONE (DELTASONE) 5 MG tablet Take 8 tablets (40 mg total) by mouth daily with breakfast. Start at 40 mg taper by 5 mg daily then stop Qty: 36 tablet, Refills: 0      CONTINUE these medications which have NOT CHANGED   Details  albuterol (PROVENTIL HFA;VENTOLIN HFA) 108 (90 BASE) MCG/ACT inhaler Inhale 2 puffs into the lungs every 4 (four) hours as needed for wheezing or shortness of breath.    amitriptyline (ELAVIL) 25 MG tablet Take 75-100 mg by mouth at bedtime.     amLODipine (NORVASC) 2.5 MG tablet Take 2.5 mg by mouth daily.    aspirin EC 81 MG tablet Take 81 mg by mouth daily.    carvedilol (COREG) 25 MG tablet Take 25 mg by mouth 2 (two) times daily with a meal.    cyanocobalamin (,VITAMIN B-12,) 1000 MCG/ML injection Inject 1,000 mcg into the muscle every 30 (thirty) days. Every 1st day of the month    Dulaglutide (TRULICITY) 1.5 MG/0.5ML SOPN Inject 1.5 mg into the skin once a week. sundays    Ferrous Fumarate-Vitamin C 65-125  MG TABS Take by mouth.    gabapentin (NEURONTIN) 100 MG capsule Take 100 mg by mouth at bedtime.    glimepiride (AMARYL) 4 MG tablet Take 4 mg by mouth 2 (two) times daily with a meal.     hydrALAZINE (APRESOLINE) 10 MG tablet Take 10 mg by mouth 2 (two) times daily.     potassium chloride (K-DUR) 10 MEQ tablet Take 10 mEq by mouth daily.    warfarin (COUMADIN) 5 MG tablet TAKE AS DIRECTED BY ANTICOAGULATION CLINIC Qty: 90 tablet, Refills: 1        If you experience  worsening of your admission symptoms, develop shortness of breath, life threatening emergency, suicidal or homicidal thoughts you must seek medical attention immediately by calling 911 or calling your MD immediately  if symptoms less severe.  You Must read complete instructions/literature along with all the possible adverse reactions/side effects for all the Medicines you take and that have been prescribed to you. Take any new Medicines after you have completely understood and accept all the possible adverse reactions/side effects.   Please note  You were cared for by a hospitalist during your hospital stay. If you have any questions about your discharge medications or the care you received while you were in the hospital after you are discharged, you can call the unit and asked to speak with the hospitalist on call if the hospitalist that took care of you is not available. Once you are discharged, your primary care physician will handle any further medical issues. Please note that NO REFILLS for any discharge medications will be authorized once you are discharged, as it is imperative that you return to your primary care physician (or establish a relationship with a primary care physician if you do not have one) for your aftercare needs so that they can reassess your need for medications and monitor your lab values. Today   SUBJECTIVE   Doing well  VITAL SIGNS:  Blood pressure (!) 141/76, pulse (!) 107, temperature 97.6 F (36.4 C), temperature source Oral, resp. rate (!) 26, height 6' (1.829 m), weight 116.5 kg (256 lb 13.4 oz), SpO2 99 %.  I/O:   Intake/Output Summary (Last 24 hours) at 12/18/15 0756 Last data filed at 12/18/15 0733  Gross per 24 hour  Intake              566 ml  Output              950 ml  Net             -384 ml    PHYSICAL EXAMINATION:  GENERAL:  65 y.o.-year-old patient lying in the bed with no acute distress.  EYES: Pupils equal, round, reactive to light and  accommodation. No scleral icterus. Extraocular muscles intact.  HEENT: Head atraumatic, normocephalic. Oropharynx and nasopharynx clear.  NECK:  Supple, no jugular venous distention. No thyroid enlargement, no tenderness.  LUNGS: Normal breath sounds bilaterally, no wheezing, fine rales, no rhonchi or crepitation. No use of accessory muscles of respiration.  CARDIOVASCULAR: S1, S2 normal. No murmurs, rubs, or gallops.  ABDOMEN: Soft, non-tender, non-distended. Bowel sounds present. No organomegaly or mass.  EXTREMITIES: No pedal edema, cyanosis, or clubbing.  NEUROLOGIC: Cranial nerves II through XII are intact. Muscle strength 5/5 in all extremities. Sensation intact. Gait not checked.  PSYCHIATRIC: The patient is alert and oriented x 3.  SKIN: No obvious rash, lesion, or ulcer.   DATA REVIEW:   CBC   Recent Labs Lab  12/17/15 0308  WBC 19.2*  HGB 11.1*  HCT 34.0*  PLT 273    Chemistries   Recent Labs Lab 12/15/15 0131 12/15/15 0659  12/17/15 0308  NA 134* 136  < > 137  K 4.9 5.1  < > 4.7  CL 102 104  < > 102  CO2 27 27  < > 29  GLUCOSE 150* 132*  < > 197*  BUN 31* 32*  < > 71*  CREATININE 1.70* 1.65*  < > 2.03*  CALCIUM 8.2* 8.4*  < > 8.3*  MG 1.7  --   --   --   AST  --  19  --   --   ALT  --  14*  --   --   ALKPHOS  --  53  --   --   BILITOT  --  0.8  --   --   < > = values in this interval not displayed.  Microbiology Results   Recent Results (from the past 240 hour(s))  MRSA PCR Screening     Status: None   Collection Time: 12/15/15 12:50 AM  Result Value Ref Range Status   MRSA by PCR NEGATIVE NEGATIVE Final    Comment:        The GeneXpert MRSA Assay (FDA approved for NASAL specimens only), is one component of a comprehensive MRSA colonization surveillance program. It is not intended to diagnose MRSA infection nor to guide or monitor treatment for MRSA infections.   Culture, expectorated sputum-assessment     Status: None   Collection Time:  12/16/15  2:41 PM  Result Value Ref Range Status   Specimen Description SPUTUM  Final   Special Requests Normal  Final   Sputum evaluation THIS SPECIMEN IS ACCEPTABLE FOR SPUTUM CULTURE  Final   Report Status 12/16/2015 FINAL  Final  Culture, respiratory (NON-Expectorated)     Status: None (Preliminary result)   Collection Time: 12/16/15  2:41 PM  Result Value Ref Range Status   Specimen Description SPUTUM  Final   Special Requests Normal Reflexed from [id number]  Final   Gram Stain   Final    ABUNDANT WBC PRESENT,BOTH PMN AND MONONUCLEAR FEW SQUAMOUS EPITHELIAL CELLS PRESENT FEW GRAM POSITIVE COCCI IN PAIRS RARE GRAM VARIABLE ROD    Culture   Final    CULTURE REINCUBATED FOR BETTER GROWTH Performed at [facility]    Report Status PENDING  Incomplete    RADIOLOGY:  Dg Chest Port 1 View  Result Date: 12/17/2015 CLINICAL DATA:  Respiratory failure, history of coronary artery disease, aortic stenosis, previous CABG and aortic valve replacement. EXAM: PORTABLE CHEST 1 VIEW COMPARISON:  Portable chest x-ray of December 16, 2015 FINDINGS: The patient is positioned in a somewhat more chronic manner. The lungs remain mildly hypoinflated. The interstitial markings remain increased bilaterally greatest in the left lower lung. The cardiac silhouette is enlarged but the margins are indistinct. There are post CABG changes. There is no pleural effusion or pneumothorax. IMPRESSION: Stable appearance of the chest since yesterday's study. Persistent increased interstitial densities bilaterally may reflect interstitial edema or pneumonia superimposed on underlying chronic lung disease. Electronically Signed   By: David  [place] M.D.   On: 12/17/2015 07:27     Management plans discussed with the patient, family and they are in agreement.  CODE STATUS:     Code Status Orders        Start     Ordered   12/15/15 0031  Limited resuscitation (  code)  Continuous    Question Answer Comment  In the  event of cardiac or respiratory ARREST: Initiate Code Blue, Call Rapid Response No   In the event of cardiac or respiratory ARREST: Perform CPR No   In the event of cardiac or respiratory ARREST: Perform Intubation/Mechanical Ventilation Yes   In the event of cardiac or respiratory ARREST: Use NIPPV/BiPAp only if indicated Yes   In the event of cardiac or respiratory ARREST: Administer ACLS medications if indicated No   In the event of cardiac or respiratory ARREST: Perform Defibrillation or Cardioversion if indicated No      12/15/15 0030    Code Status History    Date Active Date Inactive Code Status Order ID Comments User Context   08/12/2015  3:32 AM 08/13/2015  7:34 PM Full Code [id number]  Gery Prayebby Crosley, MD Inpatient   02/06/2012  2:25 PM 02/08/2012 12:12 PM Full Code [id number]  Dell PontoSteven Michael Shaw, RN Inpatient      TOTAL TIME TAKING CARE OF THIS PATIENT: 40 minutes.    Jowan Skillin M.D on 12/18/2015 at 7:56 AM  Between 7am to 6pm - Pager - [phone] After 6pm go to [email or web address] - password [email or web address] [facility]  [facility]  Office  [phone]  CC: Primary care physician; Julien Nordmannimothy Gollan, MD

## 2015-12-18 NOTE — Care Management (Signed)
Spoke with patient. He continues to work 40 hours per week. No RNCM need identified.

## 2015-12-18 NOTE — Progress Notes (Signed)
ANTICOAGULATION CONSULT NOTE -follow up Consult  Pharmacy Consult for warfarin Indication: AF/ mechanical AVR 2013  Allergies  Allergen Reactions  . Ivp Dye [Iodinated Diagnostic Agents] Itching    Can tolerate if takes benadryl    Patient Measurements: Height: 6' (182.9 cm) Weight: 256 lb 13.4 oz (116.5 kg) IBW/kg (Calculated) : 77.6 Heparin Dosing Weight:   Vital Signs: Temp: 97.5 F (36.4 C) (10/31 0843) Temp Source: Oral (10/31 0843) BP: 139/62 (10/31 0843) Pulse Rate: 81 (10/31 0843)  Labs:  Recent Labs  12/15/15 1155 12/16/15 0428 12/17/15 0308 12/18/15 0411  HGB  --  11.2* 11.1*  --   HCT  --  33.9* 34.0*  --   PLT  --  264 273  --   LABPROT  --  46.9* 37.5* 29.2*  INR  --  4.88* 3.69 2.70  CREATININE  --  2.15* 2.03*  --   TROPONINI <0.03  --   --   --     Estimated Creatinine Clearance: 47.2 mL/min (by C-G formula based on SCr of 2.03 mg/dL (H)).   Medical History: Past Medical History:  Diagnosis Date  . Aortic stenosis    a. 01/2012 s/p AVR with [facility] [phone] SmoMarland KitchenSh70[facility], [phone] KitchenSh89[facility] [phone] KitchenSh67[phone] LMarland KitchenSh36[phone] Coun48964 Marland KitchenSh68[facility] [place] [id number] KitchenSh49[facility] [phone] KitchenSh65[facility]8490RoMarland Kitchensl[facility] [phone] GMarland KitchenSh62[facility] [phone] OlMarland KitchenSh19[facility]3441m3952-519-4680Warm SS8782 Marland KitchenSh47[facility]5450m5952-719-2894353Marland KitchenSh39[facility] [phone] CGe34444 HMarland KitchenSh74[facility] [phone] 54Marland KitchenSh44[phone] KitchenSh58[facility] [phone] E. InvMarland KitchenSh8[phone] CleMarland KitchenSh53[facility] Cen7828m5608 511 0320Beverl60963CMarland KitchenSh62[phone] NMarland KitchenSh26[facility] [place]680m3845-246-2187CRo4Marland KitchenSh61[facility]25w0dPimpleSt.valve.  . Arthritis   . Chronic venous insufficiency 05/24/2012  . Coronary artery disease    a. 01/2012 s/p CABG x 3 (LIMA->LAD, VG->OM1,VG->PDA)  . Hyperlipidemia   . Hypertension   . Nephrolithiasis   . Obesity   . Sleep apnea    a. on CPAP  . Syncope    a. 01/2012 - presumed to be orthostatic in nature.  . Type II diabetes mellitus (HCC)     Medications:   Assessment: 65 yom cc SOB with PMH aortic stenosis sp AVR in 2013 on VKA. Recent VKA dose adjustments per PCP for subtherapeutic INR.  Per Med Rec: pt on Warfarin 5 mg Mon,Tues, Thur, Fri, Sat and Warfarin 2.5 mg Sun, Wed.   10/27 INR 3.69  Noted patient took home dose 10/27 at 1100 10/28 INR 5.43  Dose Held 10/29 INR 4.88  Dose Held 10/30   INR 3.69  Dose Held 10/31   INR 2.70   Goal of Therapy:  INR 2-3 (for mechanical aortic valve per ACCP) Monitor platelets by anticoagulation protocol: Yes   Plan:  Will  restart warfarin at lower dose of 4mg  daily (~85% of previous dose). Will continue with daily INR checks. Patient remains on Levaquin which will affect INR.   Danaja Lasota, PharmD, BCPS 12/18/2015 10:31 AM

## 2015-12-19 ENCOUNTER — Telehealth: Payer: Self-pay

## 2015-12-19 LAB — CULTURE, RESPIRATORY W GRAM STAIN: Culture: NORMAL

## 2015-12-19 LAB — CULTURE, RESPIRATORY: SPECIAL REQUESTS: NORMAL

## 2015-12-19 NOTE — Telephone Encounter (Signed)
Called spoke with pt, pt was admitted to hospital 10/27-10/31 INR 2.7 at discharge.  Pt had appt on 12/19/15 which I rescheduled to 12/26/15 at 8am.  Pt was discharged on previous home dosage of Warfarin, as well as Levaquin 250mg  and a Prednisone 5mg  taper.  Pt states he is seeing his primary care MD on Friday 12/21/15 and he will have an INR checked there on Friday to make sure abx and steroid is not increasing his INR.  Then pt will return to our [facility] on next Wednesday for follow-up as well.  Advised pt to notify us of INR for our records, pt verbalized understanding and agreed to do so.

## 2015-12-26 ENCOUNTER — Ambulatory Visit (INDEPENDENT_AMBULATORY_CARE_PROVIDER_SITE_OTHER): Payer: 59

## 2015-12-26 DIAGNOSIS — Z952 Presence of prosthetic heart valve: Secondary | ICD-10-CM

## 2015-12-26 DIAGNOSIS — Z5181 Encounter for therapeutic drug level monitoring: Secondary | ICD-10-CM

## 2015-12-26 LAB — POCT INR: INR: 2.8

## 2016-02-18 DEATH — deceased

## 2016-03-14 ENCOUNTER — Other Ambulatory Visit: Payer: Self-pay | Admitting: Cardiovascular Disease

## 2016-03-14 ENCOUNTER — Telehealth: Payer: Self-pay | Admitting: *Deleted

## 2016-03-14 DIAGNOSIS — Z5181 Encounter for therapeutic drug level monitoring: Secondary | ICD-10-CM

## 2016-03-14 DIAGNOSIS — Z952 Presence of prosthetic heart valve: Secondary | ICD-10-CM

## 2016-03-14 NOTE — Telephone Encounter (Signed)
Adam PeerDiana Birnbaum call us back to say spouse died 02/03/2016.

## 2016-03-14 NOTE — Telephone Encounter (Signed)
Review for refill. 

## 2017-07-24 IMAGING — CT CT CHEST W/O CM
2 of 3 series · 15 of 46 positions shown, 17 images · non-contrast
Comparison: Chest radiograph performed 08/11/2015

CLINICAL DATA: Acute onset of dyspnea, generalized chest pain and
fever. Recently diagnosed with idiopathic pulmonary fibrosis.
Initial encounter.

EXAM:
CT CHEST WITHOUT CONTRAST
TECHNIQUE: Multidetector CT imaging of the chest was performed following the
standard protocol without IV contrast.

[Series 2: routine chest wo · axial · 0.77mm/px · z∈[-380,-110]mm · 12 of 63 slices shown, 14 images]
[im 5/63  soft-tissue]
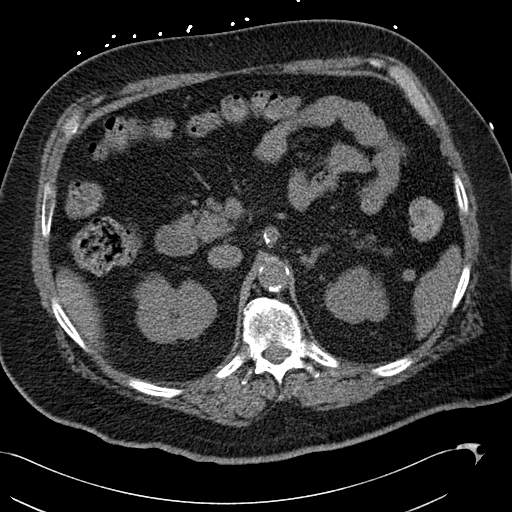
[im 5/63  bone]
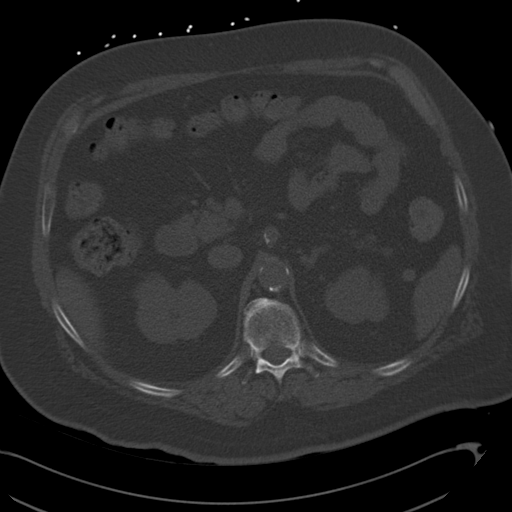
[im 9/63  soft-tissue]
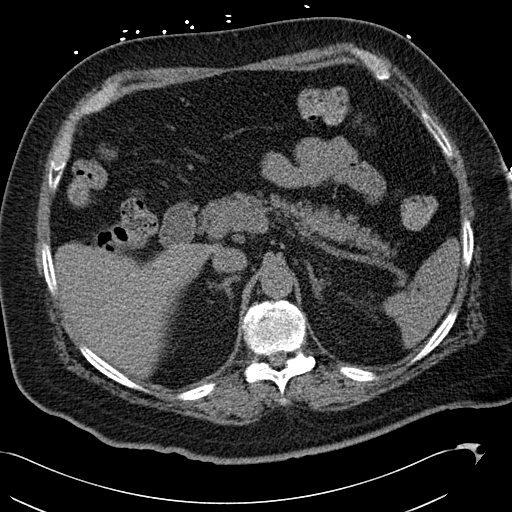
[im 15/63  soft-tissue]
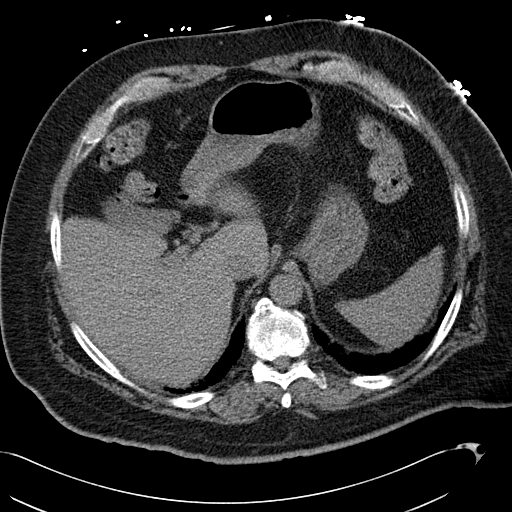
[im 19/63  soft-tissue]
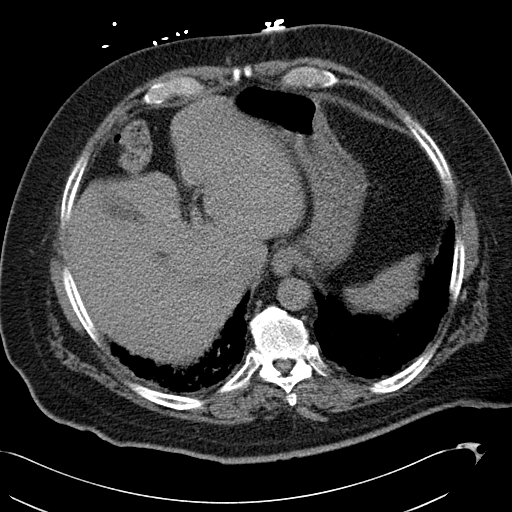
[im 25/63  soft-tissue]
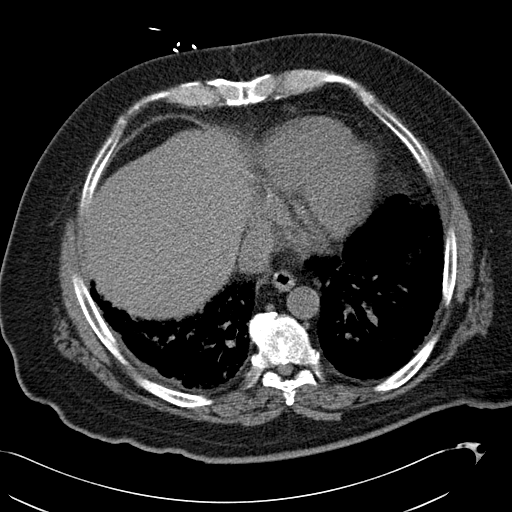
[im 29/63  soft-tissue]
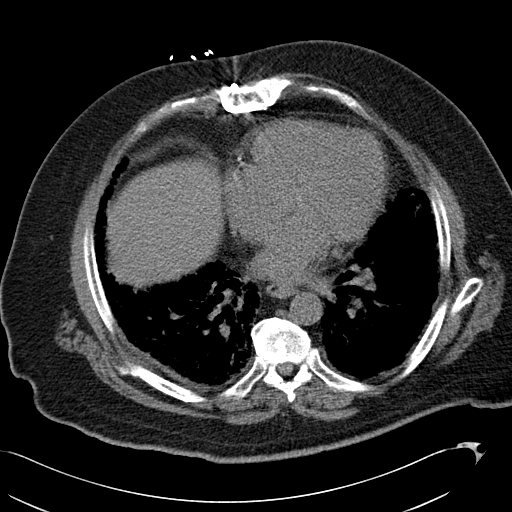
[im 35/63  soft-tissue]
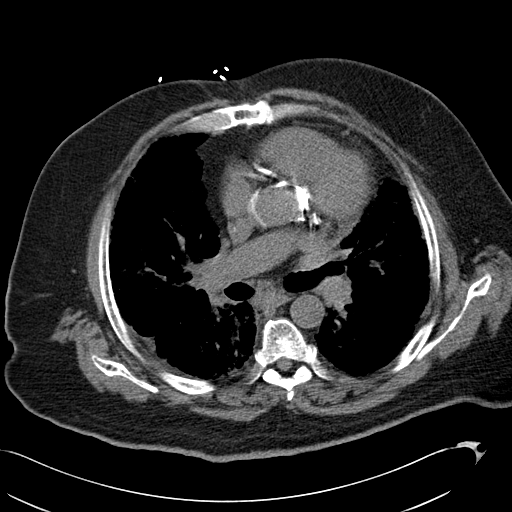
[im 39/63  soft-tissue]
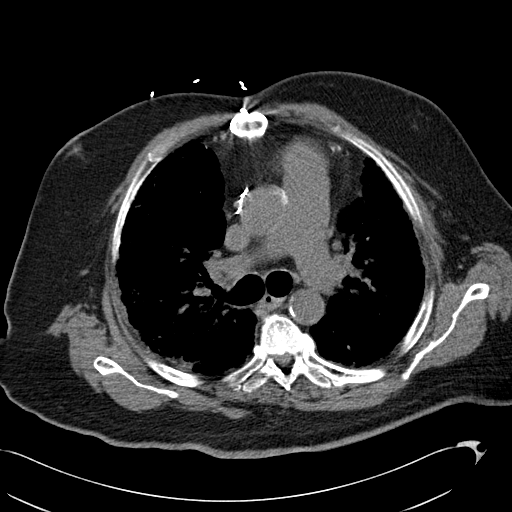
[im 45/63  soft-tissue]
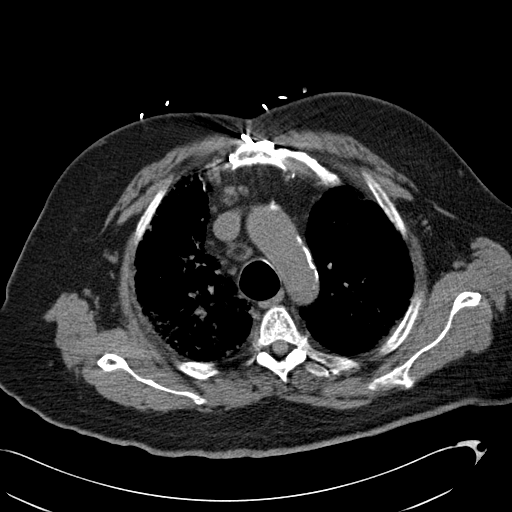
[im 45/63  bone]
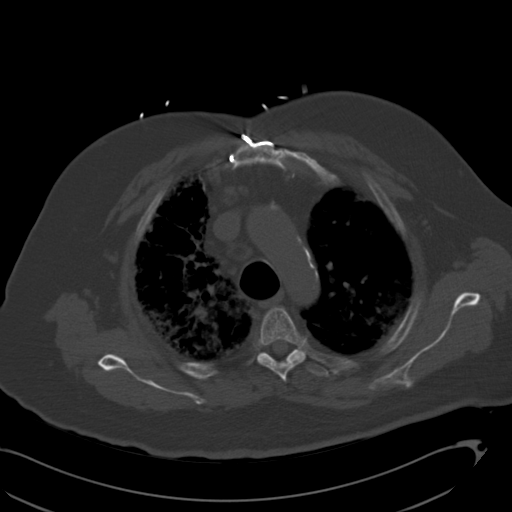
[im 49/63  soft-tissue]
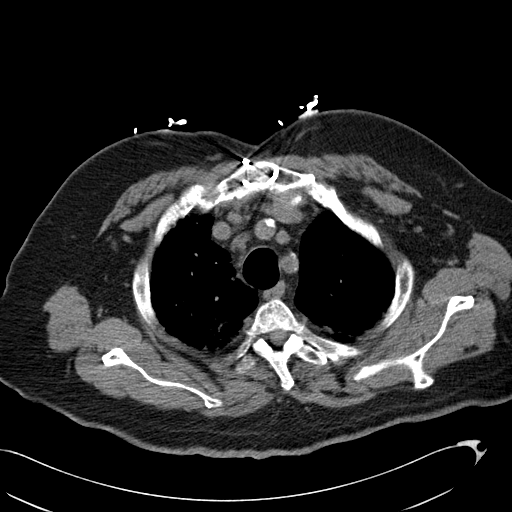
[im 55/63  soft-tissue]
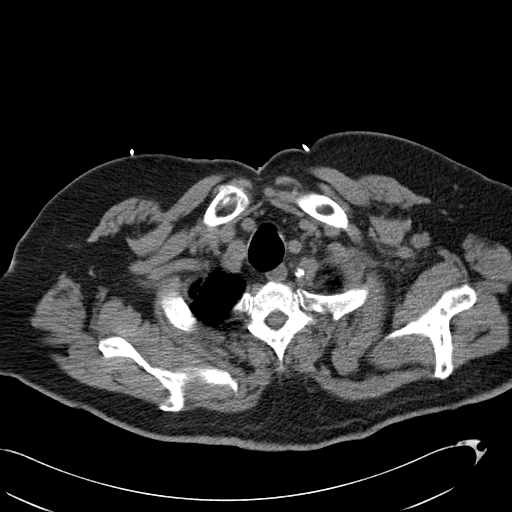
[im 59/63  soft-tissue]
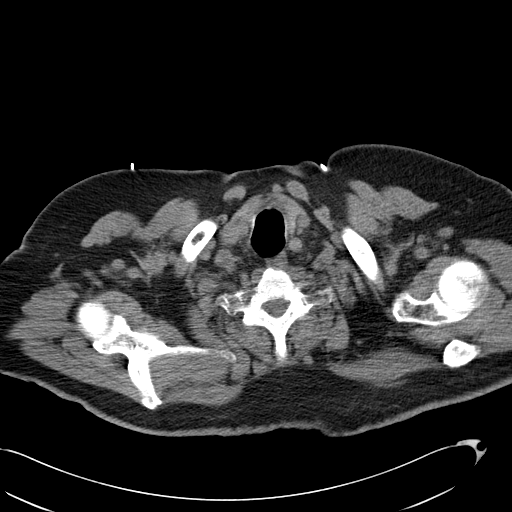

[Series 5: routine chest wo cor · coronal · 0.61mm/px · 3 of 152 slices shown]
[im 51/152  soft-tissue]
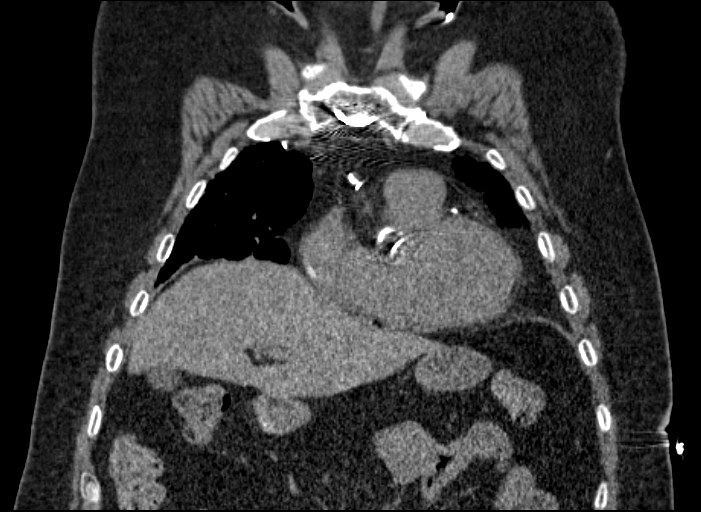
[im 68/152  soft-tissue]
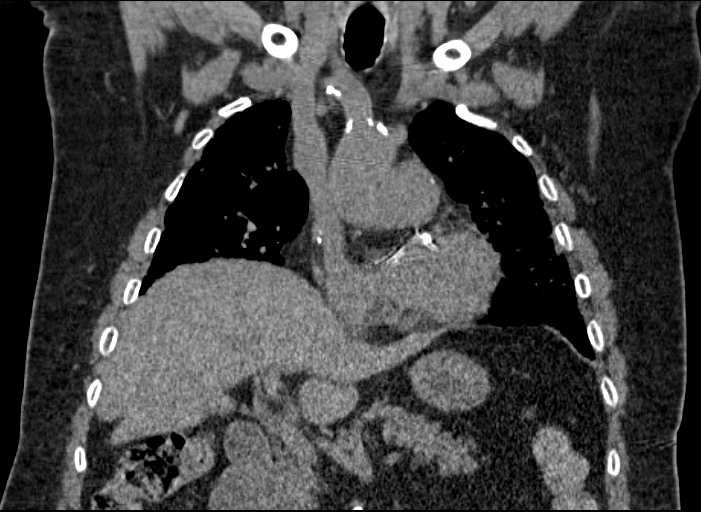
[im 84/152  soft-tissue]
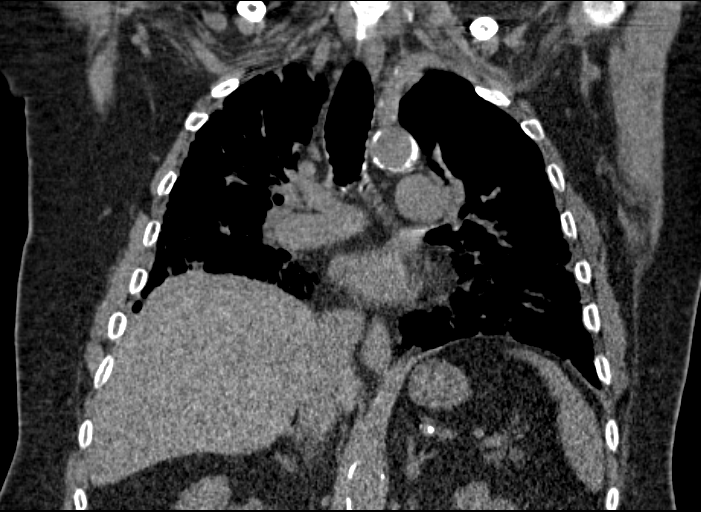

[15 of 46 positions shown; findings below may reference images not displayed]

FINDINGS: Cardiovascular: An aortic valve replacement is noted. Diffuse
coronary artery calcifications are seen. The patient is status post
median sternotomy. Scattered calcification is noted along the
thoracic aorta and great vessels.

Mediastinum: The mediastinum is grossly unremarkable in appearance.
No definite mediastinal lymphadenopathy is seen, though this is
difficult to fully assess without contrast. No pericardial effusion
is identified. The patient is status post median sternotomy.

Lungs/Pleura: Diffuse bilateral pulmonary fibrotic change is noted,
with hazy ground-glass opacity and underlying extensive
bronchiectasis. Findings are compatible with the patient's diagnosis
of idiopathic pulmonary fibrosis. No definite superimposed focal
airspace consolidation is seen, though without prior CT for
comparison, it cannot be excluded.

Upper Abdomen: The liver and spleen are unremarkable in appearance.
The gallbladder is within normal limits. The visualized portions of
the pancreas, adrenal glands and kidneys are grossly unremarkable,
aside from a 4 mm nonobstructing stone at the upper pole of the left
kidney. Scattered calcification is noted along the proximal
abdominal aorta and its branches, including along the superior
mesenteric artery.

Musculoskeletal: No acute osseous abnormalities are identified.
IMPRESSION: 1. Diffuse bilateral pulmonary fibrotic change, with hazy
ground-glass opacity and underlying extensive bronchiectasis.
Findings compatible with the patient's diagnosis of idiopathic
pulmonary fibrosis. No definite superimposed focal airspace
consolidation seen, though without prior CT for comparison, it
cannot be excluded.
2. Diffuse coronary artery calcifications seen.
3. 4 mm nonobstructing stone at the upper pole of the left kidney.
4. Scattered calcification along the thoracic and proximal abdominal
aorta and its branches, including along the superior mesenteric
artery.

## 2017-11-25 IMAGING — DX DG CHEST 1V PORT
1 series · 1 of 1 positions shown · non-contrast
Comparison: 08/11/2015

CLINICAL DATA: Shortness of breath for 3 days. History of pulmonary
fibrosis.

EXAM:
PORTABLE CHEST 1 VIEW

[chest ap]
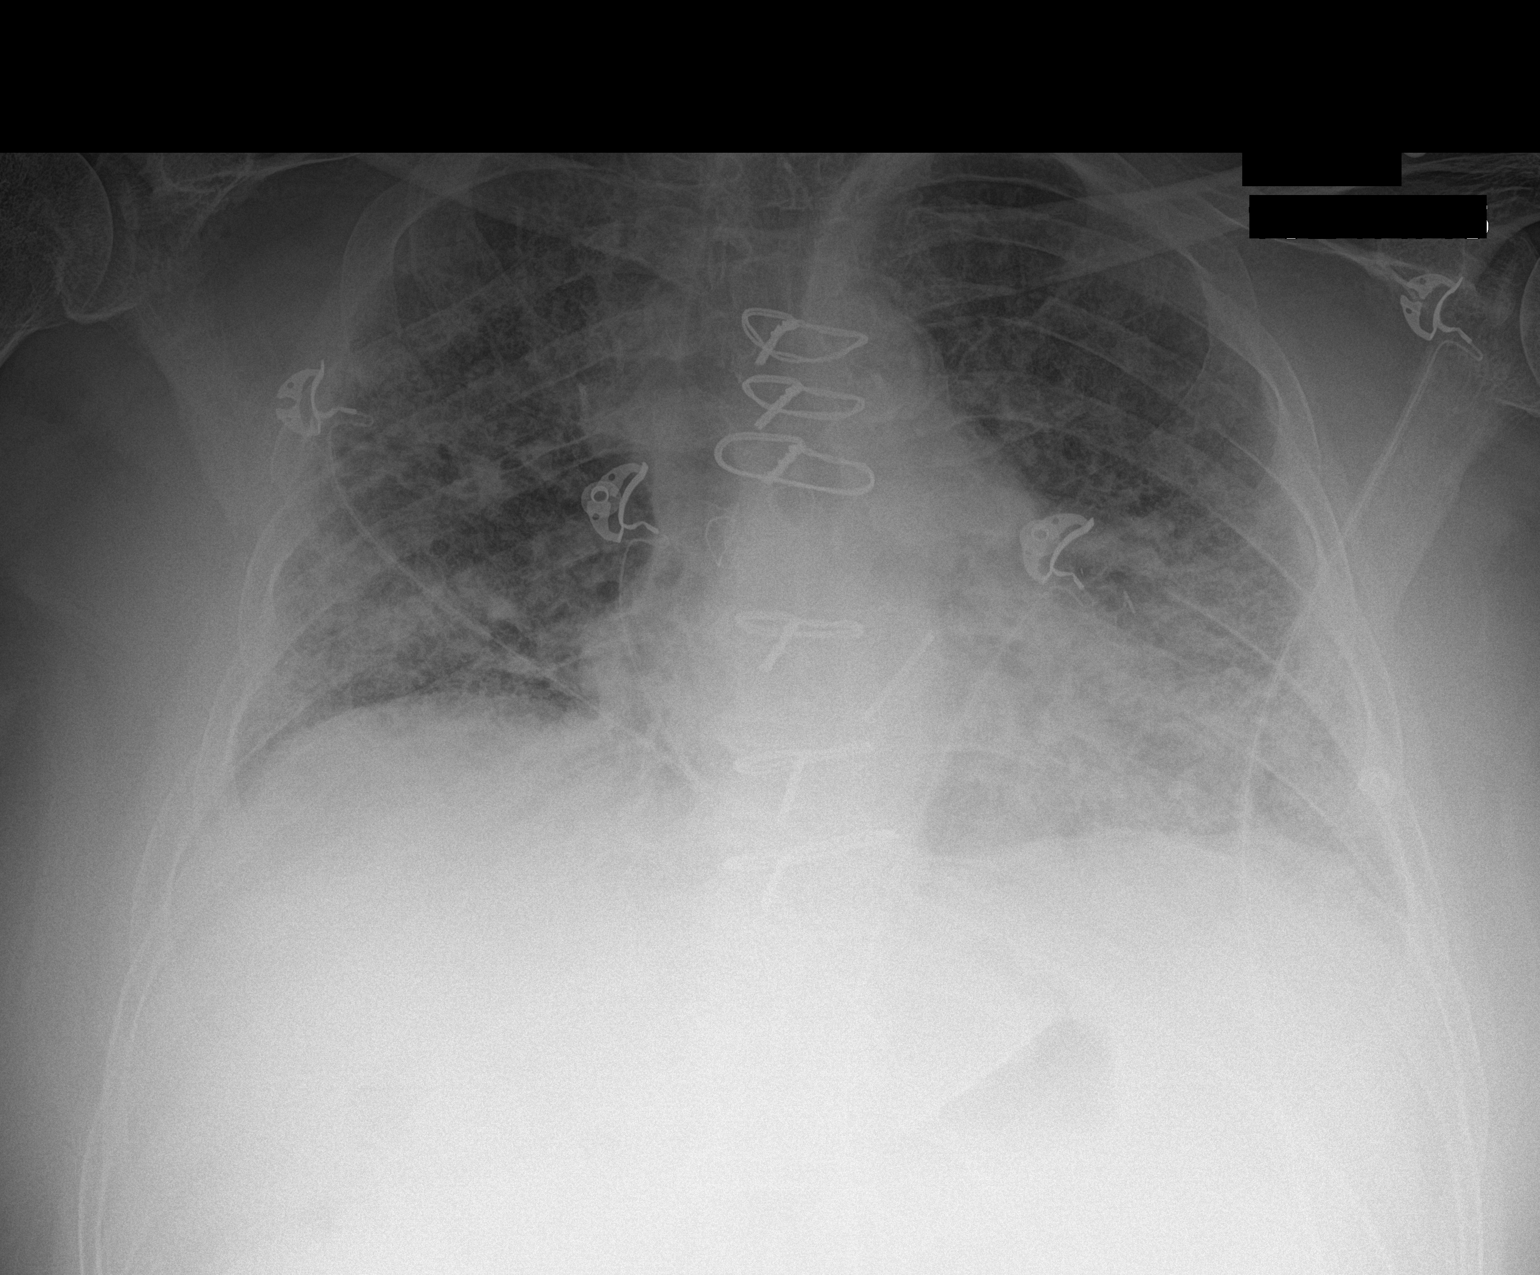

[1 of 1 positions shown; findings below may reference images not displayed]

FINDINGS: Diffuse opacities throughout the lungs are again noted, stable since
prior study compatible with pulmonary fibrosis. No definite acute
process. Prior CABG. Heart is normal size. No visible effusions or
acute bony abnormality.
IMPRESSION: Diffuse chronic interstitial opacities throughout the lungs
compatible with pulmonary fibrosis. No definite acute process. No
change.

## 2017-11-28 IMAGING — DX DG CHEST 1V PORT
1 series · 1 of 1 positions shown · non-contrast
Comparison: Portable chest x-ray December 16, 2015

CLINICAL DATA: Respiratory failure, history of coronary artery
disease, aortic stenosis, previous CABG and aortic valve
replacement.

EXAM:
PORTABLE CHEST 1 VIEW

[chest ap]
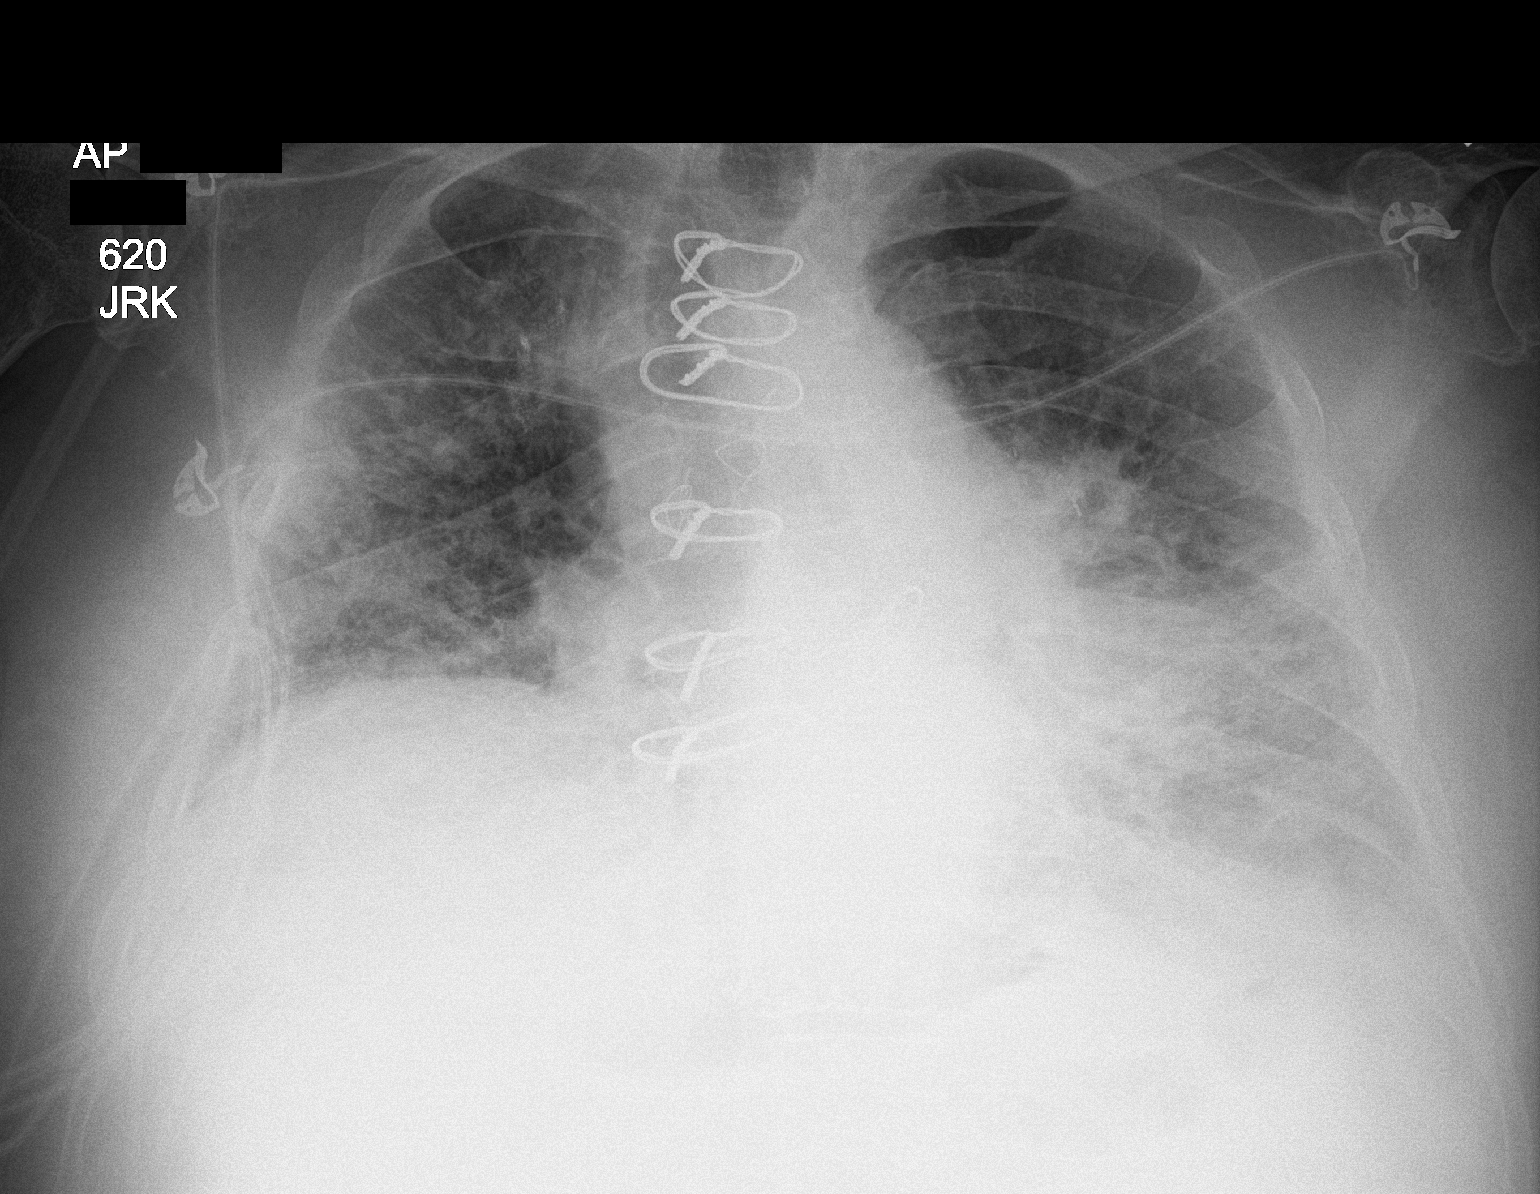

[1 of 1 positions shown; findings below may reference images not displayed]

FINDINGS: The patient is positioned in a somewhat more chronic manner. The
lungs remain mildly hypoinflated. The interstitial markings remain
increased bilaterally greatest in the left lower lung. The cardiac
silhouette is enlarged but the margins are indistinct. There are
post CABG changes. There is no pleural effusion or pneumothorax.
IMPRESSION: Stable appearance of the chest since yesterday's study. Persistent
increased interstitial densities bilaterally may reflect
interstitial edema or pneumonia superimposed on underlying chronic
lung disease.

## 2018-06-25 ENCOUNTER — Telehealth: Payer: Self-pay

## 2018-06-25 NOTE — Telephone Encounter (Signed)
Called patient from recall list.  No answer. Invalid Number.  This is the second attempt per recall list.

## 2018-06-29 NOTE — Telephone Encounter (Signed)
Called patient.  No answer. Invalid Number.  This is the 3rd attempt per recall list.  Will delete recall.

## 2018-10-15 IMAGING — US US EXTREM LOW VENOUS BILAT
1 series · 13 of 24 positions shown · non-contrast
Comparison: None.

CLINICAL DATA: Bilateral lower extremity edema.  Evaluate for DVT.



[Series 1: us extrem low venous bilat · 0.08mm/px · 13 of 62 slices shown]
[im 1/62]
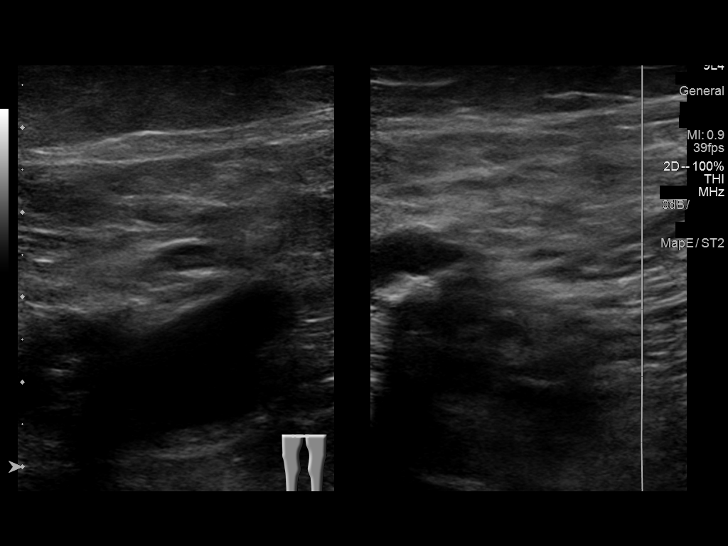
[im 6/62]
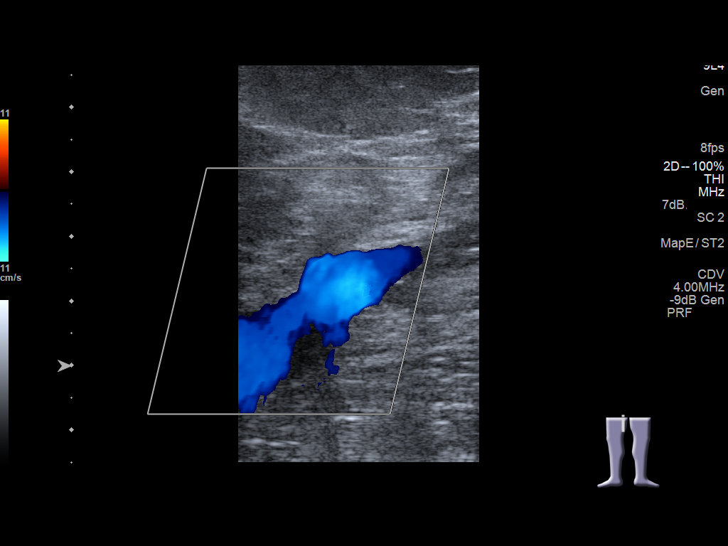
[im 11/62]
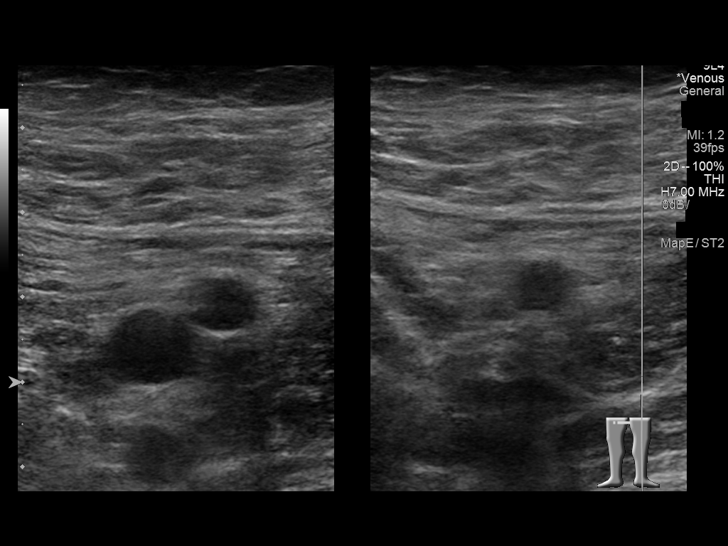
[im 16/62]
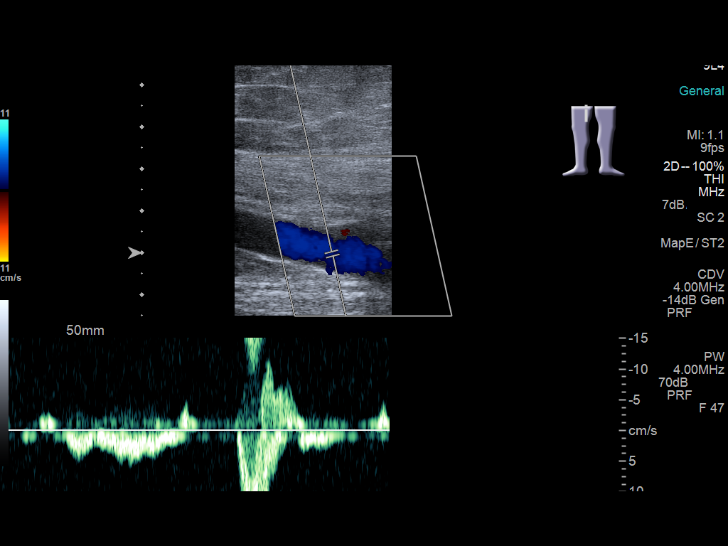
[im 22/62]
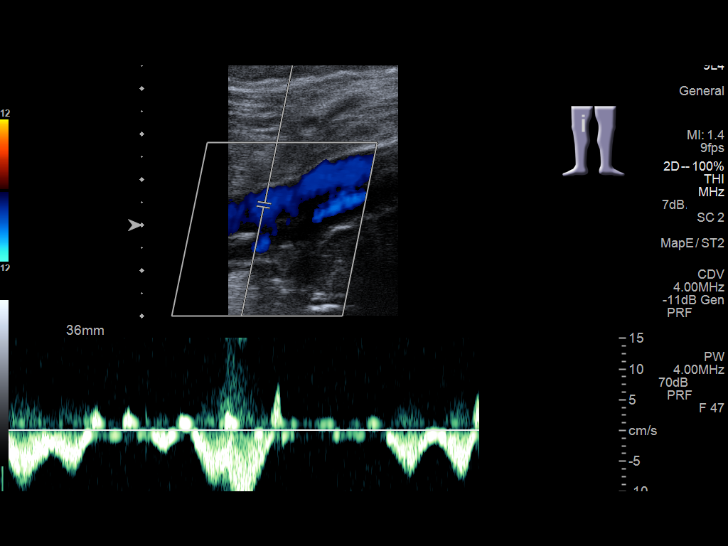
[im 27/62]
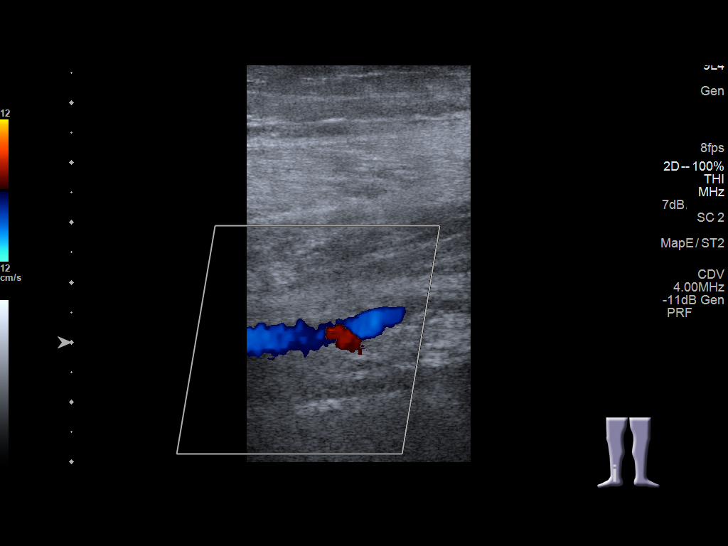
[im 32/62]
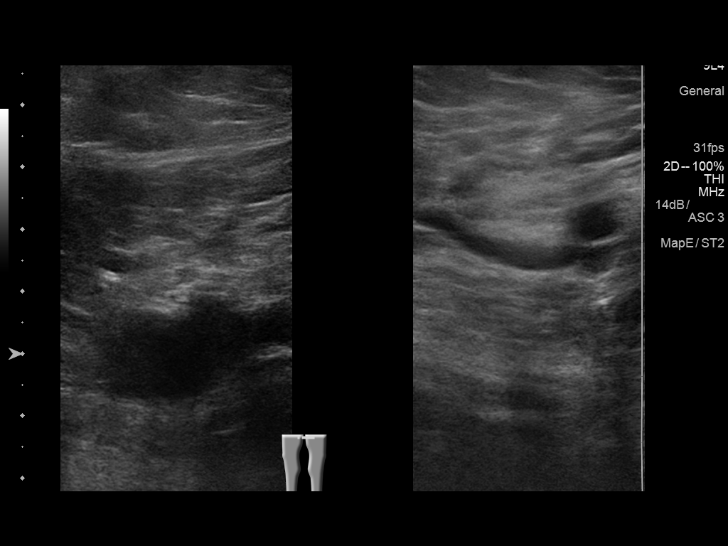
[im 35/62]
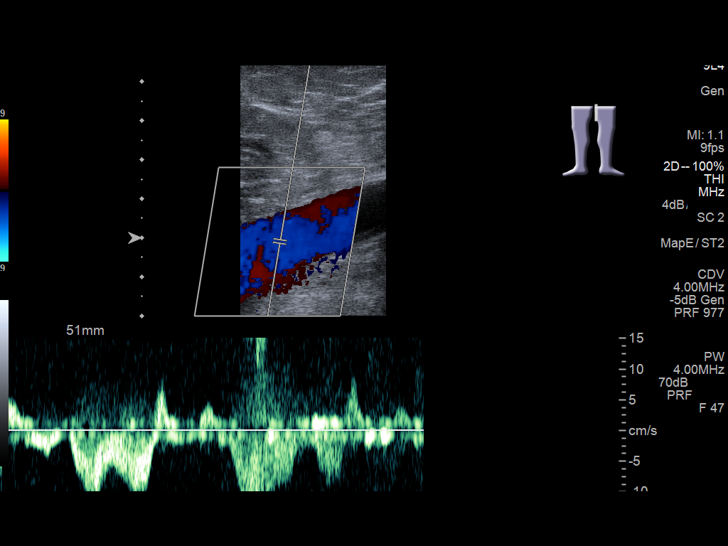
[im 40/62]
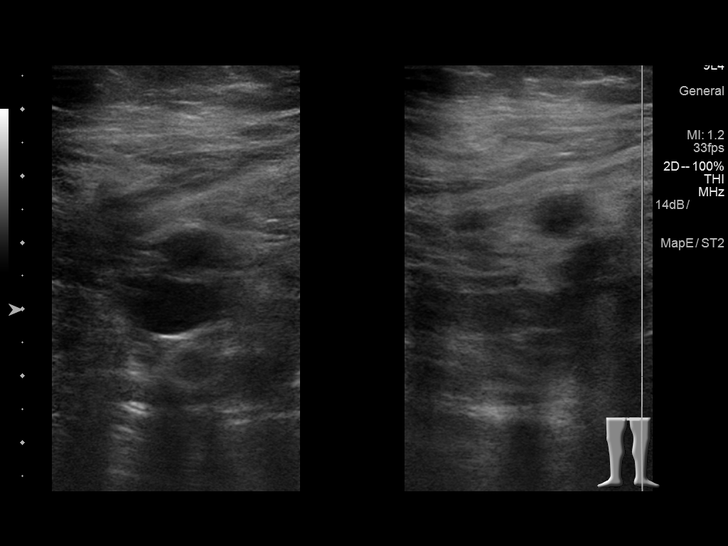
[im 46/62]
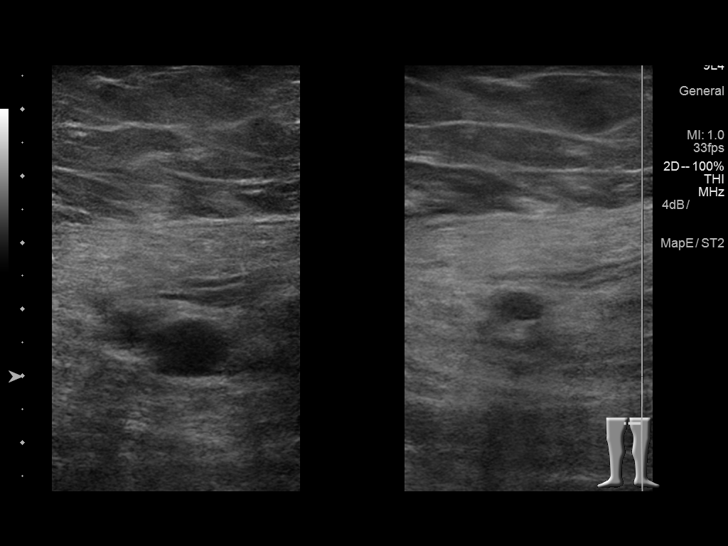
[im 51/62]
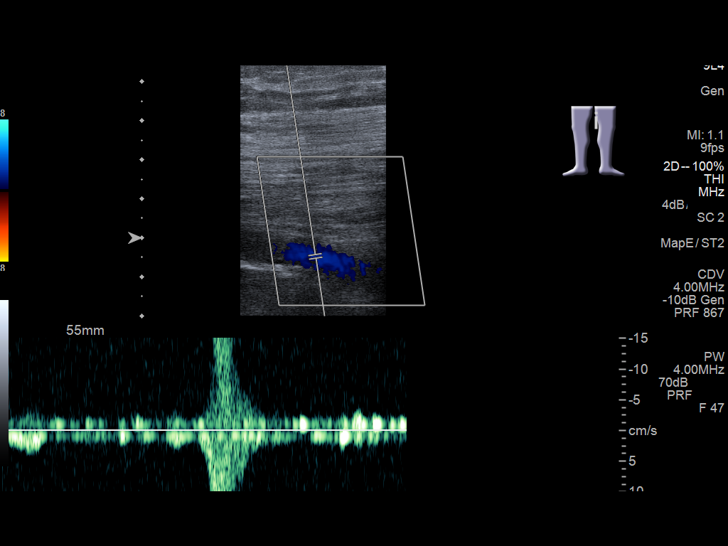
[im 56/62]
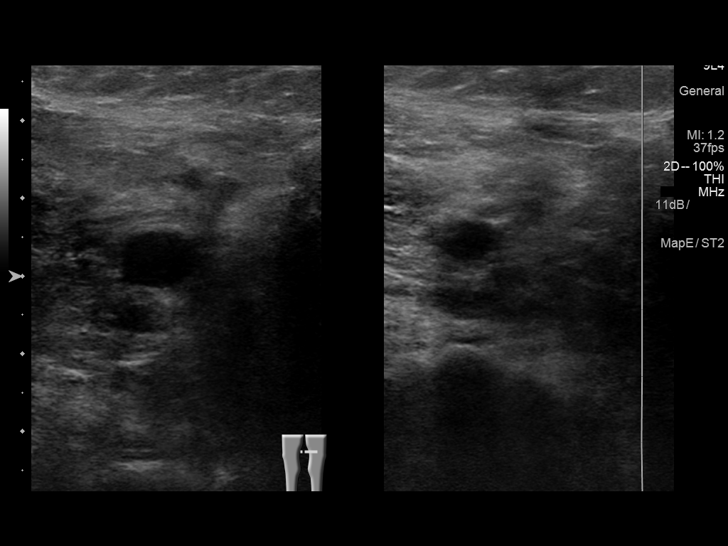
[im 62/62]
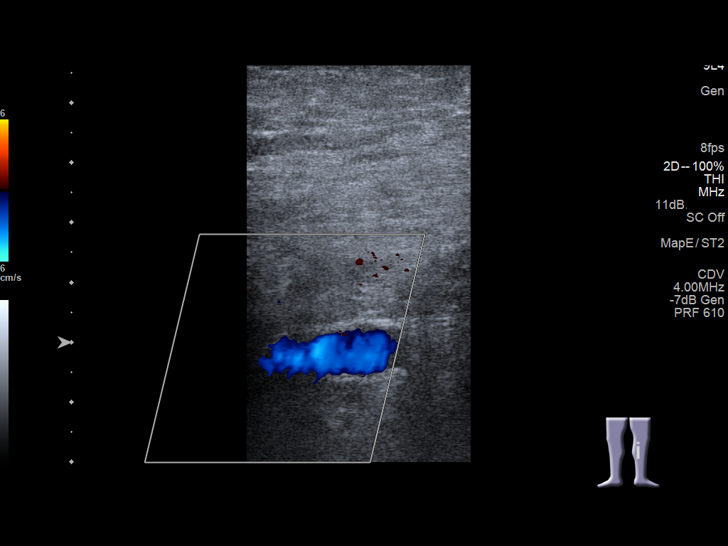

[13 of 24 positions shown; findings below may reference images not displayed]

FINDINGS: RIGHT LOWER EXTREMITY

Common Femoral Vein: No evidence of thrombus. Normal
compressibility, respiratory phasicity and response to augmentation.

Saphenofemoral Junction: No evidence of thrombus. Normal
compressibility and flow on color Doppler imaging.

Profunda Femoral Vein: No evidence of thrombus. Normal
compressibility and flow on color Doppler imaging.

Femoral Vein: No evidence of thrombus. Normal compressibility,
respiratory phasicity and response to augmentation.

Popliteal Vein: No evidence of thrombus. Normal compressibility,
respiratory phasicity and response to augmentation.

Calf Veins: No evidence of thrombus. Normal compressibility and flow
on color Doppler imaging.

Superficial Great Saphenous Vein: No evidence of thrombus. Normal
compressibility and flow on color Doppler imaging.

Venous Reflux:  None.

Other Findings:  None.

LEFT LOWER EXTREMITY

Common Femoral Vein: No evidence of thrombus. Normal
compressibility, respiratory phasicity and response to augmentation.

Saphenofemoral Junction: No evidence of thrombus. Normal
compressibility and flow on color Doppler imaging.

Profunda Femoral Vein: No evidence of thrombus. Normal
compressibility and flow on color Doppler imaging.

Femoral Vein: No evidence of thrombus. Normal compressibility,
respiratory phasicity and response to augmentation.

Popliteal Vein: No evidence of thrombus. Normal compressibility,
respiratory phasicity and response to augmentation.

Calf Veins: No evidence of thrombus. Normal compressibility and flow
on color Doppler imaging.

Superficial Great Saphenous Vein: No evidence of thrombus. Normal
compressibility and flow on color Doppler imaging.

Venous Reflux:  None.

Other Findings:  None.
IMPRESSION: No evidence of DVT within either lower extremity.
